# Patient Record
Sex: Female | Born: 1970 | Hispanic: Yes
Health system: Midwestern US, Academic
[De-identification: ages and names within clinical notes are randomized; demographics above are authoritative.]

---

## 2016-06-04 MED ORDER — LEVOTHYROXINE 150 MCG PO TAB
ORAL_TABLET | ORAL | 1 refills | 30.00000 days | Status: AC
Start: 2016-06-04 — End: ?

## 2016-08-07 ENCOUNTER — Encounter: Admit: 2016-08-07 | Discharge: 2016-08-07 | Payer: Medicaid Other

## 2016-08-07 ENCOUNTER — Encounter: Admit: 2016-08-07 | Discharge: 2016-08-07 | Payer: MEDICAID

## 2016-08-07 DIAGNOSIS — S0990XA Unspecified injury of head, initial encounter: ICD-10-CM

## 2016-08-07 DIAGNOSIS — E663 Overweight: ICD-10-CM

## 2016-08-07 DIAGNOSIS — N920 Excessive and frequent menstruation with regular cycle: ICD-10-CM

## 2016-08-07 DIAGNOSIS — M549 Dorsalgia, unspecified: ICD-10-CM

## 2016-08-07 DIAGNOSIS — C801 Malignant (primary) neoplasm, unspecified: ICD-10-CM

## 2016-08-07 DIAGNOSIS — Q0700 Arnold-Chiari syndrome without spina bifida or hydrocephalus: ICD-10-CM

## 2016-08-07 DIAGNOSIS — G935 Compression of brain: ICD-10-CM

## 2016-08-07 DIAGNOSIS — R002 Palpitations: ICD-10-CM

## 2016-08-07 DIAGNOSIS — Z832 Family history of diseases of the blood and blood-forming organs and certain disorders involving the immune mechanism: ICD-10-CM

## 2016-08-07 DIAGNOSIS — R51 Headache: ICD-10-CM

## 2016-08-07 DIAGNOSIS — D699 Hemorrhagic condition, unspecified: Principal | ICD-10-CM

## 2016-08-07 DIAGNOSIS — M797 Fibromyalgia: ICD-10-CM

## 2016-08-07 DIAGNOSIS — D7589 Other specified diseases of blood and blood-forming organs: ICD-10-CM

## 2016-08-07 DIAGNOSIS — I82409 Acute embolism and thrombosis of unspecified deep veins of unspecified lower extremity: ICD-10-CM

## 2016-08-07 DIAGNOSIS — K219 Gastro-esophageal reflux disease without esophagitis: ICD-10-CM

## 2016-08-07 DIAGNOSIS — M479 Spondylosis, unspecified: ICD-10-CM

## 2016-08-07 DIAGNOSIS — R52 Pain, unspecified: Principal | ICD-10-CM

## 2016-08-07 DIAGNOSIS — G43909 Migraine, unspecified, not intractable, without status migrainosus: ICD-10-CM

## 2016-08-07 DIAGNOSIS — R079 Chest pain, unspecified: ICD-10-CM

## 2016-08-07 DIAGNOSIS — J45909 Unspecified asthma, uncomplicated: ICD-10-CM

## 2016-08-07 DIAGNOSIS — F419 Anxiety disorder, unspecified: ICD-10-CM

## 2016-08-07 DIAGNOSIS — M1990 Unspecified osteoarthritis, unspecified site: ICD-10-CM

## 2016-08-07 LAB — COMPREHENSIVE METABOLIC PANEL
Lab: 0.4 mg/dL (ref 0.3–1.2)
Lab: 0.8 mg/dL (ref 0.4–1.00)
Lab: 10 mg/dL (ref 8.5–10.6)
Lab: 106 MMOL/L (ref 98–110)
Lab: 13 mg/dL (ref 7–25)
Lab: 14 U/L (ref 7–56)
Lab: 140 MMOL/L (ref 137–147)
Lab: 18 U/L (ref 7–40)
Lab: 29 MMOL/L (ref 21–30)
Lab: 3.6 MMOL/L (ref 3.5–5.1)
Lab: 4.3 g/dL (ref 3.5–5.0)
Lab: 5 10*3/uL (ref 3–12)
Lab: 66 U/L — ABNORMAL HIGH (ref 25–110)
Lab: 83 mg/dL (ref 70–100)
Lab: >60 mL/min (ref >60)
Lab: >60 mL/min — ABNORMAL HIGH (ref >60)

## 2016-08-07 LAB — FIBRINOGEN: Lab: 355 mg/dL (ref 200–400)

## 2016-08-07 LAB — CBC AND DIFF
Lab: 13 g/dL (ref 12.0–15.0)
Lab: 4.6 M/UL (ref 4.0–5.0)
Lab: 8.6 10*3/uL (ref 4.5–11.0)

## 2016-08-07 NOTE — Progress Notes
Name: Tina [name]          MRN: [id number]      DOB: Apr 20, 1970      AGE: 46 y.o.   DATE OF SERVICE: 08/07/2016    Subjective:             Reason for Visit:  Heme/Onc Care      Tina [name] is a 46 y.o. female.     Cancer Staging  No matching staging information was found for the patient.    History of Present Illness  Patient with possible bleeding disorder.  The PT, PTT levels done at the outside lab were within normal range.  Fibrinogen was raised. Screening for von Willebrand'sdisease and factor VIII activity are normal as is the platelet functional assay.  In addition, based on prior evaluation of antibodies to EBV that were positive and based on her current symptoms, we did check the EBV DNA activity and this is negative.  Comes today to make further determinations to evaluate for bleeding disorder.  ???       Review of Systems  GENERAL: No fevers, chills or night sweats.  Good appetite, no weight loss.  HEENT: No mouth sores ulcers or pain or problems with swallowing.  RESPIRATORY: No cough or chest wall pain or hemoptysis. Has asthma - controlled  CVS: No angina or orthopnea or PND.  No significant exertional dyspnea and no palpitations.  GI: No nausea or vomiting, diarrhea or constipation, melena or hematochezia or abdominal pain   GU: No burning on micturition, frequency or hematuria  MUSCULOSKELETAL: Various muscle aches and pains, spasms.  INTEGUMENT: Has problems with repeated episodes of bruising and brings pictures of these bruises that she has taken on the phone. Has not had any development of rashes.  CNS: Does have headaches.  Has had surgery for Budd-Chiari syndrome  ADDITIONAL: She has a significant history of in breathing with family and apparently mother and father with first cousins.  She has episodes of repeated epistaxes.  Does not have any significant gum bleeding or bleeding from any other sites such as the gastrointestinal tract or vaginal bleeding.  The extensive bruising episodes that she has on unprovoked.    Objective:         ??? baclofen (LIORESAL) 20 mg tablet Take 20 mg by mouth three times daily.   ??? carvedilol (COREG) 6.25 mg tablet Take 6.25 mg by mouth twice daily with meals. Take with food.   ??? COQ10 (UBIQUINOL) PO Take  by mouth daily.   ??? Diethylpropion 75 mg TbER Take 1 tablet by mouth daily.   ??? EPINEPHrine(+) (EPIPEN) 1 mg/mL injection pen Inject 0.3 mg to area(s) as directed once as needed.   ??? furosemide (LASIX) 40 mg tablet Take 20 mg by mouth twice daily.   ??? hydroCHLOROthiazide (HYDRODIURIL) 25 mg tablet Take 25 mg by mouth every morning.   ??? IBUPROFEN PO Take  by mouth as Needed.   ??? irbesartan (AVAPRO) 300 mg tablet Take 300 mg by mouth at bedtime daily.   ??? KRILL/OM3/DHA/EPA/OM6/LIP/ASTX (KRILL OIL (OMEGA 3 AND 6) PO) Take  by mouth daily.   ??? levothyroxine (SYNTHROID) 150 mcg tablet TAKE 1 TABLET BY MOUTH 6 TIMES PER WEEK TAKE ON EMPTY STOMACH NOT WITH MEDS/FOOD/DRINK   ??? metFORMIN (GLUCOPHAGE) 500 mg tablet Take 500 mg by mouth twice daily with meals.   ??? MULTIVITAMIN PO Take  by mouth daily.   ??? naproxen (NAPROSYN) 500 mg tablet Take 500  mg by mouth at bedtime daily. Take with food.   ??? ondansetron (ZOFRAN ODT) 8 mg rapid dissolve tablet Dissolve 8 mg by mouth as Needed for Nausea or Vomiting. Place on tongue to disolve.   ??? promethazine (PHENERGAN) 25 mg tablet Take 25 mg by mouth every 6 hours as needed for Nausea or Vomiting.   ??? simvastatin (ZOCOR) 40 mg tablet Take 40 mg by mouth at bedtime daily.   ??? tamsulosin (FLOMAX) 0.4 mg capsule Take 0.4 mg by mouth daily. Do not crush, chew or open capsules. Take 30 minutes following the same meal each day.   ??? VENTOLIN HFA 90 mcg/actuation inhaler Take 2 puffs by mouth as Needed.     Vitals:    08/07/16 1421   BP: 110/74   Pulse: 65   Resp: 16   Temp: 36.4 ???C (97.5 ???F)   TempSrc: Oral   SpO2: 96%   Weight: 115.6 kg (254 lb 12.8 oz)   Height: 170.2 cm (67) Body mass index is 39.91 kg/m???.     Pain Score: Zero         Pain Addressed:  N/A    Patient Evaluated for a Clinical Trial: No treatment clinical trial available for this patient.     [place] Cooperative Oncology Group performance status is 1, Restricted in physically strenuous activity but ambulatory and able to carry out work of a light or sedentary nature, e.g., light house work, office work.       Physical Exam    GENERAL: Obese, relatively well-appearing patient, no cyanosis, clubbing, edema or jaundice.  HEENT: Mouth and throat are clear, trachea is central no thyromegaly or JVD.  CHEST: Good breath sounds, no crackles, effusions or bronchospasm.  No pleural rub.  CARDIOVASCULAR: No murmurs, rubs or gallops.  Normal heart sounds.  ABDOMEN: No hepatosplenomegaly or tenderness or guarding or other masses.  INTEGUMENTARY: No skin lesions noted.  Has a significant ecchymoses of the right palm.  Has tattoos on the back.  LYMPHOID SYSTEM: No lymphadenopathy in any of the lymph node bearing areas.  CNS: No obvious cranial nerve lesions, she is fully orientated and moves about normally and gets up and down off the exam table with ease.  MUSCULOSKELETAL: No obvious abnormalities noted            Assessment and Plan:  46 year old woman with history of unprovoked ecchymoses/bruising.  She has a strong history of in breathing within the family.  By history she claims that her father and one other family members on the father's had increased bleeding propensity problem does not know anything further about this.  We will plan to send citrated blood for studies of coagulation factors.. It is possible that she has 1 of the rare coagulation factors abnormalities.

## 2016-08-08 LAB — FACTOR 2 ASSAY: Lab: 122 % (ref 50–150)

## 2016-08-08 LAB — FACTOR 5 ASSAY: Lab: 133 % (ref 50–150)

## 2016-08-08 LAB — FACTOR 9 ASSAY: Lab: 120 % (ref 50–150)

## 2016-08-08 LAB — FACTOR 7 ASSAY: Lab: 134 % (ref 50–150)

## 2016-08-08 LAB — FACTOR 11 ASSAY: Lab: 123 % (ref 50–150)

## 2016-08-08 LAB — FACTOR 10 ASSAY: Lab: 127 % — ABNORMAL LOW (ref >60)

## 2016-08-08 LAB — FACTOR 8 ASSAY: Lab: 133 % (ref 50–150)

## 2016-08-09 LAB — MISC REFERENCE TEST

## 2016-08-09 LAB — ALPHA2 ANTIPLASMIN

## 2016-08-13 LAB — MISC ARUP TEST: Lab: 9

## 2016-08-14 LAB — FACTOR 13 ANTIGEN: Lab: 122 % (ref >60)

## 2016-08-15 LAB — MISC ARUP TEST: Lab: 9

## 2016-09-29 ENCOUNTER — Encounter: Admit: 2016-09-29 | Discharge: 2016-09-29 | Payer: Medicaid Other

## 2016-10-22 ENCOUNTER — Encounter: Admit: 2016-10-22 | Discharge: 2016-10-22 | Payer: Medicaid Other

## 2016-10-22 DIAGNOSIS — R238 Other skin changes: Principal | ICD-10-CM

## 2016-10-23 ENCOUNTER — Encounter: Admit: 2016-10-23 | Discharge: 2016-10-23 | Payer: Medicaid Other

## 2016-10-23 ENCOUNTER — Encounter: Admit: 2016-10-23 | Discharge: 2016-10-23 | Payer: MEDICAID

## 2016-10-23 DIAGNOSIS — M1990 Unspecified osteoarthritis, unspecified site: ICD-10-CM

## 2016-10-23 DIAGNOSIS — R52 Pain, unspecified: Principal | ICD-10-CM

## 2016-10-23 DIAGNOSIS — M479 Spondylosis, unspecified: ICD-10-CM

## 2016-10-23 DIAGNOSIS — R079 Chest pain, unspecified: ICD-10-CM

## 2016-10-23 DIAGNOSIS — E6609 Other obesity due to excess calories: ICD-10-CM

## 2016-10-23 DIAGNOSIS — G935 Compression of brain: ICD-10-CM

## 2016-10-23 DIAGNOSIS — N920 Excessive and frequent menstruation with regular cycle: ICD-10-CM

## 2016-10-23 DIAGNOSIS — F419 Anxiety disorder, unspecified: ICD-10-CM

## 2016-10-23 DIAGNOSIS — G43909 Migraine, unspecified, not intractable, without status migrainosus: ICD-10-CM

## 2016-10-23 DIAGNOSIS — Z862 Personal history of diseases of the blood and blood-forming organs and certain disorders involving the immune mechanism: Principal | ICD-10-CM

## 2016-10-23 DIAGNOSIS — Z6836 Body mass index (BMI) 36.0-36.9, adult: ICD-10-CM

## 2016-10-23 DIAGNOSIS — M549 Dorsalgia, unspecified: ICD-10-CM

## 2016-10-23 DIAGNOSIS — C801 Malignant (primary) neoplasm, unspecified: ICD-10-CM

## 2016-10-23 DIAGNOSIS — E663 Overweight: ICD-10-CM

## 2016-10-23 DIAGNOSIS — R002 Palpitations: ICD-10-CM

## 2016-10-23 DIAGNOSIS — Q0700 Arnold-Chiari syndrome without spina bifida or hydrocephalus: ICD-10-CM

## 2016-10-23 DIAGNOSIS — R238 Other skin changes: ICD-10-CM

## 2016-10-23 DIAGNOSIS — R51 Headache: ICD-10-CM

## 2016-10-23 DIAGNOSIS — K219 Gastro-esophageal reflux disease without esophagitis: ICD-10-CM

## 2016-10-23 DIAGNOSIS — D699 Hemorrhagic condition, unspecified: ICD-10-CM

## 2016-10-23 DIAGNOSIS — M797 Fibromyalgia: ICD-10-CM

## 2016-10-23 DIAGNOSIS — S0990XA Unspecified injury of head, initial encounter: ICD-10-CM

## 2016-10-23 DIAGNOSIS — I82409 Acute embolism and thrombosis of unspecified deep veins of unspecified lower extremity: ICD-10-CM

## 2016-10-23 DIAGNOSIS — J45909 Unspecified asthma, uncomplicated: ICD-10-CM

## 2016-10-23 LAB — CBC AND DIFF
Lab: 0.1 10*3/uL (ref 0–0.20)
Lab: 5 M/UL — ABNORMAL HIGH (ref 4.0–5.0)
Lab: 9.6 10*3/uL (ref 4.5–11.0)

## 2016-10-23 LAB — COMPREHENSIVE METABOLIC PANEL
Lab: 0.6 mg/dL (ref 0.3–1.2)
Lab: 0.9 mg/dL (ref 0.4–1.00)
Lab: 10 mg/dL (ref 8.5–10.6)
Lab: 137 MMOL/L (ref 137–147)
Lab: 15 mg/dL (ref 7–25)
Lab: 3.4 MMOL/L — ABNORMAL LOW (ref 3.5–5.1)
Lab: 4.5 g/dL (ref 3.5–5.0)
Lab: 68 U/L (ref 25–110)
Lab: 73 mg/dL (ref 70–100)
Lab: 8 g/dL (ref 6.0–8.0)
Lab: 96 MMOL/L — ABNORMAL LOW (ref 98–110)

## 2016-10-23 NOTE — Progress Notes
Name: Tina [name]          MRN: [id number]      DOB: 1970/03/11      AGE: 46 y.o.   DATE OF SERVICE: 10/23/2016    Subjective:             Reason for Visit:  Heme/Onc Care      Tina [name] is a 46 y.o. female.     Cancer Staging  No matching staging information was found for the patient.    History of Present Illness  Patient with possible bleeding disorder. ???The PT,???PTT levels done at the outside lab were within normal range. ???Fibrinogen was raised. Screening for von Willebrand's 00  disease???and factor VIII activity have been normal as was the platelet functional assay. ???In addition, based on prior evaluation of antibodies to EBV that were positive and based on her current symptoms, we did check the EBV DNA activity and this was negative. ???Comes today with no new bleeding problems since last seen.  Is attempting to lose weight on the weight loss program.???  ???Results for Tina [name], Tina [name] (MRN [id number]) as of 10/29/2016 15:02   Ref. Range 08/07/2016 15:19 08/07/2016 15:19 08/07/2016 15:33   Fibrinogen Latest Ref Range: 200 - 400 MG/DL   629   Alpha 2-Antiplasmin Latest Ref Range: 80 - 130 % SEE REF LAB RESULT     Factor 2 Latest Ref Range: 50 - 150 %   122   Factor 5 Latest Ref Range: 50 - 150 %   133   Factor 7 Latest Ref Range: 50 - 150 %   134   Factor 8 Latest Ref Range: 50 - 150 %   133   Factor 9 Latest Ref Range: 50 - 150 %   120   Factor 10 Latest Ref Range: 50 - 150 %   127   Factor 11 Latest Ref Range: 50 - 150 % 123     Factor 13 Antigen Latest Ref Range: 62 - 157 %   122.3          Review of Systems  GENERAL: No fevers, chills or night sweats.  Good appetite, lost 30 lbs weight on weight loss program.  She has been offered a gastric sleeve procedure for weight loss.  She has not noted any new bleeding episodes since the last visit.  HEENT: No mouth sores ulcers or pain or problems with swallowing.  RESPIRATORY: No cough or chest wall pain or asthma type symptoms or hemoptysis. CVS: No angina or orthopnea or PND.  No significant exertional dyspnea and no palpitations.  GI: No nausea or vomiting, diarrhea or constipation, melena or hematochezia or abdominal pain   INTEGUMENT:  not had development of rashes.  CNS: No headaches or visual changes any positive symptoms.          Objective:         ??? EPINEPHrine(+) (EPIPEN) 1 mg/mL injection pen Inject 0.3 mg to area(s) as directed once as needed.   ??? furosemide (LASIX) 40 mg tablet Take 20 mg by mouth twice daily.   ??? levothyroxine (SYNTHROID) 150 mcg tablet TAKE 1 TABLET BY MOUTH 6 TIMES PER WEEK TAKE ON EMPTY STOMACH NOT WITH MEDS/FOOD/DRINK   ??? metFORMIN (GLUCOPHAGE) 500 mg tablet Take 500 mg by mouth twice daily with meals.   ??? ondansetron (ZOFRAN ODT) 8 mg rapid dissolve tablet Dissolve 8 mg by mouth as Needed for Nausea or Vomiting. Place on tongue to disolve.   ???  VENTOLIN HFA 90 mcg/actuation inhaler Take 2 puffs by mouth as Needed.     Vitals:    10/23/16 1509   BP: 118/67   Pulse: 85   Resp: 18   Temp: 36.7 ???C (98 ???F)   TempSrc: Oral   SpO2: 94%   Weight: 105.4 kg (232 lb 6.4 oz)   Height: 170.2 cm (67)     Body mass index is 36.4 kg/m???.     Pain Score: Zero         Pain Addressed:  N/A    Patient Evaluated for a Clinical Trial: No treatment clinical trial available for this patient.     [place] Cooperative Oncology Group performance status is 0, Fully active, able to carry on all pre-disease performance without restriction.Marland Kitchen       Physical Exam    GENERAL: Well-appearing patient, no cyanosis, clubbing, edema or jaundice. Obese.  HEENT: Mouth and throat are clear, trachea is central no thyromegaly or JVD.  CHEST: Good breath sounds, no crackles, effusions or bronchospasm.  No pleural rub.  CARDIOVASCULAR: No murmurs, rubs or gallops.  Normal heart sounds.  ABDOMEN: No hepatosplenomegaly or tenderness or guarding or other masses.  INTEGUMENTARY: No skin lesions noted. LYMPHOID SYSTEM: No lymphadenopathy in any of the lymph node bearing areas.  CNS: No obvious cranial nerve lesions, he is fully orientated and moves about normally and gets up and down off the exam table with ease.  MUSCULOSKELETAL: No obvious changes       Results for Tina, [name] (MRN [id number]) as of 10/23/2016 15:37   Ref. Range 08/07/2016 13:52 08/07/2016 15:19 08/07/2016 15:19 08/07/2016 15:33 10/23/2016 14:13   Hemoglobin Latest Ref Range: 12.0 - 15.0 GM/DL 45.4    09.8   Hematocrit Latest Ref Range: 36 - 45 % 39.5    42.6   Platelet Count Latest Ref Range: 150 - 400 K/UL 273    313   White Blood Cells Latest Ref Range: 4.5 - 11.0 K/UL 8.6    9.6   Neutrophils Latest Ref Range: 41 - 77 % 54    60   Absolute Neutrophil Count Latest Ref Range: 1.8 - 7.0 K/UL 4.70    5.80   Lymphocytes Latest Ref Range: 24 - 44 % 34    30   Absolute Lymph Count Latest Ref Range: 1.0 - 4.8 K/UL 2.90    2.90   Monocytes Latest Ref Range: 4 - 12 % 5    6   Absolute Monocyte Count Latest Ref Range: 0 - 0.80 K/UL 0.50    0.60   Eosinophils Latest Ref Range: 0 - 5 % 6 (H)    3   Absolute Eosinophil Count Latest Ref Range: 0 - 0.45 K/UL 0.50 (H)    0.20   Absolute Basophil Count Latest Ref Range: 0 - 0.20 K/UL 0.10    0.10   Basophils Latest Ref Range: 0 - 2 % 1    1   RBC Latest Ref Range: 4.0 - 5.0 M/UL 4.64    5.04 (H)   MCV Latest Ref Range: 80 - 100 FL 85.0    84.5   MCH Latest Ref Range: 26 - 34 PG 29.4    29.7   MCHC Latest Ref Range: 32.0 - 36.0 G/DL 11.9    14.7   MPV Latest Ref Range: 7 - 11 FL 8.3    8.4   RDW Latest Ref Range: 11 - 15 % 13.5    15.0  Fibrinogen Latest Ref Range: 200 - 400 MG/DL    213    Alpha 2-Antiplasmin Latest Ref Range: 80 - 130 %  SEE REF LAB RESULT      Factor 2 Latest Ref Range: 50 - 150 %    122    Factor 5 Latest Ref Range: 50 - 150 %    133    Factor 7 Latest Ref Range: 50 - 150 %    134    Factor 8 Latest Ref Range: 50 - 150 %    133    Factor 9 Latest Ref Range: 50 - 150 %    120 Factor 10 Latest Ref Range: 50 - 150 %    127    Factor 11 Latest Ref Range: 50 - 150 %  123      Factor 13 Antigen Latest Ref Range: 62 - 157 %    122.3    Sodium Latest Ref Range: 137 - 147 MMOL/L 140    137   Potassium Latest Ref Range: 3.5 - 5.1 MMOL/L 3.6    3.4 (L)   Chloride Latest Ref Range: 98 - 110 MMOL/L 106    96 (L)   CO2 Latest Ref Range: 21 - 30 MMOL/L 29    30   Anion Gap Latest Ref Range: 3 - 12  5    11    Blood Urea Nitrogen Latest Ref Range: 7 - 25 MG/DL 13    15   Creatinine Latest Ref Range: 0.4 - 1.00 MG/DL 0.86    5.78   eGFR Non African American Latest Ref Range: >60 mL/min >60    >60   eGFR African American Latest Ref Range: >60 mL/min >60    >60   Glucose Latest Ref Range: 70 - 100 MG/DL 83    73   Albumin Latest Ref Range: 3.5 - 5.0 G/DL 4.3    4.5   Calcium Latest Ref Range: 8.5 - 10.6 MG/DL 46.9    62.9   Total Bilirubin Latest Ref Range: 0.3 - 1.2 MG/DL 0.4    0.6   Total Protein Latest Ref Range: 6.0 - 8.0 G/DL 7.5    8.0   AST (SGOT) Latest Ref Range: 7 - 40 U/L 18    19   ALT (SGPT) Latest Ref Range: 7 - 56 U/L 14    15   Alk Phosphatase Latest Ref Range: 25 - 110 U/L 66    68        Assessment and Plan:  Patient with the bleeding history although there is no clear family history obtainable.  At this time, since they have not been any new bleeding episodes and up till this evaluation labs for various clotting factors are all within the normal range, will elect to merely observe her and should she have further episodes of bleeding we will have to evaluate the fibrinolytic pathway and other less common pathways including platelets aggregation studies.  Patient understands and I did not give her a return appointment unless she has development of new bleeding problems.

## 2016-12-09 ENCOUNTER — Encounter: Admit: 2016-12-09 | Discharge: 2016-12-09 | Payer: Medicaid Other

## 2016-12-09 DIAGNOSIS — E039 Hypothyroidism, unspecified: Principal | ICD-10-CM

## 2016-12-10 MED ORDER — LEVOTHYROXINE 150 MCG PO TAB
ORAL_TABLET | 1 refills
Start: 2016-12-10 — End: ?

## 2016-12-12 ENCOUNTER — Encounter: Admit: 2016-12-12 | Discharge: 2016-12-12 | Payer: Medicaid Other

## 2016-12-12 DIAGNOSIS — E039 Hypothyroidism, unspecified: Principal | ICD-10-CM

## 2016-12-13 MED ORDER — LEVOTHYROXINE 150 MCG PO TAB
ORAL_TABLET | 1 refills
Start: 2016-12-13 — End: ?

## 2016-12-31 ENCOUNTER — Encounter: Admit: 2016-12-31 | Discharge: 2016-12-31 | Payer: Medicaid Other

## 2017-05-16 ENCOUNTER — Encounter: Admit: 2017-05-16 | Discharge: 2017-05-16 | Payer: Medicaid Other

## 2017-06-06 ENCOUNTER — Encounter: Admit: 2017-06-06 | Discharge: 2017-06-06 | Payer: Medicaid Other

## 2017-06-06 DIAGNOSIS — G43909 Migraine, unspecified, not intractable, without status migrainosus: ICD-10-CM

## 2017-06-06 DIAGNOSIS — M549 Dorsalgia, unspecified: ICD-10-CM

## 2017-06-06 DIAGNOSIS — I82409 Acute embolism and thrombosis of unspecified deep veins of unspecified lower extremity: ICD-10-CM

## 2017-06-06 DIAGNOSIS — S0990XA Unspecified injury of head, initial encounter: ICD-10-CM

## 2017-06-06 DIAGNOSIS — K219 Gastro-esophageal reflux disease without esophagitis: ICD-10-CM

## 2017-06-06 DIAGNOSIS — N920 Excessive and frequent menstruation with regular cycle: ICD-10-CM

## 2017-06-06 DIAGNOSIS — M1990 Unspecified osteoarthritis, unspecified site: ICD-10-CM

## 2017-06-06 DIAGNOSIS — M797 Fibromyalgia: ICD-10-CM

## 2017-06-06 DIAGNOSIS — R51 Headache: ICD-10-CM

## 2017-06-06 DIAGNOSIS — G935 Compression of brain: ICD-10-CM

## 2017-06-06 DIAGNOSIS — R079 Chest pain, unspecified: ICD-10-CM

## 2017-06-06 DIAGNOSIS — R52 Pain, unspecified: Principal | ICD-10-CM

## 2017-06-06 DIAGNOSIS — F419 Anxiety disorder, unspecified: ICD-10-CM

## 2017-06-06 DIAGNOSIS — C801 Malignant (primary) neoplasm, unspecified: ICD-10-CM

## 2017-06-06 DIAGNOSIS — J45909 Unspecified asthma, uncomplicated: ICD-10-CM

## 2017-06-06 DIAGNOSIS — M479 Spondylosis, unspecified: ICD-10-CM

## 2017-06-06 DIAGNOSIS — E663 Overweight: ICD-10-CM

## 2017-06-06 DIAGNOSIS — Q0700 Arnold-Chiari syndrome without spina bifida or hydrocephalus: ICD-10-CM

## 2017-06-06 DIAGNOSIS — R002 Palpitations: ICD-10-CM

## 2017-06-25 ENCOUNTER — Ambulatory Visit: Admit: 2017-06-25 | Discharge: 2017-06-25 | Payer: Medicaid Other

## 2017-06-25 ENCOUNTER — Encounter: Admit: 2017-06-25 | Discharge: 2017-06-25 | Payer: Medicaid Other

## 2017-06-25 DIAGNOSIS — C801 Malignant (primary) neoplasm, unspecified: ICD-10-CM

## 2017-06-25 DIAGNOSIS — G43909 Migraine, unspecified, not intractable, without status migrainosus: ICD-10-CM

## 2017-06-25 DIAGNOSIS — R079 Chest pain, unspecified: ICD-10-CM

## 2017-06-25 DIAGNOSIS — G935 Compression of brain: ICD-10-CM

## 2017-06-25 DIAGNOSIS — E663 Overweight: ICD-10-CM

## 2017-06-25 DIAGNOSIS — M549 Dorsalgia, unspecified: ICD-10-CM

## 2017-06-25 DIAGNOSIS — R51 Headache: ICD-10-CM

## 2017-06-25 DIAGNOSIS — N920 Excessive and frequent menstruation with regular cycle: ICD-10-CM

## 2017-06-25 DIAGNOSIS — M1990 Unspecified osteoarthritis, unspecified site: ICD-10-CM

## 2017-06-25 DIAGNOSIS — I82409 Acute embolism and thrombosis of unspecified deep veins of unspecified lower extremity: ICD-10-CM

## 2017-06-25 DIAGNOSIS — K219 Gastro-esophageal reflux disease without esophagitis: ICD-10-CM

## 2017-06-25 DIAGNOSIS — R52 Pain, unspecified: Principal | ICD-10-CM

## 2017-06-25 DIAGNOSIS — Q0700 Arnold-Chiari syndrome without spina bifida or hydrocephalus: ICD-10-CM

## 2017-06-25 DIAGNOSIS — J45909 Unspecified asthma, uncomplicated: ICD-10-CM

## 2017-06-25 DIAGNOSIS — F419 Anxiety disorder, unspecified: ICD-10-CM

## 2017-06-25 DIAGNOSIS — R002 Palpitations: ICD-10-CM

## 2017-06-25 DIAGNOSIS — M797 Fibromyalgia: ICD-10-CM

## 2017-06-25 DIAGNOSIS — E890 Postprocedural hypothyroidism: Principal | ICD-10-CM

## 2017-06-25 DIAGNOSIS — M479 Spondylosis, unspecified: ICD-10-CM

## 2017-06-25 DIAGNOSIS — S0990XA Unspecified injury of head, initial encounter: ICD-10-CM

## 2017-06-25 LAB — COMPREHENSIVE METABOLIC PANEL
Lab: 0.5 mg/dL (ref 0.3–1.2)
Lab: 0.7 mg/dL (ref 0.4–1.00)
Lab: 139 MMOL/L (ref 137–147)
Lab: 19 U/L (ref 7–56)
Lab: 23 U/L (ref 7–40)
Lab: 3.3 MMOL/L — ABNORMAL LOW (ref 3.5–5.1)
Lab: 30 MMOL/L (ref 21–30)
Lab: 4.5 g/dL (ref 3.5–5.0)
Lab: 7.5 g/dL (ref 6.0–8.0)
Lab: 73 U/L (ref 25–110)
Lab: 85 mg/dL (ref 70–100)
Lab: 9.9 mg/dL (ref 8.5–10.6)
Lab: >60 mL/min (ref >60)
Lab: >60 mL/min (ref >60)
Lab: 7 (ref 3–12)

## 2017-06-25 LAB — FREE T4 (FREE THYROXINE) ONLY: Lab: 1.4 ng/dL (ref 0.6–1.6)

## 2017-06-25 LAB — THYROID STIMULATING HORMONE-TSH: Lab: 0.8 uU/mL (ref 0.35–5.00)

## 2017-06-26 LAB — 25-OH VITAMIN D (D2 + D3): Lab: 33 ng/mL (ref 30–80)

## 2017-06-27 ENCOUNTER — Encounter: Admit: 2017-06-27 | Discharge: 2017-06-27 | Payer: Medicaid Other

## 2017-07-10 ENCOUNTER — Ambulatory Visit: Admit: 2017-07-10 | Discharge: 2017-07-10 | Payer: Medicaid Other

## 2017-07-10 ENCOUNTER — Encounter: Admit: 2017-07-10 | Discharge: 2017-07-10 | Payer: Medicaid Other

## 2017-07-10 DIAGNOSIS — M1990 Unspecified osteoarthritis, unspecified site: ICD-10-CM

## 2017-07-10 DIAGNOSIS — R002 Palpitations: ICD-10-CM

## 2017-07-10 DIAGNOSIS — S0990XA Unspecified injury of head, initial encounter: ICD-10-CM

## 2017-07-10 DIAGNOSIS — R079 Chest pain, unspecified: ICD-10-CM

## 2017-07-10 DIAGNOSIS — R52 Pain, unspecified: Principal | ICD-10-CM

## 2017-07-10 DIAGNOSIS — F419 Anxiety disorder, unspecified: ICD-10-CM

## 2017-07-10 DIAGNOSIS — I82409 Acute embolism and thrombosis of unspecified deep veins of unspecified lower extremity: ICD-10-CM

## 2017-07-10 DIAGNOSIS — H903 Sensorineural hearing loss, bilateral: Principal | ICD-10-CM

## 2017-07-10 DIAGNOSIS — G43909 Migraine, unspecified, not intractable, without status migrainosus: ICD-10-CM

## 2017-07-10 DIAGNOSIS — K219 Gastro-esophageal reflux disease without esophagitis: ICD-10-CM

## 2017-07-10 DIAGNOSIS — M549 Dorsalgia, unspecified: ICD-10-CM

## 2017-07-10 DIAGNOSIS — J45909 Unspecified asthma, uncomplicated: ICD-10-CM

## 2017-07-10 DIAGNOSIS — R51 Headache: ICD-10-CM

## 2017-07-10 DIAGNOSIS — G935 Compression of brain: ICD-10-CM

## 2017-07-10 DIAGNOSIS — M479 Spondylosis, unspecified: ICD-10-CM

## 2017-07-10 DIAGNOSIS — Q0700 Arnold-Chiari syndrome without spina bifida or hydrocephalus: ICD-10-CM

## 2017-07-10 DIAGNOSIS — C801 Malignant (primary) neoplasm, unspecified: ICD-10-CM

## 2017-07-10 DIAGNOSIS — E663 Overweight: ICD-10-CM

## 2017-07-10 DIAGNOSIS — M797 Fibromyalgia: ICD-10-CM

## 2017-07-10 DIAGNOSIS — N920 Excessive and frequent menstruation with regular cycle: ICD-10-CM

## 2017-07-19 ENCOUNTER — Encounter: Admit: 2017-07-19 | Discharge: 2017-07-19 | Payer: Medicaid Other

## 2017-09-02 ENCOUNTER — Ambulatory Visit: Admit: 2017-09-02 | Discharge: 2017-09-03 | Payer: Medicaid Other

## 2017-09-02 ENCOUNTER — Encounter: Admit: 2017-09-02 | Discharge: 2017-09-02 | Payer: Medicaid Other

## 2017-09-02 DIAGNOSIS — F419 Anxiety disorder, unspecified: ICD-10-CM

## 2017-09-02 DIAGNOSIS — S0990XA Unspecified injury of head, initial encounter: ICD-10-CM

## 2017-09-02 DIAGNOSIS — M479 Spondylosis, unspecified: ICD-10-CM

## 2017-09-02 DIAGNOSIS — R52 Pain, unspecified: Principal | ICD-10-CM

## 2017-09-02 DIAGNOSIS — C801 Malignant (primary) neoplasm, unspecified: ICD-10-CM

## 2017-09-02 DIAGNOSIS — E663 Overweight: ICD-10-CM

## 2017-09-02 DIAGNOSIS — M549 Dorsalgia, unspecified: ICD-10-CM

## 2017-09-02 DIAGNOSIS — I82409 Acute embolism and thrombosis of unspecified deep veins of unspecified lower extremity: ICD-10-CM

## 2017-09-02 DIAGNOSIS — G935 Compression of brain: ICD-10-CM

## 2017-09-02 DIAGNOSIS — R1314 Dysphagia, pharyngoesophageal phase: Principal | ICD-10-CM

## 2017-09-02 DIAGNOSIS — R490 Dysphonia: Secondary | ICD-10-CM

## 2017-09-02 DIAGNOSIS — M1990 Unspecified osteoarthritis, unspecified site: ICD-10-CM

## 2017-09-02 DIAGNOSIS — R51 Headache: ICD-10-CM

## 2017-09-02 DIAGNOSIS — M797 Fibromyalgia: ICD-10-CM

## 2017-09-02 DIAGNOSIS — K219 Gastro-esophageal reflux disease without esophagitis: ICD-10-CM

## 2017-09-02 DIAGNOSIS — R002 Palpitations: ICD-10-CM

## 2017-09-02 DIAGNOSIS — N920 Excessive and frequent menstruation with regular cycle: ICD-10-CM

## 2017-09-02 DIAGNOSIS — J45909 Unspecified asthma, uncomplicated: ICD-10-CM

## 2017-09-02 DIAGNOSIS — G43909 Migraine, unspecified, not intractable, without status migrainosus: ICD-10-CM

## 2017-09-02 DIAGNOSIS — Q0700 Arnold-Chiari syndrome without spina bifida or hydrocephalus: ICD-10-CM

## 2017-09-02 DIAGNOSIS — R079 Chest pain, unspecified: ICD-10-CM

## 2017-09-03 ENCOUNTER — Ambulatory Visit: Admit: 2017-09-02 | Discharge: 2017-09-02 | Payer: Medicaid Other

## 2017-09-03 DIAGNOSIS — J387 Other diseases of larynx: ICD-10-CM

## 2017-09-03 DIAGNOSIS — R490 Dysphonia: ICD-10-CM

## 2017-09-03 DIAGNOSIS — R1314 Dysphagia, pharyngoesophageal phase: Principal | ICD-10-CM

## 2017-09-06 ENCOUNTER — Encounter: Admit: 2017-09-06 | Discharge: 2017-09-06 | Payer: Medicaid Other

## 2017-09-16 ENCOUNTER — Encounter: Admit: 2017-09-16 | Discharge: 2017-09-16 | Payer: Medicaid Other

## 2017-09-16 DIAGNOSIS — R002 Palpitations: ICD-10-CM

## 2017-09-16 DIAGNOSIS — R238 Other skin changes: Principal | ICD-10-CM

## 2017-09-16 DIAGNOSIS — E663 Overweight: ICD-10-CM

## 2017-09-16 DIAGNOSIS — M797 Fibromyalgia: ICD-10-CM

## 2017-09-16 DIAGNOSIS — C801 Malignant (primary) neoplasm, unspecified: ICD-10-CM

## 2017-09-16 DIAGNOSIS — M1990 Unspecified osteoarthritis, unspecified site: ICD-10-CM

## 2017-09-16 DIAGNOSIS — N920 Excessive and frequent menstruation with regular cycle: ICD-10-CM

## 2017-09-16 DIAGNOSIS — J45909 Unspecified asthma, uncomplicated: ICD-10-CM

## 2017-09-16 DIAGNOSIS — K219 Gastro-esophageal reflux disease without esophagitis: ICD-10-CM

## 2017-09-16 DIAGNOSIS — Q0700 Arnold-Chiari syndrome without spina bifida or hydrocephalus: ICD-10-CM

## 2017-09-16 DIAGNOSIS — F419 Anxiety disorder, unspecified: ICD-10-CM

## 2017-09-16 DIAGNOSIS — M549 Dorsalgia, unspecified: ICD-10-CM

## 2017-09-16 DIAGNOSIS — R51 Headache: ICD-10-CM

## 2017-09-16 DIAGNOSIS — M479 Spondylosis, unspecified: ICD-10-CM

## 2017-09-16 DIAGNOSIS — G43909 Migraine, unspecified, not intractable, without status migrainosus: ICD-10-CM

## 2017-09-16 DIAGNOSIS — R52 Pain, unspecified: Principal | ICD-10-CM

## 2017-09-16 DIAGNOSIS — S0990XA Unspecified injury of head, initial encounter: ICD-10-CM

## 2017-09-16 DIAGNOSIS — R079 Chest pain, unspecified: ICD-10-CM

## 2017-09-16 DIAGNOSIS — G935 Compression of brain: ICD-10-CM

## 2017-09-16 DIAGNOSIS — I82409 Acute embolism and thrombosis of unspecified deep veins of unspecified lower extremity: ICD-10-CM

## 2017-09-18 ENCOUNTER — Encounter: Admit: 2017-09-18 | Discharge: 2017-09-18 | Payer: Medicaid Other

## 2017-09-18 DIAGNOSIS — R1314 Dysphagia, pharyngoesophageal phase: Principal | ICD-10-CM

## 2017-09-18 DIAGNOSIS — J387 Other diseases of larynx: ICD-10-CM

## 2017-09-19 ENCOUNTER — Encounter: Admit: 2017-09-19 | Discharge: 2017-09-19 | Payer: Medicaid Other

## 2017-09-19 ENCOUNTER — Ambulatory Visit: Admit: 2017-09-19 | Discharge: 2017-09-20 | Payer: Medicaid Other

## 2017-09-19 ENCOUNTER — Encounter: Admit: 2017-09-19 | Discharge: 2017-10-02 | Payer: Medicaid Other

## 2017-09-19 DIAGNOSIS — G935 Compression of brain: ICD-10-CM

## 2017-09-19 DIAGNOSIS — F909 Attention-deficit hyperactivity disorder, unspecified type: ICD-10-CM

## 2017-09-19 DIAGNOSIS — M2550 Pain in unspecified joint: ICD-10-CM

## 2017-09-19 DIAGNOSIS — E663 Overweight: ICD-10-CM

## 2017-09-19 DIAGNOSIS — R002 Palpitations: ICD-10-CM

## 2017-09-19 DIAGNOSIS — M797 Fibromyalgia: ICD-10-CM

## 2017-09-19 DIAGNOSIS — S0990XA Unspecified injury of head, initial encounter: ICD-10-CM

## 2017-09-19 DIAGNOSIS — J45909 Unspecified asthma, uncomplicated: ICD-10-CM

## 2017-09-19 DIAGNOSIS — IMO0002 Chiari malformation: ICD-10-CM

## 2017-09-19 DIAGNOSIS — R52 Pain, unspecified: Principal | ICD-10-CM

## 2017-09-19 DIAGNOSIS — F329 Major depressive disorder, single episode, unspecified: ICD-10-CM

## 2017-09-19 DIAGNOSIS — G971 Other reaction to spinal and lumbar puncture: ICD-10-CM

## 2017-09-19 DIAGNOSIS — M1990 Unspecified osteoarthritis, unspecified site: ICD-10-CM

## 2017-09-19 DIAGNOSIS — J189 Pneumonia, unspecified organism: ICD-10-CM

## 2017-09-19 DIAGNOSIS — R079 Chest pain, unspecified: ICD-10-CM

## 2017-09-19 DIAGNOSIS — F419 Anxiety disorder, unspecified: ICD-10-CM

## 2017-09-19 DIAGNOSIS — M479 Spondylosis, unspecified: ICD-10-CM

## 2017-09-19 DIAGNOSIS — Q0700 Arnold-Chiari syndrome without spina bifida or hydrocephalus: ICD-10-CM

## 2017-09-19 DIAGNOSIS — G43909 Migraine, unspecified, not intractable, without status migrainosus: ICD-10-CM

## 2017-09-19 DIAGNOSIS — M549 Dorsalgia, unspecified: ICD-10-CM

## 2017-09-19 DIAGNOSIS — R51 Headache: ICD-10-CM

## 2017-09-19 DIAGNOSIS — R011 Cardiac murmur, unspecified: ICD-10-CM

## 2017-09-19 DIAGNOSIS — I82409 Acute embolism and thrombosis of unspecified deep veins of unspecified lower extremity: ICD-10-CM

## 2017-09-19 DIAGNOSIS — E079 Disorder of thyroid, unspecified: ICD-10-CM

## 2017-09-19 DIAGNOSIS — I2699 Other pulmonary embolism without acute cor pulmonale: ICD-10-CM

## 2017-09-19 DIAGNOSIS — K219 Gastro-esophageal reflux disease without esophagitis: ICD-10-CM

## 2017-09-19 DIAGNOSIS — N920 Excessive and frequent menstruation with regular cycle: ICD-10-CM

## 2017-09-19 DIAGNOSIS — R110 Nausea: ICD-10-CM

## 2017-09-19 DIAGNOSIS — C801 Malignant (primary) neoplasm, unspecified: ICD-10-CM

## 2017-09-19 DIAGNOSIS — M5416 Radiculopathy, lumbar region: Secondary | ICD-10-CM

## 2017-09-20 DIAGNOSIS — M5116 Intervertebral disc disorders with radiculopathy, lumbar region: Principal | ICD-10-CM

## 2017-09-22 ENCOUNTER — Encounter: Admit: 2017-09-22 | Discharge: 2017-09-22 | Payer: Medicaid Other

## 2017-10-01 ENCOUNTER — Encounter: Admit: 2017-10-01 | Discharge: 2017-10-01 | Payer: Medicaid Other

## 2017-10-01 DIAGNOSIS — D699 Hemorrhagic condition, unspecified: Principal | ICD-10-CM

## 2017-10-02 ENCOUNTER — Ambulatory Visit: Admit: 2017-10-02 | Discharge: 2017-10-02 | Payer: Medicaid Other

## 2017-10-02 DIAGNOSIS — D699 Hemorrhagic condition, unspecified: Principal | ICD-10-CM

## 2017-10-02 DIAGNOSIS — M5116 Intervertebral disc disorders with radiculopathy, lumbar region: Principal | ICD-10-CM

## 2017-10-02 DIAGNOSIS — M5416 Radiculopathy, lumbar region: ICD-10-CM

## 2017-10-03 LAB — PLATELET AGGREGATION
Lab: 0.8 nmol (ref >0.63)
Lab: 96 % (ref 45–100)

## 2017-10-04 ENCOUNTER — Encounter: Admit: 2017-10-04 | Discharge: 2017-10-04 | Payer: Medicaid Other

## 2017-10-14 ENCOUNTER — Encounter: Admit: 2017-10-14 | Discharge: 2017-10-14 | Payer: Medicaid Other

## 2017-10-14 DIAGNOSIS — M5416 Radiculopathy, lumbar region: Principal | ICD-10-CM

## 2017-10-16 ENCOUNTER — Encounter: Admit: 2017-10-16 | Discharge: 2017-10-16 | Payer: Medicaid Other

## 2017-10-16 ENCOUNTER — Ambulatory Visit: Admit: 2017-10-16 | Discharge: 2017-10-17 | Payer: Medicaid Other

## 2017-10-16 DIAGNOSIS — M797 Fibromyalgia: ICD-10-CM

## 2017-10-16 DIAGNOSIS — R51 Headache: ICD-10-CM

## 2017-10-16 DIAGNOSIS — R079 Chest pain, unspecified: ICD-10-CM

## 2017-10-16 DIAGNOSIS — R110 Nausea: ICD-10-CM

## 2017-10-16 DIAGNOSIS — G971 Other reaction to spinal and lumbar puncture: ICD-10-CM

## 2017-10-16 DIAGNOSIS — S0990XA Unspecified injury of head, initial encounter: ICD-10-CM

## 2017-10-16 DIAGNOSIS — Q0700 Arnold-Chiari syndrome without spina bifida or hydrocephalus: ICD-10-CM

## 2017-10-16 DIAGNOSIS — G935 Compression of brain: ICD-10-CM

## 2017-10-16 DIAGNOSIS — J189 Pneumonia, unspecified organism: ICD-10-CM

## 2017-10-16 DIAGNOSIS — C801 Malignant (primary) neoplasm, unspecified: ICD-10-CM

## 2017-10-16 DIAGNOSIS — M1990 Unspecified osteoarthritis, unspecified site: ICD-10-CM

## 2017-10-16 DIAGNOSIS — R002 Palpitations: ICD-10-CM

## 2017-10-16 DIAGNOSIS — M549 Dorsalgia, unspecified: ICD-10-CM

## 2017-10-16 DIAGNOSIS — I2699 Other pulmonary embolism without acute cor pulmonale: ICD-10-CM

## 2017-10-16 DIAGNOSIS — G43909 Migraine, unspecified, not intractable, without status migrainosus: ICD-10-CM

## 2017-10-16 DIAGNOSIS — J45909 Unspecified asthma, uncomplicated: ICD-10-CM

## 2017-10-16 DIAGNOSIS — F909 Attention-deficit hyperactivity disorder, unspecified type: ICD-10-CM

## 2017-10-16 DIAGNOSIS — R52 Pain, unspecified: Principal | ICD-10-CM

## 2017-10-16 DIAGNOSIS — IMO0002 Chiari malformation: ICD-10-CM

## 2017-10-16 DIAGNOSIS — K219 Gastro-esophageal reflux disease without esophagitis: ICD-10-CM

## 2017-10-16 DIAGNOSIS — R011 Cardiac murmur, unspecified: ICD-10-CM

## 2017-10-16 DIAGNOSIS — E079 Disorder of thyroid, unspecified: ICD-10-CM

## 2017-10-16 DIAGNOSIS — I82409 Acute embolism and thrombosis of unspecified deep veins of unspecified lower extremity: ICD-10-CM

## 2017-10-16 DIAGNOSIS — M2550 Pain in unspecified joint: ICD-10-CM

## 2017-10-16 DIAGNOSIS — F329 Major depressive disorder, single episode, unspecified: ICD-10-CM

## 2017-10-16 DIAGNOSIS — N920 Excessive and frequent menstruation with regular cycle: ICD-10-CM

## 2017-10-16 DIAGNOSIS — F419 Anxiety disorder, unspecified: ICD-10-CM

## 2017-10-16 DIAGNOSIS — M479 Spondylosis, unspecified: ICD-10-CM

## 2017-10-16 DIAGNOSIS — E663 Overweight: ICD-10-CM

## 2017-10-17 ENCOUNTER — Encounter: Admit: 2017-10-17 | Discharge: 2017-10-17 | Payer: Medicaid Other

## 2017-10-17 DIAGNOSIS — F4310 Post-traumatic stress disorder, unspecified: Principal | ICD-10-CM

## 2017-10-17 DIAGNOSIS — G8929 Other chronic pain: ICD-10-CM

## 2017-10-17 DIAGNOSIS — M549 Dorsalgia, unspecified: ICD-10-CM

## 2017-10-21 ENCOUNTER — Encounter: Admit: 2017-10-21 | Discharge: 2017-10-21 | Payer: Medicaid Other

## 2017-10-21 ENCOUNTER — Ambulatory Visit: Admit: 2017-10-21 | Discharge: 2017-10-22 | Payer: Medicaid Other

## 2017-10-21 DIAGNOSIS — Q0700 Arnold-Chiari syndrome without spina bifida or hydrocephalus: ICD-10-CM

## 2017-10-21 DIAGNOSIS — F419 Anxiety disorder, unspecified: ICD-10-CM

## 2017-10-21 DIAGNOSIS — I82409 Acute embolism and thrombosis of unspecified deep veins of unspecified lower extremity: ICD-10-CM

## 2017-10-21 DIAGNOSIS — M2550 Pain in unspecified joint: ICD-10-CM

## 2017-10-21 DIAGNOSIS — E079 Disorder of thyroid, unspecified: ICD-10-CM

## 2017-10-21 DIAGNOSIS — J45909 Unspecified asthma, uncomplicated: ICD-10-CM

## 2017-10-21 DIAGNOSIS — J189 Pneumonia, unspecified organism: ICD-10-CM

## 2017-10-21 DIAGNOSIS — N920 Excessive and frequent menstruation with regular cycle: ICD-10-CM

## 2017-10-21 DIAGNOSIS — G935 Compression of brain: ICD-10-CM

## 2017-10-21 DIAGNOSIS — G971 Other reaction to spinal and lumbar puncture: ICD-10-CM

## 2017-10-21 DIAGNOSIS — K219 Gastro-esophageal reflux disease without esophagitis: ICD-10-CM

## 2017-10-21 DIAGNOSIS — IMO0002 Chiari malformation: ICD-10-CM

## 2017-10-21 DIAGNOSIS — E663 Overweight: ICD-10-CM

## 2017-10-21 DIAGNOSIS — R002 Palpitations: ICD-10-CM

## 2017-10-21 DIAGNOSIS — R51 Headache: ICD-10-CM

## 2017-10-21 DIAGNOSIS — F909 Attention-deficit hyperactivity disorder, unspecified type: ICD-10-CM

## 2017-10-21 DIAGNOSIS — M797 Fibromyalgia: ICD-10-CM

## 2017-10-21 DIAGNOSIS — R52 Pain, unspecified: Principal | ICD-10-CM

## 2017-10-21 DIAGNOSIS — M549 Dorsalgia, unspecified: ICD-10-CM

## 2017-10-21 DIAGNOSIS — S0990XA Unspecified injury of head, initial encounter: ICD-10-CM

## 2017-10-21 DIAGNOSIS — M479 Spondylosis, unspecified: ICD-10-CM

## 2017-10-21 DIAGNOSIS — R079 Chest pain, unspecified: ICD-10-CM

## 2017-10-21 DIAGNOSIS — R011 Cardiac murmur, unspecified: ICD-10-CM

## 2017-10-21 DIAGNOSIS — M1990 Unspecified osteoarthritis, unspecified site: ICD-10-CM

## 2017-10-21 DIAGNOSIS — G43909 Migraine, unspecified, not intractable, without status migrainosus: ICD-10-CM

## 2017-10-21 DIAGNOSIS — R110 Nausea: ICD-10-CM

## 2017-10-21 DIAGNOSIS — C801 Malignant (primary) neoplasm, unspecified: ICD-10-CM

## 2017-10-21 DIAGNOSIS — F329 Major depressive disorder, single episode, unspecified: ICD-10-CM

## 2017-10-21 DIAGNOSIS — I2699 Other pulmonary embolism without acute cor pulmonale: ICD-10-CM

## 2017-10-22 DIAGNOSIS — L570 Actinic keratosis: Principal | ICD-10-CM

## 2017-10-23 ENCOUNTER — Encounter: Admit: 2017-10-23 | Discharge: 2017-10-23 | Payer: Medicaid Other

## 2017-11-07 ENCOUNTER — Encounter: Admit: 2017-11-07 | Discharge: 2017-11-07 | Payer: Medicaid Other

## 2017-11-07 DIAGNOSIS — R079 Chest pain, unspecified: ICD-10-CM

## 2017-11-07 DIAGNOSIS — I2699 Other pulmonary embolism without acute cor pulmonale: ICD-10-CM

## 2017-11-07 DIAGNOSIS — F419 Anxiety disorder, unspecified: ICD-10-CM

## 2017-11-07 DIAGNOSIS — R011 Cardiac murmur, unspecified: ICD-10-CM

## 2017-11-07 DIAGNOSIS — E079 Disorder of thyroid, unspecified: ICD-10-CM

## 2017-11-07 DIAGNOSIS — Q0700 Arnold-Chiari syndrome without spina bifida or hydrocephalus: ICD-10-CM

## 2017-11-07 DIAGNOSIS — S0990XA Unspecified injury of head, initial encounter: ICD-10-CM

## 2017-11-07 DIAGNOSIS — R110 Nausea: ICD-10-CM

## 2017-11-07 DIAGNOSIS — C801 Malignant (primary) neoplasm, unspecified: ICD-10-CM

## 2017-11-07 DIAGNOSIS — G935 Compression of brain: ICD-10-CM

## 2017-11-07 DIAGNOSIS — R52 Pain, unspecified: Principal | ICD-10-CM

## 2017-11-07 DIAGNOSIS — M797 Fibromyalgia: ICD-10-CM

## 2017-11-07 DIAGNOSIS — I82409 Acute embolism and thrombosis of unspecified deep veins of unspecified lower extremity: ICD-10-CM

## 2017-11-07 DIAGNOSIS — R51 Headache: ICD-10-CM

## 2017-11-07 DIAGNOSIS — G971 Other reaction to spinal and lumbar puncture: ICD-10-CM

## 2017-11-07 DIAGNOSIS — J189 Pneumonia, unspecified organism: ICD-10-CM

## 2017-11-07 DIAGNOSIS — G43909 Migraine, unspecified, not intractable, without status migrainosus: ICD-10-CM

## 2017-11-07 DIAGNOSIS — J45909 Unspecified asthma, uncomplicated: ICD-10-CM

## 2017-11-07 DIAGNOSIS — M479 Spondylosis, unspecified: ICD-10-CM

## 2017-11-07 DIAGNOSIS — E663 Overweight: ICD-10-CM

## 2017-11-07 DIAGNOSIS — K219 Gastro-esophageal reflux disease without esophagitis: ICD-10-CM

## 2017-11-07 DIAGNOSIS — F909 Attention-deficit hyperactivity disorder, unspecified type: ICD-10-CM

## 2017-11-07 DIAGNOSIS — R002 Palpitations: ICD-10-CM

## 2017-11-07 DIAGNOSIS — F329 Major depressive disorder, single episode, unspecified: ICD-10-CM

## 2017-11-07 DIAGNOSIS — IMO0002 Chiari malformation: ICD-10-CM

## 2017-11-07 DIAGNOSIS — M2550 Pain in unspecified joint: ICD-10-CM

## 2017-11-07 DIAGNOSIS — N920 Excessive and frequent menstruation with regular cycle: ICD-10-CM

## 2017-11-07 DIAGNOSIS — M549 Dorsalgia, unspecified: ICD-10-CM

## 2017-11-07 DIAGNOSIS — M1990 Unspecified osteoarthritis, unspecified site: ICD-10-CM

## 2017-11-08 ENCOUNTER — Ambulatory Visit: Admit: 2017-11-07 | Discharge: 2017-11-08 | Payer: Medicaid Other

## 2017-11-08 ENCOUNTER — Ambulatory Visit: Admit: 2017-11-08 | Discharge: 2017-11-08 | Payer: Medicaid Other

## 2017-11-08 DIAGNOSIS — M542 Cervicalgia: Principal | ICD-10-CM

## 2017-11-08 DIAGNOSIS — G43009 Migraine without aura, not intractable, without status migrainosus: Secondary | ICD-10-CM

## 2017-12-05 ENCOUNTER — Ambulatory Visit: Admit: 2017-12-05 | Discharge: 2017-12-05 | Payer: Medicaid Other

## 2017-12-05 ENCOUNTER — Encounter: Admit: 2017-12-05 | Discharge: 2017-12-05 | Payer: Medicaid Other

## 2017-12-05 ENCOUNTER — Ambulatory Visit: Admit: 2017-12-05 | Discharge: 2017-12-06 | Payer: Medicaid Other

## 2017-12-05 DIAGNOSIS — R079 Chest pain, unspecified: ICD-10-CM

## 2017-12-05 DIAGNOSIS — C801 Malignant (primary) neoplasm, unspecified: ICD-10-CM

## 2017-12-05 DIAGNOSIS — M1990 Unspecified osteoarthritis, unspecified site: ICD-10-CM

## 2017-12-05 DIAGNOSIS — J45909 Unspecified asthma, uncomplicated: ICD-10-CM

## 2017-12-05 DIAGNOSIS — M479 Spondylosis, unspecified: ICD-10-CM

## 2017-12-05 DIAGNOSIS — G43909 Migraine, unspecified, not intractable, without status migrainosus: ICD-10-CM

## 2017-12-05 DIAGNOSIS — N920 Excessive and frequent menstruation with regular cycle: ICD-10-CM

## 2017-12-05 DIAGNOSIS — F419 Anxiety disorder, unspecified: ICD-10-CM

## 2017-12-05 DIAGNOSIS — S0990XA Unspecified injury of head, initial encounter: ICD-10-CM

## 2017-12-05 DIAGNOSIS — I2699 Other pulmonary embolism without acute cor pulmonale: ICD-10-CM

## 2017-12-05 DIAGNOSIS — R011 Cardiac murmur, unspecified: ICD-10-CM

## 2017-12-05 DIAGNOSIS — M797 Fibromyalgia: ICD-10-CM

## 2017-12-05 DIAGNOSIS — Q0700 Arnold-Chiari syndrome without spina bifida or hydrocephalus: ICD-10-CM

## 2017-12-05 DIAGNOSIS — E079 Disorder of thyroid, unspecified: ICD-10-CM

## 2017-12-05 DIAGNOSIS — G43009 Migraine without aura, not intractable, without status migrainosus: Secondary | ICD-10-CM

## 2017-12-05 DIAGNOSIS — IMO0002 Chiari malformation: ICD-10-CM

## 2017-12-05 DIAGNOSIS — M549 Dorsalgia, unspecified: ICD-10-CM

## 2017-12-05 DIAGNOSIS — I82409 Acute embolism and thrombosis of unspecified deep veins of unspecified lower extremity: ICD-10-CM

## 2017-12-05 DIAGNOSIS — F4310 Post-traumatic stress disorder, unspecified: Principal | ICD-10-CM

## 2017-12-05 DIAGNOSIS — R110 Nausea: ICD-10-CM

## 2017-12-05 DIAGNOSIS — R51 Headache: ICD-10-CM

## 2017-12-05 DIAGNOSIS — J189 Pneumonia, unspecified organism: ICD-10-CM

## 2017-12-05 DIAGNOSIS — R002 Palpitations: ICD-10-CM

## 2017-12-05 DIAGNOSIS — F329 Major depressive disorder, single episode, unspecified: ICD-10-CM

## 2017-12-05 DIAGNOSIS — K219 Gastro-esophageal reflux disease without esophagitis: ICD-10-CM

## 2017-12-05 DIAGNOSIS — M2550 Pain in unspecified joint: ICD-10-CM

## 2017-12-05 DIAGNOSIS — F909 Attention-deficit hyperactivity disorder, unspecified type: ICD-10-CM

## 2017-12-05 DIAGNOSIS — M7918 Myalgia, other site: Secondary | ICD-10-CM

## 2017-12-05 DIAGNOSIS — R52 Pain, unspecified: Principal | ICD-10-CM

## 2017-12-05 DIAGNOSIS — G935 Compression of brain: ICD-10-CM

## 2017-12-05 DIAGNOSIS — E663 Overweight: ICD-10-CM

## 2017-12-05 DIAGNOSIS — G971 Other reaction to spinal and lumbar puncture: ICD-10-CM

## 2017-12-05 MED ORDER — TRIAMCINOLONE ACETONIDE 40 MG/ML IJ SUSP
40 mg | Freq: Once | INTRAMUSCULAR | 0 refills | Status: CP | PRN
Start: 2017-12-05 — End: ?
  Administered 2017-12-05: 16:00:00 40 mg via INTRAMUSCULAR

## 2017-12-05 MED ORDER — BUPIVACAINE (PF) 0.25 % (2.5 MG/ML) IJ SOLN
2 mL | Freq: Once | INTRAMUSCULAR | 0 refills | Status: CP | PRN
Start: 2017-12-05 — End: ?
  Administered 2017-12-05: 16:00:00 2 mL via INTRAMUSCULAR

## 2017-12-06 DIAGNOSIS — G8929 Other chronic pain: ICD-10-CM

## 2017-12-06 DIAGNOSIS — G43009 Migraine without aura, not intractable, without status migrainosus: ICD-10-CM

## 2017-12-06 DIAGNOSIS — M549 Dorsalgia, unspecified: Secondary | ICD-10-CM

## 2017-12-06 DIAGNOSIS — M542 Cervicalgia: ICD-10-CM

## 2017-12-06 DIAGNOSIS — M7918 Myalgia, other site: ICD-10-CM

## 2017-12-06 DIAGNOSIS — F4310 Post-traumatic stress disorder, unspecified: Principal | ICD-10-CM

## 2017-12-19 ENCOUNTER — Encounter: Admit: 2017-12-19 | Discharge: 2017-12-19 | Payer: Medicaid Other

## 2018-01-03 ENCOUNTER — Encounter: Admit: 2018-01-03 | Discharge: 2018-01-03 | Payer: Medicaid Other

## 2018-01-06 ENCOUNTER — Ambulatory Visit: Admit: 2018-01-06 | Discharge: 2018-01-07 | Payer: Medicaid Other

## 2018-01-06 ENCOUNTER — Encounter: Admit: 2018-01-06 | Discharge: 2018-01-06 | Payer: Medicaid Other

## 2018-01-06 DIAGNOSIS — M797 Fibromyalgia: ICD-10-CM

## 2018-01-06 DIAGNOSIS — IMO0002 Chiari malformation: ICD-10-CM

## 2018-01-06 DIAGNOSIS — R51 Headache: ICD-10-CM

## 2018-01-06 DIAGNOSIS — C801 Malignant (primary) neoplasm, unspecified: ICD-10-CM

## 2018-01-06 DIAGNOSIS — M1990 Unspecified osteoarthritis, unspecified site: ICD-10-CM

## 2018-01-06 DIAGNOSIS — N920 Excessive and frequent menstruation with regular cycle: ICD-10-CM

## 2018-01-06 DIAGNOSIS — M479 Spondylosis, unspecified: ICD-10-CM

## 2018-01-06 DIAGNOSIS — F329 Major depressive disorder, single episode, unspecified: ICD-10-CM

## 2018-01-06 DIAGNOSIS — G43909 Migraine, unspecified, not intractable, without status migrainosus: ICD-10-CM

## 2018-01-06 DIAGNOSIS — S0990XA Unspecified injury of head, initial encounter: ICD-10-CM

## 2018-01-06 DIAGNOSIS — Q0700 Arnold-Chiari syndrome without spina bifida or hydrocephalus: ICD-10-CM

## 2018-01-06 DIAGNOSIS — G935 Compression of brain: ICD-10-CM

## 2018-01-06 DIAGNOSIS — R110 Nausea: ICD-10-CM

## 2018-01-06 DIAGNOSIS — J189 Pneumonia, unspecified organism: ICD-10-CM

## 2018-01-06 DIAGNOSIS — R011 Cardiac murmur, unspecified: ICD-10-CM

## 2018-01-06 DIAGNOSIS — R002 Palpitations: ICD-10-CM

## 2018-01-06 DIAGNOSIS — M2550 Pain in unspecified joint: ICD-10-CM

## 2018-01-06 DIAGNOSIS — K219 Gastro-esophageal reflux disease without esophagitis: ICD-10-CM

## 2018-01-06 DIAGNOSIS — M549 Dorsalgia, unspecified: ICD-10-CM

## 2018-01-06 DIAGNOSIS — J45909 Unspecified asthma, uncomplicated: ICD-10-CM

## 2018-01-06 DIAGNOSIS — F909 Attention-deficit hyperactivity disorder, unspecified type: ICD-10-CM

## 2018-01-06 DIAGNOSIS — I82409 Acute embolism and thrombosis of unspecified deep veins of unspecified lower extremity: ICD-10-CM

## 2018-01-06 DIAGNOSIS — R52 Pain, unspecified: Principal | ICD-10-CM

## 2018-01-06 DIAGNOSIS — E079 Disorder of thyroid, unspecified: ICD-10-CM

## 2018-01-06 DIAGNOSIS — R079 Chest pain, unspecified: ICD-10-CM

## 2018-01-06 DIAGNOSIS — I2699 Other pulmonary embolism without acute cor pulmonale: ICD-10-CM

## 2018-01-06 DIAGNOSIS — F419 Anxiety disorder, unspecified: ICD-10-CM

## 2018-01-06 DIAGNOSIS — G971 Other reaction to spinal and lumbar puncture: ICD-10-CM

## 2018-01-06 DIAGNOSIS — E663 Overweight: ICD-10-CM

## 2018-01-07 DIAGNOSIS — R490 Dysphonia: Secondary | ICD-10-CM

## 2018-01-07 DIAGNOSIS — J387 Other diseases of larynx: Principal | ICD-10-CM

## 2018-02-06 ENCOUNTER — Encounter: Admit: 2018-02-06 | Discharge: 2018-02-06 | Payer: Medicaid Other

## 2018-02-06 ENCOUNTER — Ambulatory Visit: Admit: 2018-02-06 | Discharge: 2018-02-07 | Payer: Medicaid Other

## 2018-02-06 DIAGNOSIS — E663 Overweight: ICD-10-CM

## 2018-02-06 DIAGNOSIS — R110 Nausea: ICD-10-CM

## 2018-02-06 DIAGNOSIS — M797 Fibromyalgia: ICD-10-CM

## 2018-02-06 DIAGNOSIS — M549 Dorsalgia, unspecified: ICD-10-CM

## 2018-02-06 DIAGNOSIS — E079 Disorder of thyroid, unspecified: ICD-10-CM

## 2018-02-06 DIAGNOSIS — M479 Spondylosis, unspecified: ICD-10-CM

## 2018-02-06 DIAGNOSIS — C801 Malignant (primary) neoplasm, unspecified: ICD-10-CM

## 2018-02-06 DIAGNOSIS — K219 Gastro-esophageal reflux disease without esophagitis: ICD-10-CM

## 2018-02-06 DIAGNOSIS — M2550 Pain in unspecified joint: ICD-10-CM

## 2018-02-06 DIAGNOSIS — Q0700 Arnold-Chiari syndrome without spina bifida or hydrocephalus: ICD-10-CM

## 2018-02-06 DIAGNOSIS — J45909 Unspecified asthma, uncomplicated: ICD-10-CM

## 2018-02-06 DIAGNOSIS — G971 Other reaction to spinal and lumbar puncture: ICD-10-CM

## 2018-02-06 DIAGNOSIS — F909 Attention-deficit hyperactivity disorder, unspecified type: ICD-10-CM

## 2018-02-06 DIAGNOSIS — F329 Major depressive disorder, single episode, unspecified: ICD-10-CM

## 2018-02-06 DIAGNOSIS — S0990XA Unspecified injury of head, initial encounter: ICD-10-CM

## 2018-02-06 DIAGNOSIS — R011 Cardiac murmur, unspecified: ICD-10-CM

## 2018-02-06 DIAGNOSIS — R079 Chest pain, unspecified: ICD-10-CM

## 2018-02-06 DIAGNOSIS — I82409 Acute embolism and thrombosis of unspecified deep veins of unspecified lower extremity: ICD-10-CM

## 2018-02-06 DIAGNOSIS — G935 Compression of brain: ICD-10-CM

## 2018-02-06 DIAGNOSIS — I2699 Other pulmonary embolism without acute cor pulmonale: ICD-10-CM

## 2018-02-06 DIAGNOSIS — R51 Headache: ICD-10-CM

## 2018-02-06 DIAGNOSIS — R002 Palpitations: ICD-10-CM

## 2018-02-06 DIAGNOSIS — R52 Pain, unspecified: Principal | ICD-10-CM

## 2018-02-06 DIAGNOSIS — J189 Pneumonia, unspecified organism: ICD-10-CM

## 2018-02-06 DIAGNOSIS — G43909 Migraine, unspecified, not intractable, without status migrainosus: ICD-10-CM

## 2018-02-06 DIAGNOSIS — M7918 Myalgia, other site: Principal | ICD-10-CM

## 2018-02-06 DIAGNOSIS — G43009 Migraine without aura, not intractable, without status migrainosus: Secondary | ICD-10-CM

## 2018-02-06 DIAGNOSIS — F419 Anxiety disorder, unspecified: ICD-10-CM

## 2018-02-06 DIAGNOSIS — M1990 Unspecified osteoarthritis, unspecified site: ICD-10-CM

## 2018-02-06 DIAGNOSIS — IMO0002 Chiari malformation: ICD-10-CM

## 2018-02-06 DIAGNOSIS — N920 Excessive and frequent menstruation with regular cycle: ICD-10-CM

## 2018-02-07 DIAGNOSIS — M7918 Myalgia, other site: ICD-10-CM

## 2018-02-07 DIAGNOSIS — M542 Cervicalgia: Principal | ICD-10-CM

## 2018-03-03 ENCOUNTER — Encounter: Admit: 2018-03-03 | Discharge: 2018-03-03 | Payer: Medicaid Other

## 2018-03-03 ENCOUNTER — Ambulatory Visit: Admit: 2018-03-03 | Discharge: 2018-03-04 | Payer: Medicaid Other

## 2018-03-03 DIAGNOSIS — G935 Compression of brain: ICD-10-CM

## 2018-03-03 DIAGNOSIS — R52 Pain, unspecified: Principal | ICD-10-CM

## 2018-03-03 DIAGNOSIS — S0990XA Unspecified injury of head, initial encounter: ICD-10-CM

## 2018-03-03 DIAGNOSIS — E079 Disorder of thyroid, unspecified: ICD-10-CM

## 2018-03-03 DIAGNOSIS — R51 Headache: ICD-10-CM

## 2018-03-03 DIAGNOSIS — I82409 Acute embolism and thrombosis of unspecified deep veins of unspecified lower extremity: ICD-10-CM

## 2018-03-03 DIAGNOSIS — J45909 Unspecified asthma, uncomplicated: ICD-10-CM

## 2018-03-03 DIAGNOSIS — R079 Chest pain, unspecified: ICD-10-CM

## 2018-03-03 DIAGNOSIS — N920 Excessive and frequent menstruation with regular cycle: ICD-10-CM

## 2018-03-03 DIAGNOSIS — R011 Cardiac murmur, unspecified: ICD-10-CM

## 2018-03-03 DIAGNOSIS — F329 Major depressive disorder, single episode, unspecified: ICD-10-CM

## 2018-03-03 DIAGNOSIS — E663 Overweight: ICD-10-CM

## 2018-03-03 DIAGNOSIS — G43909 Migraine, unspecified, not intractable, without status migrainosus: ICD-10-CM

## 2018-03-03 DIAGNOSIS — I2699 Other pulmonary embolism without acute cor pulmonale: ICD-10-CM

## 2018-03-03 DIAGNOSIS — R110 Nausea: ICD-10-CM

## 2018-03-03 DIAGNOSIS — M479 Spondylosis, unspecified: ICD-10-CM

## 2018-03-03 DIAGNOSIS — M1990 Unspecified osteoarthritis, unspecified site: ICD-10-CM

## 2018-03-03 DIAGNOSIS — M797 Fibromyalgia: ICD-10-CM

## 2018-03-03 DIAGNOSIS — M2550 Pain in unspecified joint: ICD-10-CM

## 2018-03-03 DIAGNOSIS — Q0700 Arnold-Chiari syndrome without spina bifida or hydrocephalus: ICD-10-CM

## 2018-03-03 DIAGNOSIS — R002 Palpitations: ICD-10-CM

## 2018-03-03 DIAGNOSIS — M549 Dorsalgia, unspecified: ICD-10-CM

## 2018-03-03 DIAGNOSIS — G971 Other reaction to spinal and lumbar puncture: ICD-10-CM

## 2018-03-03 DIAGNOSIS — J189 Pneumonia, unspecified organism: ICD-10-CM

## 2018-03-03 DIAGNOSIS — C801 Malignant (primary) neoplasm, unspecified: ICD-10-CM

## 2018-03-03 DIAGNOSIS — F909 Attention-deficit hyperactivity disorder, unspecified type: ICD-10-CM

## 2018-03-03 DIAGNOSIS — F419 Anxiety disorder, unspecified: ICD-10-CM

## 2018-03-03 DIAGNOSIS — IMO0002 Chiari malformation: ICD-10-CM

## 2018-03-03 DIAGNOSIS — K219 Gastro-esophageal reflux disease without esophagitis: ICD-10-CM

## 2018-03-03 MED ORDER — ELETRIPTAN 20 MG PO TAB
ORAL_TABLET | Freq: Once | ORAL | 3 refills | 23.00000 days | Status: AC
Start: 2018-03-03 — End: 2018-07-03

## 2018-03-03 MED ORDER — MAGNESIUM OXIDE 400 MG (241.3 MG MAGNESIUM) PO TAB
400 mg | ORAL_TABLET | Freq: Every day | ORAL | 6 refills | Status: AC
Start: 2018-03-03 — End: 2018-07-03

## 2018-03-03 MED ORDER — PROPRANOLOL 20 MG PO TAB
20 mg | ORAL_TABLET | Freq: Two times a day (BID) | ORAL | 5 refills | Status: AC
Start: 2018-03-03 — End: 2018-07-03

## 2018-03-04 DIAGNOSIS — G43009 Migraine without aura, not intractable, without status migrainosus: ICD-10-CM

## 2018-03-04 DIAGNOSIS — M542 Cervicalgia: ICD-10-CM

## 2018-03-04 DIAGNOSIS — G43709 Chronic migraine without aura, not intractable, without status migrainosus: Principal | ICD-10-CM

## 2018-03-13 ENCOUNTER — Encounter: Admit: 2018-03-13 | Discharge: 2018-03-13 | Payer: Medicaid Other

## 2018-03-13 ENCOUNTER — Ambulatory Visit: Admit: 2018-03-13 | Discharge: 2018-03-13 | Payer: Medicaid Other

## 2018-03-13 DIAGNOSIS — I2699 Other pulmonary embolism without acute cor pulmonale: Secondary | ICD-10-CM

## 2018-03-13 DIAGNOSIS — M797 Fibromyalgia: Secondary | ICD-10-CM

## 2018-03-13 DIAGNOSIS — E663 Overweight: Secondary | ICD-10-CM

## 2018-03-13 DIAGNOSIS — J45909 Unspecified asthma, uncomplicated: Secondary | ICD-10-CM

## 2018-03-13 DIAGNOSIS — M479 Spondylosis, unspecified: Secondary | ICD-10-CM

## 2018-03-13 DIAGNOSIS — F909 Attention-deficit hyperactivity disorder, unspecified type: Secondary | ICD-10-CM

## 2018-03-13 DIAGNOSIS — G43909 Migraine, unspecified, not intractable, without status migrainosus: Secondary | ICD-10-CM

## 2018-03-13 DIAGNOSIS — I82409 Acute embolism and thrombosis of unspecified deep veins of unspecified lower extremity: Secondary | ICD-10-CM

## 2018-03-13 DIAGNOSIS — C801 Malignant (primary) neoplasm, unspecified: Secondary | ICD-10-CM

## 2018-03-13 DIAGNOSIS — G43709 Chronic migraine without aura, not intractable, without status migrainosus: Secondary | ICD-10-CM

## 2018-03-13 DIAGNOSIS — G935 Compression of brain: Secondary | ICD-10-CM

## 2018-03-13 DIAGNOSIS — R079 Chest pain, unspecified: Secondary | ICD-10-CM

## 2018-03-13 DIAGNOSIS — IMO0002 Chiari malformation: Secondary | ICD-10-CM

## 2018-03-13 DIAGNOSIS — K219 Gastro-esophageal reflux disease without esophagitis: Secondary | ICD-10-CM

## 2018-03-13 DIAGNOSIS — F329 Major depressive disorder, single episode, unspecified: Secondary | ICD-10-CM

## 2018-03-13 DIAGNOSIS — F419 Anxiety disorder, unspecified: Secondary | ICD-10-CM

## 2018-03-13 DIAGNOSIS — M7918 Myalgia, other site: Secondary | ICD-10-CM

## 2018-03-13 DIAGNOSIS — S0990XA Unspecified injury of head, initial encounter: Secondary | ICD-10-CM

## 2018-03-13 DIAGNOSIS — R011 Cardiac murmur, unspecified: Secondary | ICD-10-CM

## 2018-03-13 DIAGNOSIS — G971 Other reaction to spinal and lumbar puncture: Secondary | ICD-10-CM

## 2018-03-13 DIAGNOSIS — M549 Dorsalgia, unspecified: Secondary | ICD-10-CM

## 2018-03-13 DIAGNOSIS — E079 Disorder of thyroid, unspecified: Secondary | ICD-10-CM

## 2018-03-13 DIAGNOSIS — R110 Nausea: Secondary | ICD-10-CM

## 2018-03-13 DIAGNOSIS — R002 Palpitations: Secondary | ICD-10-CM

## 2018-03-13 DIAGNOSIS — M1990 Unspecified osteoarthritis, unspecified site: Secondary | ICD-10-CM

## 2018-03-13 DIAGNOSIS — R52 Pain, unspecified: Secondary | ICD-10-CM

## 2018-03-13 DIAGNOSIS — N920 Excessive and frequent menstruation with regular cycle: Secondary | ICD-10-CM

## 2018-03-13 DIAGNOSIS — R51 Headache: Secondary | ICD-10-CM

## 2018-03-13 DIAGNOSIS — Q0700 Arnold-Chiari syndrome without spina bifida or hydrocephalus: Secondary | ICD-10-CM

## 2018-03-13 DIAGNOSIS — M2550 Pain in unspecified joint: Secondary | ICD-10-CM

## 2018-03-13 DIAGNOSIS — J189 Pneumonia, unspecified organism: Secondary | ICD-10-CM

## 2018-03-14 MED ORDER — TRIAMCINOLONE ACETONIDE 40 MG/ML IJ SUSP
40 mg | Freq: Once | INTRAMUSCULAR | 0 refills | Status: CP | PRN
Start: 2018-03-14 — End: ?
  Administered 2018-03-13: 20:00:00 40 mg via INTRAMUSCULAR

## 2018-03-14 MED ORDER — BUPIVACAINE (PF) 0.25 % (2.5 MG/ML) IJ SOLN
2 mL | Freq: Once | INTRAMUSCULAR | 0 refills | Status: CP | PRN
Start: 2018-03-14 — End: ?
  Administered 2018-03-13: 20:00:00 2 mL via INTRAMUSCULAR

## 2018-03-21 ENCOUNTER — Encounter: Admit: 2018-03-21 | Discharge: 2018-03-21 | Payer: Medicaid Other

## 2018-04-01 ENCOUNTER — Encounter: Admit: 2018-04-01 | Discharge: 2018-04-01 | Payer: Medicaid Other

## 2018-04-01 DIAGNOSIS — H903 Sensorineural hearing loss, bilateral: Secondary | ICD-10-CM

## 2018-04-07 ENCOUNTER — Encounter: Admit: 2018-04-07 | Discharge: 2018-04-07 | Payer: Medicaid Other

## 2018-04-07 ENCOUNTER — Ambulatory Visit: Admit: 2018-04-07 | Discharge: 2018-04-07 | Payer: Medicaid Other

## 2018-04-07 DIAGNOSIS — R079 Chest pain, unspecified: ICD-10-CM

## 2018-04-07 DIAGNOSIS — I2699 Other pulmonary embolism without acute cor pulmonale: ICD-10-CM

## 2018-04-07 DIAGNOSIS — R011 Cardiac murmur, unspecified: ICD-10-CM

## 2018-04-07 DIAGNOSIS — G935 Compression of brain: ICD-10-CM

## 2018-04-07 DIAGNOSIS — M479 Spondylosis, unspecified: ICD-10-CM

## 2018-04-07 DIAGNOSIS — R490 Dysphonia: Secondary | ICD-10-CM

## 2018-04-07 DIAGNOSIS — K219 Gastro-esophageal reflux disease without esophagitis: ICD-10-CM

## 2018-04-07 DIAGNOSIS — M1990 Unspecified osteoarthritis, unspecified site: ICD-10-CM

## 2018-04-07 DIAGNOSIS — G971 Other reaction to spinal and lumbar puncture: ICD-10-CM

## 2018-04-07 DIAGNOSIS — M797 Fibromyalgia: ICD-10-CM

## 2018-04-07 DIAGNOSIS — N920 Excessive and frequent menstruation with regular cycle: ICD-10-CM

## 2018-04-07 DIAGNOSIS — I82409 Acute embolism and thrombosis of unspecified deep veins of unspecified lower extremity: ICD-10-CM

## 2018-04-07 DIAGNOSIS — M2550 Pain in unspecified joint: ICD-10-CM

## 2018-04-07 DIAGNOSIS — G43909 Migraine, unspecified, not intractable, without status migrainosus: ICD-10-CM

## 2018-04-07 DIAGNOSIS — M549 Dorsalgia, unspecified: ICD-10-CM

## 2018-04-07 DIAGNOSIS — F329 Major depressive disorder, single episode, unspecified: ICD-10-CM

## 2018-04-07 DIAGNOSIS — S0990XA Unspecified injury of head, initial encounter: ICD-10-CM

## 2018-04-07 DIAGNOSIS — R52 Pain, unspecified: Principal | ICD-10-CM

## 2018-04-07 DIAGNOSIS — R1314 Dysphagia, pharyngoesophageal phase: ICD-10-CM

## 2018-04-07 DIAGNOSIS — J387 Other diseases of larynx: ICD-10-CM

## 2018-04-07 DIAGNOSIS — Q0700 Arnold-Chiari syndrome without spina bifida or hydrocephalus: ICD-10-CM

## 2018-04-07 DIAGNOSIS — E079 Disorder of thyroid, unspecified: ICD-10-CM

## 2018-04-07 DIAGNOSIS — C801 Malignant (primary) neoplasm, unspecified: ICD-10-CM

## 2018-04-07 DIAGNOSIS — J45909 Unspecified asthma, uncomplicated: ICD-10-CM

## 2018-04-07 DIAGNOSIS — IMO0002 Chiari malformation: ICD-10-CM

## 2018-04-07 DIAGNOSIS — F419 Anxiety disorder, unspecified: ICD-10-CM

## 2018-04-07 DIAGNOSIS — H903 Sensorineural hearing loss, bilateral: Principal | ICD-10-CM

## 2018-04-07 DIAGNOSIS — R51 Headache: ICD-10-CM

## 2018-04-07 DIAGNOSIS — E663 Overweight: ICD-10-CM

## 2018-04-07 DIAGNOSIS — J189 Pneumonia, unspecified organism: ICD-10-CM

## 2018-04-07 DIAGNOSIS — F909 Attention-deficit hyperactivity disorder, unspecified type: ICD-10-CM

## 2018-04-07 DIAGNOSIS — R002 Palpitations: ICD-10-CM

## 2018-04-07 DIAGNOSIS — R110 Nausea: ICD-10-CM

## 2018-04-07 NOTE — Progress Notes
Date of Service: 04/07/2018    Subjective:             Tina [name] is a 48 y.o. female.    History of Present Illness  She is here for follow-up with problems with the left ear.  Over the last 6+ months she has noted increased noise in her left ear.  There is a high-pitched ringing sound.  She feels she cannot hear as well out of that left ear as the right.  She feels that for many many years she has to cover up that ear so she can hear out of the right.  She also has a feeling of fullness and pressure in both ears.  Valsalva does not seem to improve that.  She has been treated extensively for chronic migraine.  Nothing that she can tell actually makes the ringing worse or better.  She is also had worsening difficulty with her voice and swallowing.  She did have MRI recently which was unremarkable as far as the IACs.     Review of Systems   Constitutional: Negative.    HENT: Positive for tinnitus.    Eyes: Negative.    Respiratory: Negative.    Cardiovascular: Negative.    Gastrointestinal: Negative.    Endocrine: Negative.    Genitourinary: Negative.    Musculoskeletal: Negative.    Skin: Negative.    Allergic/Immunologic: Negative.    Neurological: Negative.    Hematological: Negative.    Psychiatric/Behavioral: Negative.          Objective:         ??? eletriptan (RELPAX) 20 mg tablet Take one tab at onset of headache.  Repeat once after 2 hours as needed.  Take no more than 2 tabs in 24 hour period.   ??? EPINEPHrine(+) (EPIPEN) 1 mg/mL injection pen Inject 0.3 mg to area(s) as directed once as needed.   ??? furosemide (LASIX) 40 mg tablet Take 20 mg by mouth twice daily.   ??? levothyroxine (SYNTHROID) 150 mcg tablet TAKE 1 TABLET BY MOUTH 6 TIMES PER WEEK TAKE ON EMPTY STOMACH NOT WITH MEDS/FOOD/DRINK   ??? magnesium oxide (MAG-OX) 400 mg (241.3 mg magnesium) tablet Take one tablet by mouth daily.   ??? meloxicam (MOBIC) 15 mg tablet Take 15 mg by mouth daily.

## 2018-04-15 ENCOUNTER — Ambulatory Visit: Admit: 2018-04-14 | Discharge: 2018-04-15 | Payer: Medicaid Other

## 2018-04-15 DIAGNOSIS — J387 Other diseases of larynx: ICD-10-CM

## 2018-04-15 DIAGNOSIS — H903 Sensorineural hearing loss, bilateral: ICD-10-CM

## 2018-04-15 DIAGNOSIS — Z9889 Other specified postprocedural states: ICD-10-CM

## 2018-04-15 DIAGNOSIS — R490 Dysphonia: Secondary | ICD-10-CM

## 2018-04-28 ENCOUNTER — Ambulatory Visit: Admit: 2018-04-28 | Discharge: 2018-04-29 | Payer: Medicaid Other

## 2018-04-28 DIAGNOSIS — Z9889 Other specified postprocedural states: ICD-10-CM

## 2018-04-28 DIAGNOSIS — F444 Conversion disorder with motor symptom or deficit: ICD-10-CM

## 2018-04-29 DIAGNOSIS — R490 Dysphonia: Principal | ICD-10-CM

## 2018-05-12 ENCOUNTER — Ambulatory Visit: Admit: 2018-05-12 | Discharge: 2018-05-13 | Payer: Medicaid Other

## 2018-05-13 DIAGNOSIS — F444 Conversion disorder with motor symptom or deficit: Principal | ICD-10-CM

## 2018-05-27 ENCOUNTER — Encounter: Admit: 2018-05-27 | Discharge: 2018-05-27 | Payer: Medicaid Other

## 2018-05-27 DIAGNOSIS — M7918 Myalgia, other site: ICD-10-CM

## 2018-05-27 DIAGNOSIS — M542 Cervicalgia: Principal | ICD-10-CM

## 2018-06-13 ENCOUNTER — Encounter: Admit: 2018-06-13 | Discharge: 2018-06-13 | Payer: Medicaid Other

## 2018-06-20 ENCOUNTER — Encounter: Admit: 2018-06-20 | Discharge: 2018-06-20 | Payer: Medicaid Other

## 2018-07-01 MED ORDER — ELETRIPTAN 20 MG PO TAB
ORAL_TABLET | Freq: Once | 3 refills | Status: CN
Start: 2018-07-01 — End: ?

## 2018-07-01 MED ORDER — MAGNESIUM OXIDE 400 MG (241.3 MG MAGNESIUM) PO TAB
400 mg | ORAL_TABLET | Freq: Every day | ORAL | 3 refills | Status: CN
Start: 2018-07-01 — End: ?

## 2018-07-03 ENCOUNTER — Ambulatory Visit: Admit: 2018-07-03 | Discharge: 2018-07-04 | Payer: Medicaid Other

## 2018-07-03 ENCOUNTER — Encounter: Admit: 2018-07-03 | Discharge: 2018-07-03 | Payer: Medicaid Other

## 2018-07-03 DIAGNOSIS — J189 Pneumonia, unspecified organism: ICD-10-CM

## 2018-07-03 DIAGNOSIS — F909 Attention-deficit hyperactivity disorder, unspecified type: ICD-10-CM

## 2018-07-03 DIAGNOSIS — G971 Other reaction to spinal and lumbar puncture: ICD-10-CM

## 2018-07-03 DIAGNOSIS — R011 Cardiac murmur, unspecified: ICD-10-CM

## 2018-07-03 DIAGNOSIS — M549 Dorsalgia, unspecified: ICD-10-CM

## 2018-07-03 DIAGNOSIS — E079 Disorder of thyroid, unspecified: ICD-10-CM

## 2018-07-03 DIAGNOSIS — E663 Overweight: ICD-10-CM

## 2018-07-03 DIAGNOSIS — G43909 Migraine, unspecified, not intractable, without status migrainosus: ICD-10-CM

## 2018-07-03 DIAGNOSIS — J45909 Unspecified asthma, uncomplicated: ICD-10-CM

## 2018-07-03 DIAGNOSIS — R079 Chest pain, unspecified: ICD-10-CM

## 2018-07-03 DIAGNOSIS — Q0700 Arnold-Chiari syndrome without spina bifida or hydrocephalus: ICD-10-CM

## 2018-07-03 DIAGNOSIS — R002 Palpitations: ICD-10-CM

## 2018-07-03 DIAGNOSIS — R110 Nausea: ICD-10-CM

## 2018-07-03 DIAGNOSIS — F419 Anxiety disorder, unspecified: ICD-10-CM

## 2018-07-03 DIAGNOSIS — K219 Gastro-esophageal reflux disease without esophagitis: ICD-10-CM

## 2018-07-03 DIAGNOSIS — I82409 Acute embolism and thrombosis of unspecified deep veins of unspecified lower extremity: ICD-10-CM

## 2018-07-03 DIAGNOSIS — R51 Headache: ICD-10-CM

## 2018-07-03 DIAGNOSIS — M2550 Pain in unspecified joint: ICD-10-CM

## 2018-07-03 DIAGNOSIS — M797 Fibromyalgia: ICD-10-CM

## 2018-07-03 DIAGNOSIS — S0990XA Unspecified injury of head, initial encounter: ICD-10-CM

## 2018-07-03 DIAGNOSIS — R52 Pain, unspecified: Principal | ICD-10-CM

## 2018-07-03 DIAGNOSIS — F329 Major depressive disorder, single episode, unspecified: ICD-10-CM

## 2018-07-03 DIAGNOSIS — C801 Malignant (primary) neoplasm, unspecified: ICD-10-CM

## 2018-07-03 DIAGNOSIS — I2699 Other pulmonary embolism without acute cor pulmonale: ICD-10-CM

## 2018-07-03 DIAGNOSIS — M479 Spondylosis, unspecified: ICD-10-CM

## 2018-07-03 DIAGNOSIS — G935 Compression of brain: ICD-10-CM

## 2018-07-03 DIAGNOSIS — IMO0002 Chiari malformation: ICD-10-CM

## 2018-07-03 DIAGNOSIS — M1990 Unspecified osteoarthritis, unspecified site: ICD-10-CM

## 2018-07-03 DIAGNOSIS — N920 Excessive and frequent menstruation with regular cycle: ICD-10-CM

## 2018-07-03 DIAGNOSIS — G43709 Chronic migraine without aura, not intractable, without status migrainosus: Principal | ICD-10-CM

## 2018-07-03 MED ORDER — PROPRANOLOL 40 MG PO TAB
40 mg | ORAL_TABLET | Freq: Two times a day (BID) | ORAL | 6 refills | Status: DC
Start: 2018-07-03 — End: 2018-08-07

## 2018-07-09 ENCOUNTER — Encounter: Admit: 2018-07-09 | Discharge: 2018-07-09 | Payer: Medicaid Other

## 2018-07-09 ENCOUNTER — Ambulatory Visit: Admit: 2018-07-09 | Discharge: 2018-07-09 | Payer: Medicaid Other

## 2018-07-09 DIAGNOSIS — E663 Overweight: ICD-10-CM

## 2018-07-09 DIAGNOSIS — I2699 Other pulmonary embolism without acute cor pulmonale: ICD-10-CM

## 2018-07-09 DIAGNOSIS — M2550 Pain in unspecified joint: ICD-10-CM

## 2018-07-09 DIAGNOSIS — G971 Other reaction to spinal and lumbar puncture: ICD-10-CM

## 2018-07-09 DIAGNOSIS — R52 Pain, unspecified: Principal | ICD-10-CM

## 2018-07-09 DIAGNOSIS — I10 Essential (primary) hypertension: Principal | ICD-10-CM

## 2018-07-09 DIAGNOSIS — Q0700 Arnold-Chiari syndrome without spina bifida or hydrocephalus: ICD-10-CM

## 2018-07-09 DIAGNOSIS — R0789 Other chest pain: ICD-10-CM

## 2018-07-09 DIAGNOSIS — F419 Anxiety disorder, unspecified: ICD-10-CM

## 2018-07-09 DIAGNOSIS — R110 Nausea: ICD-10-CM

## 2018-07-09 DIAGNOSIS — M549 Dorsalgia, unspecified: ICD-10-CM

## 2018-07-09 DIAGNOSIS — R002 Palpitations: ICD-10-CM

## 2018-07-09 DIAGNOSIS — R51 Headache: ICD-10-CM

## 2018-07-09 DIAGNOSIS — G43909 Migraine, unspecified, not intractable, without status migrainosus: ICD-10-CM

## 2018-07-09 DIAGNOSIS — J45909 Unspecified asthma, uncomplicated: ICD-10-CM

## 2018-07-09 DIAGNOSIS — R079 Chest pain, unspecified: ICD-10-CM

## 2018-07-09 DIAGNOSIS — M797 Fibromyalgia: ICD-10-CM

## 2018-07-09 DIAGNOSIS — F909 Attention-deficit hyperactivity disorder, unspecified type: ICD-10-CM

## 2018-07-09 DIAGNOSIS — M479 Spondylosis, unspecified: ICD-10-CM

## 2018-07-09 DIAGNOSIS — G935 Compression of brain: ICD-10-CM

## 2018-07-09 DIAGNOSIS — S0990XA Unspecified injury of head, initial encounter: ICD-10-CM

## 2018-07-09 DIAGNOSIS — F329 Major depressive disorder, single episode, unspecified: ICD-10-CM

## 2018-07-09 DIAGNOSIS — N920 Excessive and frequent menstruation with regular cycle: ICD-10-CM

## 2018-07-09 DIAGNOSIS — IMO0002 Chiari malformation: ICD-10-CM

## 2018-07-09 DIAGNOSIS — E079 Disorder of thyroid, unspecified: ICD-10-CM

## 2018-07-09 DIAGNOSIS — J189 Pneumonia, unspecified organism: ICD-10-CM

## 2018-07-09 DIAGNOSIS — R011 Cardiac murmur, unspecified: ICD-10-CM

## 2018-07-09 DIAGNOSIS — K219 Gastro-esophageal reflux disease without esophagitis: ICD-10-CM

## 2018-07-09 DIAGNOSIS — C801 Malignant (primary) neoplasm, unspecified: ICD-10-CM

## 2018-07-09 DIAGNOSIS — I82409 Acute embolism and thrombosis of unspecified deep veins of unspecified lower extremity: ICD-10-CM

## 2018-07-09 DIAGNOSIS — M1990 Unspecified osteoarthritis, unspecified site: ICD-10-CM

## 2018-07-09 NOTE — Assessment & Plan Note
After her most recent hypertensive event, some changes were made to her regimen.  Her blood pressure has now been running in the 1 teens over 60s to 70s and seems to be much better controlled.  I have not made other changes today.

## 2018-07-09 NOTE — Progress Notes
further evaluation.               Total time 15 minutes.

## 2018-07-22 ENCOUNTER — Encounter: Admit: 2018-07-22 | Discharge: 2018-07-22 | Payer: Medicaid Other

## 2018-07-22 ENCOUNTER — Ambulatory Visit: Admit: 2018-07-22 | Discharge: 2018-07-23 | Payer: Medicaid Other

## 2018-07-22 DIAGNOSIS — I10 Essential (primary) hypertension: Principal | ICD-10-CM

## 2018-07-23 ENCOUNTER — Encounter: Admit: 2018-07-23 | Discharge: 2018-07-23 | Payer: Medicaid Other

## 2018-07-24 ENCOUNTER — Encounter: Admit: 2018-07-24 | Discharge: 2018-07-24 | Payer: Medicaid Other

## 2018-08-07 ENCOUNTER — Ambulatory Visit: Admit: 2018-08-07 | Discharge: 2018-08-08

## 2018-08-07 ENCOUNTER — Encounter: Admit: 2018-08-07 | Discharge: 2018-08-07

## 2018-08-07 DIAGNOSIS — S0990XA Unspecified injury of head, initial encounter: Secondary | ICD-10-CM

## 2018-08-07 DIAGNOSIS — R002 Palpitations: Secondary | ICD-10-CM

## 2018-08-07 DIAGNOSIS — N920 Excessive and frequent menstruation with regular cycle: Secondary | ICD-10-CM

## 2018-08-07 DIAGNOSIS — E079 Disorder of thyroid, unspecified: Secondary | ICD-10-CM

## 2018-08-07 DIAGNOSIS — Q0700 Arnold-Chiari syndrome without spina bifida or hydrocephalus: Secondary | ICD-10-CM

## 2018-08-07 DIAGNOSIS — R079 Chest pain, unspecified: Secondary | ICD-10-CM

## 2018-08-07 DIAGNOSIS — M797 Fibromyalgia: Secondary | ICD-10-CM

## 2018-08-07 DIAGNOSIS — C801 Malignant (primary) neoplasm, unspecified: Secondary | ICD-10-CM

## 2018-08-07 DIAGNOSIS — I2699 Other pulmonary embolism without acute cor pulmonale: Secondary | ICD-10-CM

## 2018-08-07 DIAGNOSIS — K219 Gastro-esophageal reflux disease without esophagitis: Secondary | ICD-10-CM

## 2018-08-07 DIAGNOSIS — R51 Headache: Secondary | ICD-10-CM

## 2018-08-07 DIAGNOSIS — G935 Compression of brain: Secondary | ICD-10-CM

## 2018-08-07 DIAGNOSIS — M542 Cervicalgia: Secondary | ICD-10-CM

## 2018-08-07 DIAGNOSIS — M549 Dorsalgia, unspecified: Secondary | ICD-10-CM

## 2018-08-07 DIAGNOSIS — J189 Pneumonia, unspecified organism: Secondary | ICD-10-CM

## 2018-08-07 DIAGNOSIS — R110 Nausea: Secondary | ICD-10-CM

## 2018-08-07 DIAGNOSIS — M479 Spondylosis, unspecified: Secondary | ICD-10-CM

## 2018-08-07 DIAGNOSIS — E663 Overweight: Secondary | ICD-10-CM

## 2018-08-07 DIAGNOSIS — M2550 Pain in unspecified joint: Secondary | ICD-10-CM

## 2018-08-07 DIAGNOSIS — G971 Other reaction to spinal and lumbar puncture: Secondary | ICD-10-CM

## 2018-08-07 DIAGNOSIS — M1990 Unspecified osteoarthritis, unspecified site: Secondary | ICD-10-CM

## 2018-08-07 DIAGNOSIS — R011 Cardiac murmur, unspecified: Secondary | ICD-10-CM

## 2018-08-07 DIAGNOSIS — F329 Major depressive disorder, single episode, unspecified: Secondary | ICD-10-CM

## 2018-08-07 DIAGNOSIS — R0789 Other chest pain: Secondary | ICD-10-CM

## 2018-08-07 DIAGNOSIS — J45909 Unspecified asthma, uncomplicated: Secondary | ICD-10-CM

## 2018-08-07 DIAGNOSIS — F909 Attention-deficit hyperactivity disorder, unspecified type: Secondary | ICD-10-CM

## 2018-08-07 DIAGNOSIS — I82409 Acute embolism and thrombosis of unspecified deep veins of unspecified lower extremity: Secondary | ICD-10-CM

## 2018-08-07 DIAGNOSIS — G43909 Migraine, unspecified, not intractable, without status migrainosus: Secondary | ICD-10-CM

## 2018-08-07 DIAGNOSIS — R52 Pain, unspecified: Secondary | ICD-10-CM

## 2018-08-07 DIAGNOSIS — F419 Anxiety disorder, unspecified: Secondary | ICD-10-CM

## 2018-08-07 DIAGNOSIS — IMO0002 Chiari malformation: Secondary | ICD-10-CM

## 2018-08-07 NOTE — Progress Notes
[facility] CLINIC NOTE       SUBJECTIVE: Tina [name] presents in follow-up for ongoing care regarding neck and lumbar pain.  Trigger point injections have reduced pain by about 50% for about 3 months at a time.  She does not wish to have one repeated today.  She notes that her pain is currently moderate.  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  Walking and standing exacerbates her pain.  For pain relief she is taking ibuprofen as needed and does not request anything stronger at this time.         Review of Systems    Current Outpatient Medications:   ???  Diethylpropion 75 mg TbER, Take 1 tablet by mouth daily., Disp: , Rfl:   ???  EPINEPHrine(+) (EPIPEN) 1 mg/mL injection pen, Inject 0.3 mg to area(s) as directed once as needed., Disp: , Rfl:   ???  furosemide (LASIX) 40 mg tablet, Take 20 mg by mouth twice daily., Disp: , Rfl:   ???  hydroCHLOROthiazide (HYDRODIURIL) 25 mg tablet, Take 25 mg by mouth every morning. Take 1/2-1 tab daily., Disp: , Rfl:   ???  ibuprofen (MOTRIN) 800 mg tablet, Take 800 mg by mouth every 8 hours as needed for Pain. Take with food., Disp: , Rfl:   ???  levothyroxine (SYNTHROID) 150 mcg tablet, TAKE 1 TABLET BY MOUTH 6 TIMES PER WEEK TAKE ON EMPTY STOMACH NOT WITH MEDS/FOOD/DRINK, Disp: 90 tablet, Rfl: 1  ???  lisinopriL (ZESTRIL) 20 mg tablet, Take 20 mg by mouth daily., Disp: , Rfl:   ???  meloxicam (MOBIC) 15 mg tablet, Take 15 mg by mouth daily., Disp: , Rfl:   ???  metFORMIN (GLUCOPHAGE) 500 mg tablet, Take 500 mg by mouth twice daily with meals., Disp: , Rfl:   ???  mv-min/vit C/glut/lysine/hb124 ([place] (LYSINE HCL) PO), Take  by mouth., Disp: , Rfl:   ???  ondansetron (ZOFRAN ODT) 8 mg rapid dissolve tablet, Dissolve 8 mg by mouth every 8 hours as needed for Nausea or Vomiting. Place on tongue to disolve., Disp: , Rfl:   ???  other medication, 1 Dose. Medication Name & Strength: Gm Phenol ointment    Dose(how many): 1:400    Frequency(how often): Apply topically to the affected area four times daily., Disp: , Rfl:   ???  PAZEO 0.7 % drop, , Disp: , Rfl:   ???  propranoloL (INDERAL) 40 mg tablet, Take one tablet by mouth twice daily., Disp: 60 tablet, Rfl: 6  ???  SYMBICORT 160-4.5 mcg/actuation inhalation, , Disp: , Rfl:   ???  VENTOLIN HFA 90 mcg/actuation inhaler, Take 2 puffs by mouth as Needed., Disp: , Rfl: 3  Allergies   Allergen Reactions   ??? Aspirin ANAPHYLAXIS   ??? Pcn [Penicillins] ANAPHYLAXIS   ??? Reglan [Metoclopramide] SEE COMMENTS     Severe leg shakiness   ??? Adhesive Tape (Rosins) RASH   ??? Sulfa (Sulfonamide Antibiotics) NAUSEA AND VOMITING   ??? Omeprazole NAUSEA AND VOMITING     Physical Exam  Vitals:    08/07/18 1141   BP: 118/83   Pulse: 70   Resp: 18   Temp: 36.8 ???C (98.3 ???F)   TempSrc: Oral   SpO2: 99%   Weight: 102.1 kg (225 lb)   Height: 170.2 cm (67)   PainSc: Two     Oswestry Total Score:: 42  Pain Score: Two  Body mass index is 35.24 kg/m???.  General: Alert and oriented,  very pleasant female.   HEENT showed extraocular muscles were intact and no other abnormalities.  Unlabored breathing.  Regular rate and rhythm on CV exam.   5/5 strength in bilateral upper and lower extremities.    Sensation is intact to light touch and equal in the upper and lower extremities.  There is right knee anterior tenderness to palpation  Trigger points are palpable in the bilateral neck and lumbar region.       IMPRESSION:  1. Cervicalgia    2. Myalgia, other site          PLAN:  Will schedule a 77-month clinic follow-up for possible trigger point injections.

## 2018-08-08 DIAGNOSIS — M545 Low back pain: Secondary | ICD-10-CM

## 2018-08-08 DIAGNOSIS — M7918 Myalgia, other site: Secondary | ICD-10-CM

## 2018-10-05 ENCOUNTER — Encounter: Admit: 2018-10-05 | Discharge: 2018-10-06

## 2018-10-08 ENCOUNTER — Encounter: Admit: 2018-10-08 | Discharge: 2018-10-08

## 2018-10-08 ENCOUNTER — Ambulatory Visit: Admit: 2018-10-08 | Discharge: 2018-10-09

## 2018-10-08 DIAGNOSIS — F419 Anxiety disorder, unspecified: Secondary | ICD-10-CM

## 2018-10-08 DIAGNOSIS — M479 Spondylosis, unspecified: Secondary | ICD-10-CM

## 2018-10-08 DIAGNOSIS — R0602 Shortness of breath: Secondary | ICD-10-CM

## 2018-10-08 DIAGNOSIS — J4520 Mild intermittent asthma, uncomplicated: Principal | ICD-10-CM

## 2018-10-08 DIAGNOSIS — E079 Disorder of thyroid, unspecified: Secondary | ICD-10-CM

## 2018-10-08 DIAGNOSIS — R002 Palpitations: Secondary | ICD-10-CM

## 2018-10-08 DIAGNOSIS — I2699 Other pulmonary embolism without acute cor pulmonale: Secondary | ICD-10-CM

## 2018-10-08 DIAGNOSIS — E663 Overweight: Secondary | ICD-10-CM

## 2018-10-08 DIAGNOSIS — N920 Excessive and frequent menstruation with regular cycle: Secondary | ICD-10-CM

## 2018-10-08 DIAGNOSIS — G43909 Migraine, unspecified, not intractable, without status migrainosus: Secondary | ICD-10-CM

## 2018-10-08 DIAGNOSIS — R110 Nausea: Secondary | ICD-10-CM

## 2018-10-08 DIAGNOSIS — C801 Malignant (primary) neoplasm, unspecified: Secondary | ICD-10-CM

## 2018-10-08 DIAGNOSIS — I82409 Acute embolism and thrombosis of unspecified deep veins of unspecified lower extremity: Secondary | ICD-10-CM

## 2018-10-08 DIAGNOSIS — M1990 Unspecified osteoarthritis, unspecified site: Secondary | ICD-10-CM

## 2018-10-08 DIAGNOSIS — R079 Chest pain, unspecified: Secondary | ICD-10-CM

## 2018-10-08 DIAGNOSIS — F329 Major depressive disorder, single episode, unspecified: Secondary | ICD-10-CM

## 2018-10-08 DIAGNOSIS — R51 Headache: Secondary | ICD-10-CM

## 2018-10-08 DIAGNOSIS — Q0700 Arnold-Chiari syndrome without spina bifida or hydrocephalus: Secondary | ICD-10-CM

## 2018-10-08 DIAGNOSIS — G935 Compression of brain: Secondary | ICD-10-CM

## 2018-10-08 DIAGNOSIS — G971 Other reaction to spinal and lumbar puncture: Secondary | ICD-10-CM

## 2018-10-08 DIAGNOSIS — M2550 Pain in unspecified joint: Secondary | ICD-10-CM

## 2018-10-08 DIAGNOSIS — J45909 Unspecified asthma, uncomplicated: Secondary | ICD-10-CM

## 2018-10-08 DIAGNOSIS — R0789 Other chest pain: Secondary | ICD-10-CM

## 2018-10-08 DIAGNOSIS — R52 Pain, unspecified: Secondary | ICD-10-CM

## 2018-10-08 DIAGNOSIS — J189 Pneumonia, unspecified organism: Secondary | ICD-10-CM

## 2018-10-08 DIAGNOSIS — K219 Gastro-esophageal reflux disease without esophagitis: Secondary | ICD-10-CM

## 2018-10-08 DIAGNOSIS — IMO0002 Chiari malformation: Secondary | ICD-10-CM

## 2018-10-08 DIAGNOSIS — M797 Fibromyalgia: Secondary | ICD-10-CM

## 2018-10-08 DIAGNOSIS — F909 Attention-deficit hyperactivity disorder, unspecified type: Secondary | ICD-10-CM

## 2018-10-08 DIAGNOSIS — M549 Dorsalgia, unspecified: Secondary | ICD-10-CM

## 2018-10-08 DIAGNOSIS — J302 Other seasonal allergic rhinitis: Secondary | ICD-10-CM

## 2018-10-08 DIAGNOSIS — R011 Cardiac murmur, unspecified: Secondary | ICD-10-CM

## 2018-10-08 DIAGNOSIS — S0990XA Unspecified injury of head, initial encounter: Secondary | ICD-10-CM

## 2018-10-08 MED ORDER — CETIRIZINE 10 MG PO TAB
10 mg | ORAL_TABLET | Freq: Every morning | ORAL | 3 refills | Status: DC
Start: 2018-10-08 — End: 2019-10-15

## 2018-10-09 DIAGNOSIS — F4310 Post-traumatic stress disorder, unspecified: Secondary | ICD-10-CM

## 2018-10-10 ENCOUNTER — Encounter: Admit: 2018-10-10 | Discharge: 2018-10-10

## 2018-10-10 DIAGNOSIS — R079 Chest pain, unspecified: Secondary | ICD-10-CM

## 2018-10-10 DIAGNOSIS — F329 Major depressive disorder, single episode, unspecified: Secondary | ICD-10-CM

## 2018-10-10 DIAGNOSIS — R0789 Other chest pain: Secondary | ICD-10-CM

## 2018-10-10 DIAGNOSIS — G43909 Migraine, unspecified, not intractable, without status migrainosus: Secondary | ICD-10-CM

## 2018-10-10 DIAGNOSIS — E079 Disorder of thyroid, unspecified: Secondary | ICD-10-CM

## 2018-10-10 DIAGNOSIS — M479 Spondylosis, unspecified: Secondary | ICD-10-CM

## 2018-10-10 DIAGNOSIS — R002 Palpitations: Secondary | ICD-10-CM

## 2018-10-10 DIAGNOSIS — IMO0002 Chiari malformation: Secondary | ICD-10-CM

## 2018-10-10 DIAGNOSIS — G971 Other reaction to spinal and lumbar puncture: Secondary | ICD-10-CM

## 2018-10-10 DIAGNOSIS — M1990 Unspecified osteoarthritis, unspecified site: Secondary | ICD-10-CM

## 2018-10-10 DIAGNOSIS — M2550 Pain in unspecified joint: Secondary | ICD-10-CM

## 2018-10-10 DIAGNOSIS — F419 Anxiety disorder, unspecified: Secondary | ICD-10-CM

## 2018-10-10 DIAGNOSIS — R52 Pain, unspecified: Secondary | ICD-10-CM

## 2018-10-10 DIAGNOSIS — G935 Compression of brain: Secondary | ICD-10-CM

## 2018-10-10 DIAGNOSIS — M549 Dorsalgia, unspecified: Secondary | ICD-10-CM

## 2018-10-10 DIAGNOSIS — R51 Headache: Secondary | ICD-10-CM

## 2018-10-10 DIAGNOSIS — R110 Nausea: Secondary | ICD-10-CM

## 2018-10-10 DIAGNOSIS — Q0700 Arnold-Chiari syndrome without spina bifida or hydrocephalus: Secondary | ICD-10-CM

## 2018-10-10 DIAGNOSIS — I2699 Other pulmonary embolism without acute cor pulmonale: Secondary | ICD-10-CM

## 2018-10-10 DIAGNOSIS — R011 Cardiac murmur, unspecified: Secondary | ICD-10-CM

## 2018-10-10 DIAGNOSIS — K219 Gastro-esophageal reflux disease without esophagitis: Secondary | ICD-10-CM

## 2018-10-10 DIAGNOSIS — F909 Attention-deficit hyperactivity disorder, unspecified type: Secondary | ICD-10-CM

## 2018-10-10 DIAGNOSIS — M797 Fibromyalgia: Secondary | ICD-10-CM

## 2018-10-10 DIAGNOSIS — I82409 Acute embolism and thrombosis of unspecified deep veins of unspecified lower extremity: Secondary | ICD-10-CM

## 2018-10-10 DIAGNOSIS — N920 Excessive and frequent menstruation with regular cycle: Secondary | ICD-10-CM

## 2018-10-10 DIAGNOSIS — C801 Malignant (primary) neoplasm, unspecified: Secondary | ICD-10-CM

## 2018-10-10 DIAGNOSIS — E663 Overweight: Secondary | ICD-10-CM

## 2018-10-10 DIAGNOSIS — S0990XA Unspecified injury of head, initial encounter: Secondary | ICD-10-CM

## 2018-10-10 DIAGNOSIS — J189 Pneumonia, unspecified organism: Secondary | ICD-10-CM

## 2018-10-10 DIAGNOSIS — J45909 Unspecified asthma, uncomplicated: Secondary | ICD-10-CM

## 2018-10-10 NOTE — Telephone Encounter
DC summary, PFT and imaging report and imaging disk brought into clinic by patient. Documents sent to be scanned to patient's chart and imaging disk dropped off to [facility] to have images uploaded and attached to patient's chart.     Routing to [employer], RN  Princella Ion, [place]

## 2018-10-14 ENCOUNTER — Encounter: Admit: 2018-10-14 | Discharge: 2018-10-14

## 2018-11-17 ENCOUNTER — Encounter: Admit: 2018-11-17 | Discharge: 2018-11-17 | Payer: Medicaid Other

## 2018-11-17 NOTE — Telephone Encounter
Pt left voicemail requesting call back to discuss symptoms.     Returned pt's call.  Pt reports eating trail mix on Saturday, 11/15/2018, and becoming sick to her stomach and vomiting.  After vomiting episodes pt reports coughing up white, frothy sputum and having a sore throat.  Pt denies wheezing, shortness of breath, chest pain/tightness.      Pt reports possible exposure to [place].  Pt's friend was in direct contact with someone who tested positive for [place], but pt's friend has not been tested.  Pt reports that her possible exposure was 1 week ago and denies fever, chills, night sweats, body aches, current nausea/vomiting.      Discussed symptoms of COVID19 and advised pt to contact triage line.  Pt states vomiting was related to "eating something that did not agree with her" and the sore throat is related to vomiting.  Pt refused to contact [place] hotline at this time, but will monitor symptoms and will call if pt develops other symptoms.

## 2019-02-04 ENCOUNTER — Ambulatory Visit: Admit: 2019-02-04 | Discharge: 2019-02-04 | Payer: Medicaid Other

## 2019-02-04 ENCOUNTER — Encounter: Admit: 2019-02-04 | Discharge: 2019-02-04 | Payer: Medicaid Other

## 2019-02-04 DIAGNOSIS — G935 Compression of brain: Secondary | ICD-10-CM

## 2019-02-04 DIAGNOSIS — J45909 Unspecified asthma, uncomplicated: Secondary | ICD-10-CM

## 2019-02-04 DIAGNOSIS — J4520 Mild intermittent asthma, uncomplicated: Secondary | ICD-10-CM

## 2019-02-04 DIAGNOSIS — M2550 Pain in unspecified joint: Secondary | ICD-10-CM

## 2019-02-04 DIAGNOSIS — C801 Malignant (primary) neoplasm, unspecified: Secondary | ICD-10-CM

## 2019-02-04 DIAGNOSIS — G971 Other reaction to spinal and lumbar puncture: Secondary | ICD-10-CM

## 2019-02-04 DIAGNOSIS — F909 Attention-deficit hyperactivity disorder, unspecified type: Secondary | ICD-10-CM

## 2019-02-04 DIAGNOSIS — S0990XA Unspecified injury of head, initial encounter: Secondary | ICD-10-CM

## 2019-02-04 DIAGNOSIS — R002 Palpitations: Secondary | ICD-10-CM

## 2019-02-04 DIAGNOSIS — G43909 Migraine, unspecified, not intractable, without status migrainosus: Secondary | ICD-10-CM

## 2019-02-04 DIAGNOSIS — Q0700 Arnold-Chiari syndrome without spina bifida or hydrocephalus: Secondary | ICD-10-CM

## 2019-02-04 DIAGNOSIS — M1990 Unspecified osteoarthritis, unspecified site: Secondary | ICD-10-CM

## 2019-02-04 DIAGNOSIS — M479 Spondylosis, unspecified: Secondary | ICD-10-CM

## 2019-02-04 DIAGNOSIS — K219 Gastro-esophageal reflux disease without esophagitis: Secondary | ICD-10-CM

## 2019-02-04 DIAGNOSIS — E079 Disorder of thyroid, unspecified: Secondary | ICD-10-CM

## 2019-02-04 DIAGNOSIS — R110 Nausea: Secondary | ICD-10-CM

## 2019-02-04 DIAGNOSIS — N920 Excessive and frequent menstruation with regular cycle: Secondary | ICD-10-CM

## 2019-02-04 DIAGNOSIS — R079 Chest pain, unspecified: Secondary | ICD-10-CM

## 2019-02-04 DIAGNOSIS — M797 Fibromyalgia: Secondary | ICD-10-CM

## 2019-02-04 DIAGNOSIS — J189 Pneumonia, unspecified organism: Secondary | ICD-10-CM

## 2019-02-04 DIAGNOSIS — R52 Pain, unspecified: Secondary | ICD-10-CM

## 2019-02-04 DIAGNOSIS — I82409 Acute embolism and thrombosis of unspecified deep veins of unspecified lower extremity: Secondary | ICD-10-CM

## 2019-02-04 DIAGNOSIS — R0789 Other chest pain: Secondary | ICD-10-CM

## 2019-02-04 DIAGNOSIS — R011 Cardiac murmur, unspecified: Secondary | ICD-10-CM

## 2019-02-04 DIAGNOSIS — F419 Anxiety disorder, unspecified: Secondary | ICD-10-CM

## 2019-02-04 DIAGNOSIS — I2699 Other pulmonary embolism without acute cor pulmonale: Secondary | ICD-10-CM

## 2019-02-04 DIAGNOSIS — E663 Overweight: Secondary | ICD-10-CM

## 2019-02-04 DIAGNOSIS — IMO0002 Chiari malformation: Secondary | ICD-10-CM

## 2019-02-04 DIAGNOSIS — F329 Major depressive disorder, single episode, unspecified: Secondary | ICD-10-CM

## 2019-02-04 DIAGNOSIS — M549 Dorsalgia, unspecified: Secondary | ICD-10-CM

## 2019-02-04 DIAGNOSIS — R519 Generalized headaches: Secondary | ICD-10-CM

## 2019-02-04 LAB — CBC AND DIFF
Lab: 0.1 K/UL (ref <1.0)
Lab: 0.2 10*3/uL (ref 0–0.45)
Lab: 1 % (ref 0–2)
Lab: 14 % (ref 11–15)
Lab: 14 g/dL (ref 12.0–15.0)
Lab: 2 % (ref 0–5)
Lab: 2.2 10*3/uL (ref 1.0–4.8)
Lab: 26 % (ref 24–44)
Lab: 29 pg (ref 26–34)
Lab: 34 g/dL (ref 32.0–36.0)
Lab: 355 10*3/uL (ref 150–400)
Lab: 4.7 M/UL (ref 4.0–5.0)
Lab: 40 % (ref 36–45)
Lab: 6 % (ref 4–12)
Lab: 65 % (ref 41–77)
Lab: 7.6 FL (ref 7–11)
Lab: 8.6 K/UL — ABNORMAL HIGH (ref 4.5–11.0)
Lab: 85 FL (ref 80–100)

## 2019-02-04 LAB — IMMUNOGLOBULIN E (IGE): Lab: 184 [IU]/mL — ABNORMAL HIGH (ref <101)

## 2019-02-05 ENCOUNTER — Encounter: Admit: 2019-02-05 | Discharge: 2019-02-05 | Payer: Medicaid Other

## 2019-02-05 DIAGNOSIS — C801 Malignant (primary) neoplasm, unspecified: Secondary | ICD-10-CM

## 2019-02-05 DIAGNOSIS — K219 Gastro-esophageal reflux disease without esophagitis: Secondary | ICD-10-CM

## 2019-02-05 DIAGNOSIS — M2550 Pain in unspecified joint: Secondary | ICD-10-CM

## 2019-02-05 DIAGNOSIS — Q0700 Arnold-Chiari syndrome without spina bifida or hydrocephalus: Secondary | ICD-10-CM

## 2019-02-05 DIAGNOSIS — F909 Attention-deficit hyperactivity disorder, unspecified type: Secondary | ICD-10-CM

## 2019-02-05 DIAGNOSIS — E079 Disorder of thyroid, unspecified: Secondary | ICD-10-CM

## 2019-02-05 DIAGNOSIS — M479 Spondylosis, unspecified: Secondary | ICD-10-CM

## 2019-02-05 DIAGNOSIS — E663 Overweight: Secondary | ICD-10-CM

## 2019-02-05 DIAGNOSIS — IMO0002 Chiari malformation: Secondary | ICD-10-CM

## 2019-02-05 DIAGNOSIS — G43909 Migraine, unspecified, not intractable, without status migrainosus: Secondary | ICD-10-CM

## 2019-02-05 DIAGNOSIS — R011 Cardiac murmur, unspecified: Secondary | ICD-10-CM

## 2019-02-05 DIAGNOSIS — J45909 Unspecified asthma, uncomplicated: Secondary | ICD-10-CM

## 2019-02-05 DIAGNOSIS — F329 Major depressive disorder, single episode, unspecified: Secondary | ICD-10-CM

## 2019-02-05 DIAGNOSIS — R52 Pain, unspecified: Secondary | ICD-10-CM

## 2019-02-05 DIAGNOSIS — F419 Anxiety disorder, unspecified: Secondary | ICD-10-CM

## 2019-02-05 DIAGNOSIS — R079 Chest pain, unspecified: Secondary | ICD-10-CM

## 2019-02-05 DIAGNOSIS — M549 Dorsalgia, unspecified: Secondary | ICD-10-CM

## 2019-02-05 DIAGNOSIS — I82409 Acute embolism and thrombosis of unspecified deep veins of unspecified lower extremity: Secondary | ICD-10-CM

## 2019-02-05 DIAGNOSIS — R110 Nausea: Secondary | ICD-10-CM

## 2019-02-05 DIAGNOSIS — N920 Excessive and frequent menstruation with regular cycle: Secondary | ICD-10-CM

## 2019-02-05 DIAGNOSIS — G935 Compression of brain: Secondary | ICD-10-CM

## 2019-02-05 DIAGNOSIS — R519 Generalized headaches: Secondary | ICD-10-CM

## 2019-02-05 DIAGNOSIS — G971 Other reaction to spinal and lumbar puncture: Secondary | ICD-10-CM

## 2019-02-05 DIAGNOSIS — M797 Fibromyalgia: Secondary | ICD-10-CM

## 2019-02-05 DIAGNOSIS — S0990XA Unspecified injury of head, initial encounter: Secondary | ICD-10-CM

## 2019-02-05 DIAGNOSIS — I2699 Other pulmonary embolism without acute cor pulmonale: Secondary | ICD-10-CM

## 2019-02-05 DIAGNOSIS — M1990 Unspecified osteoarthritis, unspecified site: Secondary | ICD-10-CM

## 2019-02-05 DIAGNOSIS — R0789 Other chest pain: Secondary | ICD-10-CM

## 2019-02-05 DIAGNOSIS — J189 Pneumonia, unspecified organism: Secondary | ICD-10-CM

## 2019-02-05 DIAGNOSIS — R002 Palpitations: Secondary | ICD-10-CM

## 2019-04-09 ENCOUNTER — Encounter: Admit: 2019-04-09 | Discharge: 2019-04-09 | Payer: Medicaid Other

## 2019-04-09 NOTE — Telephone Encounter
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     Attempted to reach pt, but pt did not answer, left voicemail for pt to return call back.

## 2019-04-28 ENCOUNTER — Encounter: Admit: 2019-04-28 | Discharge: 2019-04-28 | Payer: Medicaid Other

## 2019-05-05 ENCOUNTER — Encounter: Admit: 2019-05-05 | Discharge: 2019-05-05 | Payer: Medicaid Other

## 2019-05-06 ENCOUNTER — Encounter: Admit: 2019-05-06 | Discharge: 2019-05-06 | Payer: Medicaid Other

## 2019-05-06 ENCOUNTER — Ambulatory Visit: Admit: 2019-05-06 | Discharge: 2019-05-06 | Payer: Medicaid Other

## 2019-05-06 DIAGNOSIS — F329 Major depressive disorder, single episode, unspecified: Secondary | ICD-10-CM

## 2019-05-06 DIAGNOSIS — E079 Disorder of thyroid, unspecified: Secondary | ICD-10-CM

## 2019-05-06 DIAGNOSIS — C801 Malignant (primary) neoplasm, unspecified: Secondary | ICD-10-CM

## 2019-05-06 DIAGNOSIS — M549 Dorsalgia, unspecified: Secondary | ICD-10-CM

## 2019-05-06 DIAGNOSIS — R110 Nausea: Secondary | ICD-10-CM

## 2019-05-06 DIAGNOSIS — N920 Excessive and frequent menstruation with regular cycle: Secondary | ICD-10-CM

## 2019-05-06 DIAGNOSIS — H903 Sensorineural hearing loss, bilateral: Secondary | ICD-10-CM

## 2019-05-06 DIAGNOSIS — R55 Syncope and collapse: Secondary | ICD-10-CM

## 2019-05-06 DIAGNOSIS — R079 Chest pain, unspecified: Secondary | ICD-10-CM

## 2019-05-06 DIAGNOSIS — R52 Pain, unspecified: Secondary | ICD-10-CM

## 2019-05-06 DIAGNOSIS — G43909 Migraine, unspecified, not intractable, without status migrainosus: Secondary | ICD-10-CM

## 2019-05-06 DIAGNOSIS — K219 Gastro-esophageal reflux disease without esophagitis: Secondary | ICD-10-CM

## 2019-05-06 DIAGNOSIS — I82409 Acute embolism and thrombosis of unspecified deep veins of unspecified lower extremity: Secondary | ICD-10-CM

## 2019-05-06 DIAGNOSIS — Q0700 Arnold-Chiari syndrome without spina bifida or hydrocephalus: Secondary | ICD-10-CM

## 2019-05-06 DIAGNOSIS — J189 Pneumonia, unspecified organism: Secondary | ICD-10-CM

## 2019-05-06 DIAGNOSIS — G971 Other reaction to spinal and lumbar puncture: Secondary | ICD-10-CM

## 2019-05-06 DIAGNOSIS — M797 Fibromyalgia: Secondary | ICD-10-CM

## 2019-05-06 DIAGNOSIS — J45909 Unspecified asthma, uncomplicated: Secondary | ICD-10-CM

## 2019-05-06 DIAGNOSIS — R519 Generalized headaches: Secondary | ICD-10-CM

## 2019-05-06 DIAGNOSIS — H9201 Otalgia, right ear: Secondary | ICD-10-CM

## 2019-05-06 DIAGNOSIS — F909 Attention-deficit hyperactivity disorder, unspecified type: Secondary | ICD-10-CM

## 2019-05-06 DIAGNOSIS — G935 Compression of brain: Secondary | ICD-10-CM

## 2019-05-06 DIAGNOSIS — R011 Cardiac murmur, unspecified: Secondary | ICD-10-CM

## 2019-05-06 DIAGNOSIS — E663 Overweight: Secondary | ICD-10-CM

## 2019-05-06 DIAGNOSIS — F419 Anxiety disorder, unspecified: Secondary | ICD-10-CM

## 2019-05-06 DIAGNOSIS — M479 Spondylosis, unspecified: Secondary | ICD-10-CM

## 2019-05-06 DIAGNOSIS — I2699 Other pulmonary embolism without acute cor pulmonale: Secondary | ICD-10-CM

## 2019-05-06 DIAGNOSIS — S0990XA Unspecified injury of head, initial encounter: Secondary | ICD-10-CM

## 2019-05-06 DIAGNOSIS — R0789 Other chest pain: Secondary | ICD-10-CM

## 2019-05-06 DIAGNOSIS — M1990 Unspecified osteoarthritis, unspecified site: Secondary | ICD-10-CM

## 2019-05-06 DIAGNOSIS — R269 Unspecified abnormalities of gait and mobility: Secondary | ICD-10-CM

## 2019-05-06 DIAGNOSIS — M2550 Pain in unspecified joint: Secondary | ICD-10-CM

## 2019-05-06 DIAGNOSIS — IMO0002 Chiari malformation: Secondary | ICD-10-CM

## 2019-05-06 DIAGNOSIS — R002 Palpitations: Secondary | ICD-10-CM

## 2019-05-06 DIAGNOSIS — R209 Unspecified disturbances of skin sensation: Secondary | ICD-10-CM

## 2019-05-06 NOTE — Progress Notes
Date of Service: 05/06/2019    Subjective:             Tina [name] is a 49 y.o. female.    History of Present Illness  She has had 2 episodes in the last month where she has had a lot of pain and being bleeding in the left ear.  She was started on antibiotic drops which stopped the bleeding.  Continues to have fullness in the left ear and is noted some hearing loss as well.  She describes pain in and around the left ear.  Sometimes behind the left ear.  She has had difficulty with spells of disequilibrium and true syncope.  She saw her neurologist, Dr. Sunday Corn, who ordered MRI because of increasing headaches.  She is also scheduled to see cardiology because of possible hypotension.     Review of Systems   Constitutional: Negative.    HENT: Positive for ear discharge and ear pain.    Eyes: Negative.    Respiratory: Negative.    Cardiovascular: Negative.    Gastrointestinal: Negative.    Endocrine: Negative.    Genitourinary: Negative.    Musculoskeletal: Negative.    Skin: Negative.    Allergic/Immunologic: Negative.    Neurological: Negative.    Hematological: Negative.    Psychiatric/Behavioral: Negative.          Objective:         ? acetaminophen (TYLENOL) 500 mg tablet Take 500 mg by mouth every 6 hours as needed for Pain. Max of 4,000 mg of acetaminophen in 24 hours.   ? ascorbic acid (VITAMIN C PO) Take  by mouth.   ? baclofen (LIORESAL) 20 mg tablet Take 20 mg by mouth twice daily.   ? cetirizine (ZYRTEC) 10 mg tablet Take one tablet by mouth every morning.   ? ciprofloxacin (CILOXAN) 0.3 % ophthalmic ointment Apply 0.5 inches topically to affected area twice daily.   ? Diethylpropion 75 mg TbER Take 1 tablet by mouth daily.   ? EPINEPHrine(+) (EPIPEN) 1 mg/mL injection pen Inject 0.3 mg to area(s) as directed once as needed.   ? ergocalciferol (vitamin D2) (VITAMIN D PO) Take  by mouth.   ? eszopiclone (LUNESTA) 3 mg tablet Take 3 mg by mouth at bedtime daily.   ? furosemide (LASIX) 40 mg tablet Take 40 mg by mouth every morning.   ? hydroCHLOROthiazide (HYDRODIURIL) 25 mg tablet Take 25 mg by mouth every morning. Take 1/2-1 tab daily.   ? ibuprofen (MOTRIN) 800 mg tablet Take 800 mg by mouth every 8 hours as needed for Pain. Take with food.   ? levothyroxine (SYNTHROID) 150 mcg tablet TAKE 1 TABLET BY MOUTH 6 TIMES PER WEEK TAKE ON EMPTY STOMACH NOT WITH MEDS/FOOD/DRINK   ? ondansetron (ZOFRAN ODT) 8 mg rapid dissolve tablet Dissolve 8 mg by mouth every 8 hours as needed for Nausea or Vomiting. Place on tongue to disolve.   ? other medication 1 Dose. Medication Name & Strength: Gm Phenol ointment    Dose(how many): 1:400    Frequency(how often): Apply topically to the affected area four times daily.   ? OXcarbazepine (TRILEPTAL) 600 mg tablet Take 600 mg by mouth twice daily.   ? potassium chloride SR (K-DUR) 10 mEq tablet Take 10 mEq by mouth twice daily. Take with a meal and a full glass of water.   ? prazosin (MINIPRESS) 1 mg capsule Take 1 mg by mouth at bedtime daily.   ? spironolactone (ALDACTONE) 50 mg  tablet Take 50 mg by mouth daily. Take with food.   ? sumatriptan succinate (IMITREX) 100 mg tablet Take 100 mg by mouth as Needed for Migraine symptoms. Take one tablet by mouth at onset of headache. May repeat after 2 hours if needed. Max of 200 mg in 24 hours.   ? SYMBICORT 160-4.5 mcg/actuation inhalation    ? VENTOLIN HFA 90 mcg/actuation inhaler Take 2 puffs by mouth as Needed.   ? zaleplon (SONATA) 5 mg capsule Take 5 mg by mouth at bedtime daily.     There were no vitals filed for this visit.  There is no height or weight on file to calculate BMI.     Physical Exam     Constitutional: She appears well-developed and well-nourished.   HENT:   Normal voice  Head: Normocephalic and atraumatic.   Right Ear: Tympanic membrane, external ear and ear canal normal. No drainage, swelling or tenderness. No middle ear effusion.   Left Ear: Well visualized under the microscope the tympanic membrane was normal. There were 2 small areas of abrasion in the lateral canal.  The external ear was normal no drainage, swelling or tenderness.  No middle ear effusion.   Eyes: EOM and lids are normal.   Neck: Trachea normal, normal range of motion and phonation normal. No thyromegaly present.   No adenopathy.  Salivary Glands: normal  Cranial nerves: 2-12 normal  TMJ: Tender on the left.  Increases with distraction of the mandible.    Testing:    Audio date 05/06/2019  Right 22/18 dB 56%  Left 27/25 dB 96%  Normal tympanometry.  There is a mild sensory hearing loss.  More marked on the left side from previous exam.  Poor reliability of word recognition       Assessment and Plan:  This is a 49 year old complaining of repeated episodes of ear pain.  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  She also has dry canals.  Presently on antibiotic eardrops which she should continue for the next for 5 days.  Talked by using baby oil etc. to keep ears soft.  TMJ sheet given.  She is scheduled for an MRI for headaches will add to look at IACs.  Certainly Chiari malformation might be causing some of this.  Repeat hearing test in 3 months.  May need amplification

## 2019-05-06 NOTE — Progress Notes
Date of Service: 05/06/2019     Subjective:               Tina [name] is a 49 y.o. female.        History of Present Illness    I last saw her in April 2020 via telehealth.  She had improved chronic headaches on propranolol but had uncontrolled hypertension.  I stopped Relpax due to hypertension and increased propranolol to 40 mg twice daily.    She was supposed to follow-up in 6 months but did not do so.    MRI of the head was done March 13, 2018 and was read as normal.  No evidence of acute or recent infarction.  Parenchyma was benign.    However, she has a new complaint of several falls in the last few months and wanted to be evaluated.    Headaches have been fairly well controlled.  She stopped taking Propranolol because the date had run out on it.      Headaches now happen infrequently.  One happened two weeks ago and lasted 8 hours.  Another happened 2 days ago and lasted 12-13 hours.  Her PCP prescribed Sumatriptan and it did not help now but it did a few years ago.    She has had imbalance.  This started about 1 month ago.  She has had a couple of ear infections.  She walks and suddenly falls and right leg gives out.  She gets dizzy with the spell.  She feels the room spinning and she feels tired and weak after event.  She has lost consciousness with a few spells.  She has checked BP and sometimes it is high and sometimes it is way low.  She had an event when she was syncopal and had spell of 91/42.      She is scheduled to see ENT today.   She has an upcoming appointment with her Cardiologist also.      She denies positional lightheadedness but has positional dizziness that she describes as a feeling she will pass out somewhat.  Sitting down helps.  She has not stayed standing so doesn't know if she will lose consciousness.  It feels like the heart beats quickly.      She has had numbness and tingling in the feet.  Legs swell.  There is burning in the feet.  She does not know if balance is worse on dark or uneven surfaces.      She denies convulsions.  There is no aura to her dizziness and falls.  She has had no urinary incontinence with spells.         Review of Systems   Constitutional: Positive for diaphoresis.   HENT: Positive for drooling, ear pain, nosebleeds and tinnitus.    Eyes: Positive for pain.   Respiratory: Positive for chest tightness, shortness of breath and wheezing.    Cardiovascular: Positive for chest pain and leg swelling.   Gastrointestinal: Negative.    Endocrine: Negative.    Genitourinary: Negative.    Musculoskeletal: Positive for back pain, joint swelling, neck pain and neck stiffness.   Skin: Negative.    Allergic/Immunologic: Positive for food allergies.   Neurological: Positive for dizziness, weakness and headaches.   Hematological: Negative.    Psychiatric/Behavioral: Positive for sleep disturbance. The patient is nervous/anxious.        Chief Complaint:  Chief Complaint   Patient presents with   ? Falls  Past Medical History:  Medical History:   Diagnosis Date   ? Acid reflux    ? ADHD (attention deficit hyperactivity disorder)     Have had since I was a child   ? Anxiety     panic attacks   ? Arnold-Chiari malformation (HCC)    ? Arthritis    ? Asthma    ? Cancer [facility])     cervical   ? Chest pain     associated with asthma?   ? 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 Macario Carls in early 2000s   ? Chiari malformation     Dr. Danielle Dess did my surgery at [facility]   ? Chronic back pain 02/11/2009   ? Deep vein thrombosis (DVT) (HCC) 1996    right leg   ? Degenerative arthritis of spine    ? Depression     In the past   ? Fibromyalgia    ? Fibromyalgia    ? Generalized headaches     migraines   ? Head injury    ? Heart murmur     Was noticed during a test   ? HTN    ? Joint pain     Arthritis   ? Menorrhagia    ? Migraines    ? Nausea    ? Other chest pain 07/09/2018   ? Overweight(278.02)    ? Pain     back, neck, legs   ? Pneumonia     In the past   ? Pulmonary embolism [facility])     My lags   ? Rapid palpitations 02/11/2009   ? Spinal headache     Chiar headache they start my neck and going to my head   ? Thyroid disorder        Surgical History:  Surgical History:   Procedure Laterality Date   ? HYSTERECTOMY  2005    uterus + 1 ovary removed   ? HX KNEE ARTHROSCOPY Left Jan 2016    and Dec 2015   ? TOTAL THYROIDECTOMY, POSSIBLE CENTRAL NECK LYMPH NODE DISSECTION, INTRAOPERATIVE RECURRENT LARYNGEAL NERVE MONITORING, INTRAOPERATIVE PARATHYROID HORMONE MONITORING, PARATHYROIDECTOMY N/A 08/06/2014    Performed by Marcelle Overlie, MD at [facility] OR   ? UPPER GASTROINTESTINAL ENDOSCOPY  11/03/2015   ? HX CHOLECYSTECTOMY  04/29/2017   ? HX CESAREAN SECTION     ? HX HYSTERECTOMY      [facility] did it   ? HX KNEE ARTHROSCOPY Right 1990's   ? HX SURGERY      neck area, chiari malformation   ? HX SURGERY      abdominal sx   ? HX TUBAL LIGATION     ? KNEE SURGERY      Both knee have been scoped   ? OOPHORECTOMY      remaining ovary removed       Social History:   Social History     Socioeconomic History   ? Marital status: Divorced     Spouse name: Not on file   ? Number of children: Not on file   ? Years of education: Not on file   ? Highest education level: Not on file   Occupational History   ? Not on file   Tobacco Use   ? Smoking status: Former Smoker     Packs/day: 1.50     Years: 15.00     Pack years: 22.50     Types: Cigarettes  Quit date: 07/04/1994     Years since quitting: 24.8   ? Smokeless tobacco: Former Neurosurgeon     Types: Chew   Substance and Sexual Activity   ? Alcohol use: Yes     Alcohol/week: 1.0 standard drinks     Types: 1 Glasses of wine per week     Comment: a drink every year/ special occasion   ? 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 Sexual activity: Not Currently     Partners: Male     Birth control/protection: None   Other Topics Concern   ? Not on file   Social History Narrative   ? Not on file Family History:  Family History   Problem Relation Age of Onset   ? Cancer Father         My dad passed away of leukemia and lung cancer and another form of cancer   ? Heart Failure Father    ? Migraines Father    ? Alcohol liver disease Father         Was a heavy drinker throughout life. 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 Arthritis Father         Hands lagsand back   ? Back pain Father         Had arthritis in his back he had been paralyzed at one point also was in a motorcycle accident and semi accident   ? Hypertension Father    ? Joint Pain Father    ? Arthritis-rheumatoid Other    ? Seizures Other    ? Mental Retardation Sister    ? Migraines Sister    ? Tremor Sister    ? Diabetes Sister    ? Stroke Maternal Grandfather    ? Stroke Paternal Grandmother    ? Cancer Paternal Grandmother         My grandmother had bone cancer cervical cancer   ? Heart Attack Paternal Grandmother    ? Heart problem Paternal Grandmother    ? Hypertension Paternal Grandmother    ? Joint Pain Paternal Grandmother    ? Osteoporosis Paternal Grandmother    ? Other Other         hyerectomies in age 23s or 60s in her sisters.    ? Heart problem Brother    ? Hypertension Brother    ? Joint Pain Brother    ? Osteoporosis Brother    ? Osteoporosis Sister        Allergies:  Allergies   Allergen Reactions   ? Aspirin ANAPHYLAXIS   ? Pcn [Penicillins] ANAPHYLAXIS   ? Reglan [Metoclopramide] SEE COMMENTS     Severe leg shakiness   ? Adhesive Tape (Rosins) RASH   ? Sulfa (Sulfonamide Antibiotics) NAUSEA AND VOMITING   ? Omeprazole NAUSEA AND VOMITING   ? Prednisone NAUSEA AND VOMITING       Objective:         ? acetaminophen (TYLENOL) 500 mg tablet Take 500 mg by mouth every 6 hours as needed for Pain. Max of 4,000 mg of acetaminophen in 24 hours.   ? ascorbic acid (VITAMIN C PO) Take  by mouth.   ? baclofen (LIORESAL) 20 mg tablet Take 20 mg by mouth twice daily.   ? cetirizine (ZYRTEC) 10 mg tablet Take one tablet by mouth every morning.   ?  ciprofloxacin (CILOXAN) 0.3 % ophthalmic ointment Apply 0.5 inches topically to affected area twice daily.   ? Diethylpropion 75 mg TbER Take 1 tablet by mouth daily.   ? EPINEPHrine(+) (EPIPEN) 1 mg/mL injection pen Inject 0.3 mg to area(s) as directed once as needed.   ? ergocalciferol (vitamin D2) (VITAMIN D PO) Take  by mouth.   ? eszopiclone (LUNESTA) 3 mg tablet Take 3 mg by mouth at bedtime daily.   ? furosemide (LASIX) 40 mg tablet Take 40 mg by mouth every morning.   ? hydroCHLOROthiazide (HYDRODIURIL) 25 mg tablet Take 25 mg by mouth every morning. Take 1/2-1 tab daily.   ? ibuprofen (MOTRIN) 800 mg tablet Take 800 mg by mouth every 8 hours as needed for Pain. Take with food.   ? levothyroxine (SYNTHROID) 150 mcg tablet TAKE 1 TABLET BY MOUTH 6 TIMES PER WEEK TAKE ON EMPTY STOMACH NOT WITH MEDS/FOOD/DRINK   ? ondansetron (ZOFRAN ODT) 8 mg rapid dissolve tablet Dissolve 8 mg by mouth every 8 hours as needed for Nausea or Vomiting. Place on tongue to disolve.   ? other medication 1 Dose. Medication Name & Strength: Gm Phenol ointment    Dose(how many): 1:400    Frequency(how often): Apply topically to the affected area four times daily.   ? OXcarbazepine (TRILEPTAL) 600 mg tablet Take 600 mg by mouth twice daily.   ? potassium chloride SR (K-DUR) 10 mEq tablet Take 10 mEq by mouth twice daily. Take with a meal and a full glass of water.   ? prazosin (MINIPRESS) 1 mg capsule Take 1 mg by mouth at bedtime daily.   ? spironolactone (ALDACTONE) 50 mg tablet Take 50 mg by mouth daily. Take with food.   ? sumatriptan succinate (IMITREX) 100 mg tablet Take 100 mg by mouth as Needed for Migraine symptoms. Take one tablet by mouth at onset of headache. May repeat after 2 hours if needed. Max of 200 mg in 24 hours.   ? SYMBICORT 160-4.5 mcg/actuation inhalation    ? VENTOLIN HFA 90 mcg/actuation inhaler Take 2 puffs by mouth as Needed.   ? zaleplon (SONATA) 5 mg capsule Take 5 mg by mouth at bedtime daily.     Vitals:    05/06/19 1043   BP: 136/79   BP Source: Arm, Left Upper   Patient Position: Sitting   Pulse: 66   Resp: 16   Temp: 36.3 ?C (97.3 ?F)   SpO2: 99%   Weight: 115.2 kg (254 lb)   Height: 167.6 cm (65.98)   PainSc: Four     Body mass index is 41.02 kg/m?Marland Kitchen       Physical Exam    General: WG/WN/WD, morbidly obese, AAOx4, NAD  HEENT: NC/AT  Cardiac: RRR without murmur  Speech: fluent, no dysarthria or aphasia    CN: PERRL, EOMI without restriction or nystagmus, facial sensation symmetric (V1-V3) to LT bilaterally, No facial droop or ptosis, palatal rise symmetric, cough response strong and intact, shoulder shrug symmetric, tongue midline    Strength:   UEs: 5/5 SA/EF/EE/WE/WF  LEs: 5/5 HF/KE/DF/PF  No involuntary movements, no tremor    Sensation: PP decreased below ankles, vibration 12 sec right and 10 sec left GT     DTRs: 2/2 in BR/B/P 1/1A, downgoing plantar reflexes bilaterally    Coordination: normal bilateral FTN without dysmetria, normal bilateral finger tap, coordination normal  Gait: normal primary base and station, no ataxia, tandem cautious wo evident ataxia  Assessment and Plan:    1. Syncope and collapse - ?Hx neurocardiogenic syncope  2. Disturbance of skin sensation - consider polyneuropathy  3. Chronic episodic headache  4. Labile blood pressure - ?cause syncope  5. Chiari I malformation - hx decompression    Plan:  1. Agree with Cardiology and ENT evaluation  2. Tilt table testing/autonomic screen  3. EMG/NCS BLE  4. Consider restart of Propranolol - can be used for NC syncope  5. Fall precautions and prevention recommended.  6. Compliance with recommendations/followup recommended.   7. MRI head wo GAD   8. Monitoring/treatment BP by PCP/Cardiology  9. RTC 4 months

## 2019-05-07 ENCOUNTER — Encounter: Admit: 2019-05-07 | Discharge: 2019-05-07 | Payer: Medicaid Other

## 2019-05-15 ENCOUNTER — Encounter: Admit: 2019-05-15 | Discharge: 2019-05-15 | Payer: Medicaid Other

## 2019-05-26 ENCOUNTER — Encounter: Admit: 2019-05-26 | Discharge: 2019-05-26 | Payer: Medicaid Other

## 2019-05-26 ENCOUNTER — Ambulatory Visit: Admit: 2019-05-26 | Discharge: 2019-05-26 | Payer: Medicaid Other

## 2019-05-26 DIAGNOSIS — E663 Overweight: Secondary | ICD-10-CM

## 2019-05-26 DIAGNOSIS — R110 Nausea: Secondary | ICD-10-CM

## 2019-05-26 DIAGNOSIS — N920 Excessive and frequent menstruation with regular cycle: Secondary | ICD-10-CM

## 2019-05-26 DIAGNOSIS — M1990 Unspecified osteoarthritis, unspecified site: Secondary | ICD-10-CM

## 2019-05-26 DIAGNOSIS — C801 Malignant (primary) neoplasm, unspecified: Secondary | ICD-10-CM

## 2019-05-26 DIAGNOSIS — M2550 Pain in unspecified joint: Secondary | ICD-10-CM

## 2019-05-26 DIAGNOSIS — R0789 Other chest pain: Secondary | ICD-10-CM

## 2019-05-26 DIAGNOSIS — G935 Compression of brain: Secondary | ICD-10-CM

## 2019-05-26 DIAGNOSIS — R002 Palpitations: Secondary | ICD-10-CM

## 2019-05-26 DIAGNOSIS — F419 Anxiety disorder, unspecified: Secondary | ICD-10-CM

## 2019-05-26 DIAGNOSIS — IMO0002 Chiari malformation: Secondary | ICD-10-CM

## 2019-05-26 DIAGNOSIS — R519 Generalized headaches: Secondary | ICD-10-CM

## 2019-05-26 DIAGNOSIS — M549 Dorsalgia, unspecified: Secondary | ICD-10-CM

## 2019-05-26 DIAGNOSIS — J45909 Unspecified asthma, uncomplicated: Secondary | ICD-10-CM

## 2019-05-26 DIAGNOSIS — S0990XA Unspecified injury of head, initial encounter: Secondary | ICD-10-CM

## 2019-05-26 DIAGNOSIS — M479 Spondylosis, unspecified: Secondary | ICD-10-CM

## 2019-05-26 DIAGNOSIS — G43909 Migraine, unspecified, not intractable, without status migrainosus: Secondary | ICD-10-CM

## 2019-05-26 DIAGNOSIS — R079 Chest pain, unspecified: Secondary | ICD-10-CM

## 2019-05-26 DIAGNOSIS — F329 Major depressive disorder, single episode, unspecified: Secondary | ICD-10-CM

## 2019-05-26 DIAGNOSIS — R55 Syncope and collapse: Secondary | ICD-10-CM

## 2019-05-26 DIAGNOSIS — J189 Pneumonia, unspecified organism: Secondary | ICD-10-CM

## 2019-05-26 DIAGNOSIS — G971 Other reaction to spinal and lumbar puncture: Secondary | ICD-10-CM

## 2019-05-26 DIAGNOSIS — I2699 Other pulmonary embolism without acute cor pulmonale: Secondary | ICD-10-CM

## 2019-05-26 DIAGNOSIS — I82409 Acute embolism and thrombosis of unspecified deep veins of unspecified lower extremity: Secondary | ICD-10-CM

## 2019-05-26 DIAGNOSIS — Q0700 Arnold-Chiari syndrome without spina bifida or hydrocephalus: Secondary | ICD-10-CM

## 2019-05-26 DIAGNOSIS — R52 Pain, unspecified: Secondary | ICD-10-CM

## 2019-05-26 DIAGNOSIS — E079 Disorder of thyroid, unspecified: Secondary | ICD-10-CM

## 2019-05-26 DIAGNOSIS — F909 Attention-deficit hyperactivity disorder, unspecified type: Secondary | ICD-10-CM

## 2019-05-26 DIAGNOSIS — R011 Cardiac murmur, unspecified: Secondary | ICD-10-CM

## 2019-05-26 DIAGNOSIS — I10 Essential (primary) hypertension: Secondary | ICD-10-CM

## 2019-05-26 DIAGNOSIS — M797 Fibromyalgia: Secondary | ICD-10-CM

## 2019-05-26 DIAGNOSIS — K219 Gastro-esophageal reflux disease without esophagitis: Secondary | ICD-10-CM

## 2019-05-26 NOTE — Patient Instructions
1.  Reduce spironolactone 25 mg/day.  Take 50 mg in the morning if your systolic BP is > [id number].  2.  Take extra prazosin 1 mg if systolic BP is > 0000000.

## 2019-05-26 NOTE — Assessment & Plan Note
Dr. Alric Ran ordered a head up tilt study and I think she got this scheduled while here today.

## 2019-05-26 NOTE — Progress Notes
Date of Service: 05/26/2019    Tina [name] is a 49 y.o. female.       HPI     Tina [name] was in the [place] office today with a complaint of intermittent palpitations.  Her symptoms sound like frequent PACs or PVCs, but she also may have some episodes of tachycardia.    She is concerned about the fact that her brother has been diagnosed with some form of valve disease.  She is interested in the idea of an echocardiogram.    We mostly talked about her blood pressure and its variability.  She has been evaluated in neurology and a head up tilt study was ordered.    I made some recommendations about something of a tiered approach to her antihypertensive medication dosing, depending on what her blood pressure runs on a given day.    I do not think she is having angina.  She denies any syncope or near syncope.  She has had no TIA or stroke symptoms.         Vitals:    05/26/19 1447   BP: 120/80   BP Source: Arm, Left Upper   Patient Position: Sitting   Pulse: 82   Temp: 36.1 ?C (97 ?F)   TempSrc: Axillary   SpO2: 98%   Weight: 112.5 kg (248 lb)   Height: 1.702 m (5' 7)   PainSc: Zero     Body mass index is 38.84 kg/m?Marland Kitchen     Past Medical History  Patient Active Problem List    Diagnosis Date Noted   ? Referred otalgia, right 05/06/2019   ? Other chest pain 07/09/2018   ? Sensorineural hearing loss, bilateral 07/10/2017   ? 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 Lymphedema of both lower extremities 11/17/2015     I would rather see her try compression therapy at a clinic rather than using furosemide.  The use of a loop diuretic is likely to exacerbate her tendency to have postural light headedness.     ? Vasovagal syncope 11/17/2015     10/2015 - Syncope while working at the kitchen sink.  Probable vasovagal episode.     ? S/P parathyroidectomy (HCC) 11/24/2014   ? Hypothyroidism, postsurgical 11/24/2014   ? Vitamin D deficiency 10/06/2013   ? Polyarthralgia 06/25/2012   ? Knee swelling 06/25/2012   ? Knee injuries 06/25/2012   ? Obesity 06/25/2012   ? Diabetes (HCC)    ? Hypertension      08/2006 Stress Echo: Normal Ventricular Function With No Regional Wall Motion Abnormalities. Valve Structures Are Unremarkable. Clinical Response: No Symptoms. Stress ECG: No Diagnostic ST Segment Changes. Stress Echo: Was Negative for Ischemia         ? Chronic back pain    ? Rapid palpitations      A. 09/30/08 OV with Dr. Mellody Life and will do a non-looping event recorder to attempt to document her symptoms.     ? 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 Dyspnea 09/14/2008   ? Menorrhagia 12/15/2006         Review of Systems   Constitution: Negative.   HENT: Negative.    Eyes: Negative.    Cardiovascular: Positive for chest pain, dyspnea on exertion, irregular heartbeat, leg swelling and palpitations.   Respiratory: Positive for shortness of breath.    Endocrine: Negative.    Hematologic/Lymphatic: Negative.    Skin: Negative.    Musculoskeletal: Negative.    Gastrointestinal:  Negative.    Genitourinary: Negative.    Neurological: Positive for light-headedness.   Psychiatric/Behavioral: Negative.    Allergic/Immunologic: Negative.        Physical Exam    Physical Exam   General Appearance: no distress   Skin: warm, no ulcers or xanthomas   Digits and Nails: no cyanosis or clubbing   Eyes: conjunctivae and lids normal, pupils are equal and round   Teeth/Gums/Palate: dentition unremarkable, no lesions   Lips & Oral Mucosa: no pallor or cyanosis   Neck Veins: normal JVP , neck veins are not distended   Thyroid: no nodules, masses, tenderness or enlargement   Chest Inspection: chest is normal in appearance   Respiratory Effort: breathing comfortably, no respiratory distress   Auscultation/Percussion: lungs clear to auscultation, no rales or rhonchi, no wheezing   PMI: PMI not enlarged or displaced   Cardiac Rhythm: regular rhythm and normal rate   Cardiac Auscultation: S1, S2 normal, no rub, no gallop   Murmurs: no murmur   Peripheral Circulation: normal peripheral circulation   Carotid Arteries: normal carotid upstroke bilaterally, no bruits   Radial Arteries: normal symmetric radial pulses   Abdominal Aorta: no abdominal aortic bruit   Pedal Pulses: normal symmetric pedal pulses   Lower Extremity Edema: no lower extremity edema   Abdominal Exam: soft, non-tender, no masses, bowel sounds normal   Liver & Spleen: no organomegaly   Gait & Station: walks without assistance   Muscle Strength: normal muscle tone   Orientation: oriented to time, place and person   Affect & Mood: appropriate and sustained affect   Language and Memory: patient responsive and seems to comprehend information   Neurologic Exam: neurological assessment grossly intact   Other: moves all extremities      Cardiovascular Studies    EKG:  Sinus rhythm, rate 79.  Normal tracing.    Problems Addressed Today  Encounter Diagnoses   Name Primary?   ? Other chest pain Yes   ? Vasovagal syncope    ? Essential hypertension    ? Rapid palpitations        Assessment and Plan       Vasovagal syncope  Dr. Sunday Corn ordered a head up tilt study and I think she got this scheduled while here today.    Hypertension  I suggested that she reduce her spironolactone to 25 mg a day, but take 50 mg if her systolic blood pressure is higher than 140.  I have asked her to take one of her prazosin 1 mg tablets in the morning if the systolic exceeds 161.  Hopefully with this tiered approach will help deal with the days when her pressure is high but not overmedicate her on days when it is lower.    Rapid palpitations  I suggested that we start with an echocardiogram and will try to get this scheduled on the same day she is having her head up tilt study.      Current Medications (including today's revisions)  ? acetaminophen (TYLENOL) 500 mg tablet Take 500 mg by mouth every 6 hours as needed for Pain. Max of 4,000 mg of acetaminophen in 24 hours.   ? ascorbic acid (VITAMIN C PO) Take  by mouth.   ? baclofen (LIORESAL) 20 mg tablet Take 20 mg by mouth twice daily.   ? cetirizine (ZYRTEC) 10 mg tablet Take one tablet by mouth every morning.   ? ciprofloxacin (CILOXAN) 0.3 % ophthalmic ointment Apply 0.5 inches topically to affected  area twice daily.   ? Diethylpropion 75 mg TbER Take 1 tablet by mouth daily.   ? EPINEPHrine(+) (EPIPEN) 1 mg/mL injection pen Inject 0.3 mg to area(s) as directed once as needed.   ? ergocalciferol (vitamin D2) (VITAMIN D PO) Take  by mouth.   ? eszopiclone (LUNESTA) 3 mg tablet Take 3 mg by mouth at bedtime daily.   ? hydroCHLOROthiazide (HYDRODIURIL) 25 mg tablet Take 25 mg by mouth every morning. Take 1/2-1 tab daily.   ? ibuprofen (MOTRIN) 800 mg tablet Take 800 mg by mouth every 8 hours as needed for Pain. Take with food.   ? levothyroxine (SYNTHROID) 150 mcg tablet TAKE 1 TABLET BY MOUTH 6 TIMES PER WEEK TAKE ON EMPTY STOMACH NOT WITH MEDS/FOOD/DRINK   ? ondansetron (ZOFRAN ODT) 8 mg rapid dissolve tablet Dissolve 8 mg by mouth every 8 hours as needed for Nausea or Vomiting. Place on tongue to disolve.   ? other medication 1 Dose. Medication Name & Strength: Gm Phenol ointment    Dose(how many): 1:400    Frequency(how often): Apply topically to the affected area four times daily.   ? OXcarbazepine (TRILEPTAL) 600 mg tablet Take 600 mg by mouth twice daily.   ? potassium chloride SR (K-DUR) 10 mEq tablet Take 10 mEq by mouth twice daily. Take with a meal and a full glass of water.   ? prazosin (MINIPRESS) 1 mg capsule Take 1 mg by mouth at bedtime daily.   ? spironolactone (ALDACTONE) 50 mg tablet Take 50 mg by mouth daily. Take with food.   ? sumatriptan succinate (IMITREX) 100 mg tablet Take 100 mg by mouth as Needed for Migraine symptoms. Take one tablet by mouth at onset of headache. May repeat after 2 hours if needed. Max of 200 mg in 24 hours.   ? SYMBICORT 160-4.5 mcg/actuation inhalation    ? VENTOLIN HFA 90 mcg/actuation inhaler Take 2 puffs by mouth as Needed.   ? zaleplon (SONATA) 5 mg capsule Take 5 mg by mouth at bedtime daily.     Total time spent on today's office visit was 30 minutes.  This includes face-to-face in person visit with patient as well as nonface-to-face time including review of the EMR, outside records, labs, radiologic studies, echocardiogram & other cardiovascular studies, formation of treatment plan, after visit summary, future disposition, and lastly on documentation.

## 2019-05-26 NOTE — Assessment & Plan Note
I suggested that she reduce her spironolactone to 25 mg a day, but take 50 mg if her systolic blood pressure is higher than 140.  I have asked her to take one of her prazosin 1 mg tablets in the morning if the systolic exceeds 0000000.  Hopefully with this tiered approach will help deal with the days when her pressure is high but not overmedicate her on days when it is lower.

## 2019-05-26 NOTE — Assessment & Plan Note
I suggested that we start with an echocardiogram and will try to get this scheduled on the same day she is having her head up tilt study.

## 2019-05-27 ENCOUNTER — Encounter: Admit: 2019-05-27 | Discharge: 2019-05-27 | Payer: Medicaid Other

## 2019-05-29 ENCOUNTER — Ambulatory Visit: Admit: 2019-05-29 | Discharge: 2019-05-30 | Payer: Medicaid Other

## 2019-05-29 ENCOUNTER — Encounter: Admit: 2019-05-29 | Discharge: 2019-05-29 | Payer: Medicaid Other

## 2019-05-29 DIAGNOSIS — R110 Nausea: Secondary | ICD-10-CM

## 2019-05-29 DIAGNOSIS — I2699 Other pulmonary embolism without acute cor pulmonale: Secondary | ICD-10-CM

## 2019-05-29 DIAGNOSIS — G935 Compression of brain: Secondary | ICD-10-CM

## 2019-05-29 DIAGNOSIS — I82409 Acute embolism and thrombosis of unspecified deep veins of unspecified lower extremity: Secondary | ICD-10-CM

## 2019-05-29 DIAGNOSIS — M797 Fibromyalgia: Secondary | ICD-10-CM

## 2019-05-29 DIAGNOSIS — M549 Dorsalgia, unspecified: Secondary | ICD-10-CM

## 2019-05-29 DIAGNOSIS — M479 Spondylosis, unspecified: Secondary | ICD-10-CM

## 2019-05-29 DIAGNOSIS — R52 Pain, unspecified: Secondary | ICD-10-CM

## 2019-05-29 DIAGNOSIS — N920 Excessive and frequent menstruation with regular cycle: Secondary | ICD-10-CM

## 2019-05-29 DIAGNOSIS — S0990XA Unspecified injury of head, initial encounter: Secondary | ICD-10-CM

## 2019-05-29 DIAGNOSIS — C801 Malignant (primary) neoplasm, unspecified: Secondary | ICD-10-CM

## 2019-05-29 DIAGNOSIS — Q0700 Arnold-Chiari syndrome without spina bifida or hydrocephalus: Secondary | ICD-10-CM

## 2019-05-29 DIAGNOSIS — G971 Other reaction to spinal and lumbar puncture: Secondary | ICD-10-CM

## 2019-05-29 DIAGNOSIS — R011 Cardiac murmur, unspecified: Secondary | ICD-10-CM

## 2019-05-29 DIAGNOSIS — F329 Major depressive disorder, single episode, unspecified: Secondary | ICD-10-CM

## 2019-05-29 DIAGNOSIS — E890 Postprocedural hypothyroidism: Principal | ICD-10-CM

## 2019-05-29 DIAGNOSIS — R079 Chest pain, unspecified: Secondary | ICD-10-CM

## 2019-05-29 DIAGNOSIS — E079 Disorder of thyroid, unspecified: Secondary | ICD-10-CM

## 2019-05-29 DIAGNOSIS — K219 Gastro-esophageal reflux disease without esophagitis: Secondary | ICD-10-CM

## 2019-05-29 DIAGNOSIS — G43909 Migraine, unspecified, not intractable, without status migrainosus: Secondary | ICD-10-CM

## 2019-05-29 DIAGNOSIS — M1990 Unspecified osteoarthritis, unspecified site: Secondary | ICD-10-CM

## 2019-05-29 DIAGNOSIS — J189 Pneumonia, unspecified organism: Secondary | ICD-10-CM

## 2019-05-29 DIAGNOSIS — R002 Palpitations: Secondary | ICD-10-CM

## 2019-05-29 DIAGNOSIS — J45909 Unspecified asthma, uncomplicated: Secondary | ICD-10-CM

## 2019-05-29 DIAGNOSIS — M2550 Pain in unspecified joint: Secondary | ICD-10-CM

## 2019-05-29 DIAGNOSIS — F419 Anxiety disorder, unspecified: Secondary | ICD-10-CM

## 2019-05-29 DIAGNOSIS — R519 Generalized headaches: Secondary | ICD-10-CM

## 2019-05-29 DIAGNOSIS — E892 Postprocedural hypoparathyroidism: Secondary | ICD-10-CM

## 2019-05-29 DIAGNOSIS — F909 Attention-deficit hyperactivity disorder, unspecified type: Secondary | ICD-10-CM

## 2019-05-29 DIAGNOSIS — R0789 Other chest pain: Secondary | ICD-10-CM

## 2019-05-29 DIAGNOSIS — E28319 Asymptomatic premature menopause: Secondary | ICD-10-CM

## 2019-05-29 DIAGNOSIS — IMO0002 Chiari malformation: Secondary | ICD-10-CM

## 2019-05-29 DIAGNOSIS — E663 Overweight: Secondary | ICD-10-CM

## 2019-05-29 NOTE — Patient Instructions
It was great to see you today.    Issues we are addressing:  Thyroid - get labs at next draw. Please hold any supplements containing biotin for 3-5 days prior to lab draw.    Bones - get bone density and Vit D checked at next lab draw      Please make an appointment to come back to clinic in 6 months. I encourage you to do this as soon as possible.     How to get an Appointment:   Call [phone], Option 1 for scheduling.  Our office hours are 7am - 4:30pm Monday through Friday.    My nurse is April. You can reach her at [phone]    You can also reach me by sending a secure email message through [facility] email that links directly to your chart.    How to get your test results:   We will contact you by MyChart or phone with any test results when they are available.    If you are expecting results and have not heard from [place] within 2 weeks of your testing, please send a MyChart message or call the office.    How to get a Medication Refill:   Please use the MyChart Refill request or contact your pharmacy directly to request medication refills.   Please allow at least 72 hours for refill requests      Your time is important and if you had to wait at all today, I sincerely apologize. My goal is to run exactly on time; however, on occasion, I get behind in clinic due to unexpected patient issues.  Your satisfaction with the care you received today is of utmost importance.  Please contact me directly via MyChart if there are any issues.    Take care,     Lajean Manes, MD

## 2019-05-30 DIAGNOSIS — E559 Vitamin D deficiency, unspecified: Secondary | ICD-10-CM

## 2019-06-01 ENCOUNTER — Encounter: Admit: 2019-06-01 | Discharge: 2019-06-01 | Payer: Medicaid Other

## 2019-06-01 DIAGNOSIS — Z20822 Encounter for screening laboratory testing for COVID-19 virus in asymptomatic patient: Secondary | ICD-10-CM

## 2019-06-01 NOTE — Telephone Encounter
Spoke with patient about ANS/tilt table testing scheduled 07/01/19. Verbal and my chart instructions were provided to the patient. Patient gave v/u of instructions and COVID test is scheduled 06/29/19. BC

## 2019-06-01 NOTE — Progress Notes
You have been scheduled for Autonomic Function Testing with Tilt on July 01 2019 at 1:00 PM .    This test will examine how your autonomic nervous system regulates body functions such as pulse, blood pressure, and sweating.  To get the most accurate results, you should follow these instructions to prepare for this test.    1.)You will need to have someone accompany you to your appointment. They will need to stay in the waiting room during the testing.  2.)Avoid alcohol and caffeine (including decaffeinated products and chocolate) 24 hours prior to testing.    3.)Nothing to eat or drink for 4 hours prior to testing(nothing after 9am if testing is scheduled for 1pm).  4.)No tobacco products(Cigarettes, chewing tobacco, marijuana and vaporizers) for 8 hours prior to testing.  5.)No cold or allergy medications for 24 hours prior to testing.   6.)No compression stockings, belts or restrictive garments. Please wear comfortable clothing as we will need to access your left leg and chest area.  7.)Do not put any lotion on the day of testing.     8.) Medication:       Hold for 24 hours prior to testing:    Cetrizine     HOLD the morning of the testing:      Baclofen  hydroCHLOROthiazide  ondansetron  Spironolactone  sumatriptan succinate (IMITREX    9.)  Please bring your medications with you to the appointment.    10.) If you are of child bearing age our office will require you to provide a urine sample for a pregnancy test upon arrival.     If you have any questions please let me know.     Thanks  Romelle Starcher     P# [phone]

## 2019-06-04 ENCOUNTER — Encounter: Admit: 2019-06-04 | Discharge: 2019-06-04 | Payer: Medicaid Other

## 2019-06-04 ENCOUNTER — Ambulatory Visit: Admit: 2019-06-04 | Discharge: 2019-06-04 | Payer: Medicaid Other

## 2019-06-04 DIAGNOSIS — E079 Disorder of thyroid, unspecified: Secondary | ICD-10-CM

## 2019-06-04 DIAGNOSIS — G971 Other reaction to spinal and lumbar puncture: Secondary | ICD-10-CM

## 2019-06-04 DIAGNOSIS — R0789 Other chest pain: Secondary | ICD-10-CM

## 2019-06-04 DIAGNOSIS — R519 Generalized headaches: Secondary | ICD-10-CM

## 2019-06-04 DIAGNOSIS — M797 Fibromyalgia: Secondary | ICD-10-CM

## 2019-06-04 DIAGNOSIS — F419 Anxiety disorder, unspecified: Secondary | ICD-10-CM

## 2019-06-04 DIAGNOSIS — J45909 Unspecified asthma, uncomplicated: Secondary | ICD-10-CM

## 2019-06-04 DIAGNOSIS — M479 Spondylosis, unspecified: Secondary | ICD-10-CM

## 2019-06-04 DIAGNOSIS — I2699 Other pulmonary embolism without acute cor pulmonale: Secondary | ICD-10-CM

## 2019-06-04 DIAGNOSIS — C801 Malignant (primary) neoplasm, unspecified: Secondary | ICD-10-CM

## 2019-06-04 DIAGNOSIS — F909 Attention-deficit hyperactivity disorder, unspecified type: Secondary | ICD-10-CM

## 2019-06-04 DIAGNOSIS — IMO0002 Chiari malformation: Secondary | ICD-10-CM

## 2019-06-04 DIAGNOSIS — M47815 Spondylosis without myelopathy or radiculopathy, thoracolumbar region: Secondary | ICD-10-CM

## 2019-06-04 DIAGNOSIS — N920 Excessive and frequent menstruation with regular cycle: Secondary | ICD-10-CM

## 2019-06-04 DIAGNOSIS — R269 Unspecified abnormalities of gait and mobility: Secondary | ICD-10-CM

## 2019-06-04 DIAGNOSIS — R011 Cardiac murmur, unspecified: Secondary | ICD-10-CM

## 2019-06-04 DIAGNOSIS — M2550 Pain in unspecified joint: Secondary | ICD-10-CM

## 2019-06-04 DIAGNOSIS — R079 Chest pain, unspecified: Secondary | ICD-10-CM

## 2019-06-04 DIAGNOSIS — E559 Vitamin D deficiency, unspecified: Secondary | ICD-10-CM

## 2019-06-04 DIAGNOSIS — E890 Postprocedural hypothyroidism: Secondary | ICD-10-CM

## 2019-06-04 DIAGNOSIS — E28319 Asymptomatic premature menopause: Secondary | ICD-10-CM

## 2019-06-04 DIAGNOSIS — G43909 Migraine, unspecified, not intractable, without status migrainosus: Secondary | ICD-10-CM

## 2019-06-04 DIAGNOSIS — R52 Pain, unspecified: Secondary | ICD-10-CM

## 2019-06-04 DIAGNOSIS — M1990 Unspecified osteoarthritis, unspecified site: Secondary | ICD-10-CM

## 2019-06-04 DIAGNOSIS — K219 Gastro-esophageal reflux disease without esophagitis: Secondary | ICD-10-CM

## 2019-06-04 DIAGNOSIS — Q0700 Arnold-Chiari syndrome without spina bifida or hydrocephalus: Secondary | ICD-10-CM

## 2019-06-04 DIAGNOSIS — R110 Nausea: Secondary | ICD-10-CM

## 2019-06-04 DIAGNOSIS — S0990XA Unspecified injury of head, initial encounter: Secondary | ICD-10-CM

## 2019-06-04 DIAGNOSIS — R55 Syncope and collapse: Secondary | ICD-10-CM

## 2019-06-04 DIAGNOSIS — R002 Palpitations: Secondary | ICD-10-CM

## 2019-06-04 DIAGNOSIS — F329 Major depressive disorder, single episode, unspecified: Secondary | ICD-10-CM

## 2019-06-04 DIAGNOSIS — J189 Pneumonia, unspecified organism: Secondary | ICD-10-CM

## 2019-06-04 DIAGNOSIS — I82409 Acute embolism and thrombosis of unspecified deep veins of unspecified lower extremity: Secondary | ICD-10-CM

## 2019-06-04 DIAGNOSIS — J4520 Mild intermittent asthma, uncomplicated: Secondary | ICD-10-CM

## 2019-06-04 DIAGNOSIS — E663 Overweight: Secondary | ICD-10-CM

## 2019-06-04 DIAGNOSIS — G935 Compression of brain: Secondary | ICD-10-CM

## 2019-06-04 DIAGNOSIS — M549 Dorsalgia, unspecified: Secondary | ICD-10-CM

## 2019-06-04 LAB — FREE T4 (FREE THYROXINE) ONLY: Lab: 1.2 ng/dL (ref 0.6–1.6)

## 2019-06-04 LAB — COMPREHENSIVE METABOLIC PANEL
Lab: 0.9 mg/dL (ref 0.4–1.00)
Lab: 138 MMOL/L (ref 137–147)
Lab: 19 mg/dL (ref 7–25)
Lab: 4.5 g/dL (ref 3.5–5.0)
Lab: 4.6 MMOL/L (ref 3.5–5.1)
Lab: 8 g/dL (ref 6.0–8.0)
Lab: 91 mg/dL (ref 70–100)

## 2019-06-04 LAB — THYROID STIMULATING HORMONE-TSH: Lab: 1.7 uU/mL (ref 0.35–5.00)

## 2019-06-04 NOTE — Progress Notes
[facility] CLINIC NOTE       SUBJECTIVE: Tina [name] presents in follow-up for ongoing care regarding neck and thoracic pain.  Her main complaint is pain in the periscapular region.  It is limiting her ability to abduct her arms due to pain.  The pain is intermittently sharp and dull.  A CT angiogram of the chest revealed disc space narrowing in the upper thoracic spine         Review of Systems   Constitutional: Positive for activity change.   Musculoskeletal: Positive for arthralgias, back pain, neck pain and neck stiffness.   All other systems reviewed and are negative.      Current Outpatient Medications:   ?  acetaminophen (TYLENOL) 500 mg tablet, Take 500 mg by mouth every 6 hours as needed for Pain. Max of 4,000 mg of acetaminophen in 24 hours., Disp: , Rfl:   ?  baclofen (LIORESAL) 20 mg tablet, Take 20 mg by mouth twice daily., Disp: , Rfl:   ?  cetirizine (ZYRTEC) 10 mg tablet, Take one tablet by mouth every morning., Disp: 90 tablet, Rfl: 3  ?  ciprofloxacin (CILOXAN) 0.3 % ophthalmic ointment, Apply 0.5 inches topically to affected area twice daily., Disp: , Rfl:   ?  EPINEPHrine(+) (EPIPEN) 1 mg/mL injection pen, Inject 0.3 mg to area(s) as directed once as needed., Disp: , Rfl:   ?  eszopiclone (LUNESTA) 3 mg tablet, Take 3 mg by mouth at bedtime daily., Disp: , Rfl:   ?  hydroCHLOROthiazide (HYDRODIURIL) 25 mg tablet, Take 25 mg by mouth every morning. Take 1/2-1 tab daily., Disp: , Rfl:   ?  ibuprofen (MOTRIN) 800 mg tablet, Take 800 mg by mouth every 8 hours as needed for Pain. Take with food., Disp: , Rfl:   ?  levothyroxine (SYNTHROID) 150 mcg tablet, TAKE 1 TABLET BY MOUTH 6 TIMES PER WEEK TAKE ON EMPTY STOMACH NOT WITH MEDS/FOOD/DRINK, Disp: 90 tablet, Rfl: 1  ?  ondansetron (ZOFRAN ODT) 8 mg rapid dissolve tablet, Dissolve 8 mg by mouth every 8 hours as needed for Nausea or Vomiting. Place on tongue to disolve., Disp: , Rfl:   ?  potassium chloride SR (K-DUR) 10 mEq tablet, Take 10 mEq by mouth twice daily. Take with a meal and a full glass of water., Disp: , Rfl:   ?  prazosin (MINIPRESS) 1 mg capsule, Take 1 mg by mouth at bedtime daily., Disp: , Rfl:   ?  spironolactone (ALDACTONE) 50 mg tablet, Take 50 mg by mouth daily. Take with food., Disp: , Rfl:   ?  sumatriptan succinate (IMITREX) 100 mg tablet, Take 100 mg by mouth as Needed for Migraine symptoms. Take one tablet by mouth at onset of headache. May repeat after 2 hours if needed. Max of 200 mg in 24 hours., Disp: , Rfl:   ?  SYMBICORT 160-4.5 mcg/actuation inhalation, , Disp: , Rfl:   ?  VENTOLIN HFA 90 mcg/actuation inhaler, Take 2 puffs by mouth as Needed., Disp: , Rfl: 3  Allergies   Allergen Reactions   ? Aspirin ANAPHYLAXIS   ? Pcn [Penicillins] ANAPHYLAXIS   ? Reglan [Metoclopramide] SEE COMMENTS     Severe leg shakiness   ? Adhesive Tape (Rosins) RASH   ? Sulfa (Sulfonamide Antibiotics) NAUSEA AND VOMITING   ? Omeprazole NAUSEA AND VOMITING   ? Prednisone NAUSEA AND VOMITING     Physical Exam  Vitals:    06/04/19 0922   BP: (!) 140/77  BP Source: Arm, Left Upper   Patient Position: Sitting   Pulse: 72   Resp: 16   Temp: 36.8 ?C (98.2 ?F)   TempSrc: Oral   SpO2: 100%   Weight: 116.1 kg (256 lb)   Height: 170.2 cm (67.01)   PainSc: Eight     Oswestry Total Score:: 26  Pain Score: Eight  Body mass index is 40.09 kg/m?Marland Kitchen  General: Alert and oriented, very pleasant female.   HEENT showed extraocular muscles were intact and no other abnormalities.  Unlabored breathing.  Regular rate and rhythm on CV exam.   5/5 strength in bilateral upper and lower extremities.    Sensation is intact to light touch and equal in the upper and lower extremities.  There is bilateral periscapular tenderness with trigger points palpable         IMPRESSION:  1. 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

## 2019-06-08 ENCOUNTER — Encounter: Admit: 2019-06-08 | Discharge: 2019-06-08 | Payer: Medicaid Other

## 2019-06-08 NOTE — Telephone Encounter
Patient left a VM asking for lab results.  Results in O2 and message from provider to pt in my chart just minutes after her VM was left.    Viewed by [name] on 06/08/2019 3:51 PM  Written by Lajean Manes, MD on 06/08/2019 3:50 PM  Tina [name], thyroid is back to normal - great news. Vit D is good, we wanted more than 30. Kidney function and electrolytes are great. I also saw your bone density - that is normal - no osteoporosis. All in all great report. No need for bone density for 5 years unless unexpected fracture. Please let me know if you have any additional questions or concerns.       Closing encounter.

## 2019-06-11 ENCOUNTER — Encounter: Admit: 2019-06-11 | Discharge: 2019-06-11 | Payer: Medicaid Other

## 2019-06-16 ENCOUNTER — Ambulatory Visit: Admit: 2019-06-16 | Discharge: 2019-06-16 | Payer: Medicaid Other

## 2019-06-16 ENCOUNTER — Encounter: Admit: 2019-06-16 | Discharge: 2019-06-16 | Payer: Medicaid Other

## 2019-06-16 NOTE — Procedures
Please refer to scanned EMG/NCS report for study results.  No separate note otherwise placed for procedure.      Patient well tolerated the procedure and was sent home with no complications.  Time out was taken today to confirm patient identity.      Patient will followup with referring provider for further instructions and workup as needed.

## 2019-07-01 MED ORDER — SODIUM CHLORIDE 0.9 % IV SOLP
1000 mL | INTRAVENOUS | 0 refills | Status: CP
Start: 2019-07-01 — End: ?
  Administered 2019-07-01: 19:00:00 1000 mL via INTRAVENOUS

## 2019-07-01 MED ORDER — NITROGLYCERIN 0.4 MG SL SUBL
.4 mg | SUBLINGUAL | 0 refills | Status: DC | PRN
Start: 2019-07-01 — End: 2019-07-06
  Administered 2019-07-01: 19:00:00 0.4 mg via SUBLINGUAL

## 2019-07-01 MED ORDER — AMMONIA AROMATIC 15 % (W/V) IN SOLN
1 | Freq: Once | RESPIRATORY_TRACT | 0 refills | Status: DC
Start: 2019-07-01 — End: 2019-07-06

## 2019-07-07 ENCOUNTER — Encounter: Admit: 2019-07-07 | Discharge: 2019-07-07 | Payer: Medicaid Other

## 2019-07-08 ENCOUNTER — Encounter: Admit: 2019-07-08 | Discharge: 2019-07-08 | Payer: Medicaid Other

## 2019-07-08 ENCOUNTER — Ambulatory Visit: Admit: 2019-07-08 | Discharge: 2019-07-08 | Payer: Medicaid Other

## 2019-07-08 DIAGNOSIS — L820 Inflamed seborrheic keratosis: Secondary | ICD-10-CM

## 2019-07-08 DIAGNOSIS — R0789 Other chest pain: Secondary | ICD-10-CM

## 2019-07-08 DIAGNOSIS — M1990 Unspecified osteoarthritis, unspecified site: Secondary | ICD-10-CM

## 2019-07-08 DIAGNOSIS — H903 Sensorineural hearing loss, bilateral: Secondary | ICD-10-CM

## 2019-07-08 DIAGNOSIS — N920 Excessive and frequent menstruation with regular cycle: Secondary | ICD-10-CM

## 2019-07-08 DIAGNOSIS — C801 Malignant (primary) neoplasm, unspecified: Secondary | ICD-10-CM

## 2019-07-08 DIAGNOSIS — R079 Chest pain, unspecified: Secondary | ICD-10-CM

## 2019-07-08 DIAGNOSIS — R52 Pain, unspecified: Secondary | ICD-10-CM

## 2019-07-08 DIAGNOSIS — F419 Anxiety disorder, unspecified: Secondary | ICD-10-CM

## 2019-07-08 DIAGNOSIS — R002 Palpitations: Secondary | ICD-10-CM

## 2019-07-08 DIAGNOSIS — IMO0002 Chiari malformation: Secondary | ICD-10-CM

## 2019-07-08 DIAGNOSIS — F909 Attention-deficit hyperactivity disorder, unspecified type: Secondary | ICD-10-CM

## 2019-07-08 DIAGNOSIS — G935 Compression of brain: Secondary | ICD-10-CM

## 2019-07-08 DIAGNOSIS — G971 Other reaction to spinal and lumbar puncture: Secondary | ICD-10-CM

## 2019-07-08 DIAGNOSIS — D229 Melanocytic nevi, unspecified: Secondary | ICD-10-CM

## 2019-07-08 DIAGNOSIS — M549 Dorsalgia, unspecified: Secondary | ICD-10-CM

## 2019-07-08 DIAGNOSIS — E663 Overweight: Secondary | ICD-10-CM

## 2019-07-08 DIAGNOSIS — J189 Pneumonia, unspecified organism: Secondary | ICD-10-CM

## 2019-07-08 DIAGNOSIS — R519 Generalized headaches: Secondary | ICD-10-CM

## 2019-07-08 DIAGNOSIS — R011 Cardiac murmur, unspecified: Secondary | ICD-10-CM

## 2019-07-08 DIAGNOSIS — H9042 Sensorineural hearing loss, unilateral, left ear, with unrestricted hearing on the contralateral side: Secondary | ICD-10-CM

## 2019-07-08 DIAGNOSIS — M479 Spondylosis, unspecified: Secondary | ICD-10-CM

## 2019-07-08 DIAGNOSIS — Q0700 Arnold-Chiari syndrome without spina bifida or hydrocephalus: Secondary | ICD-10-CM

## 2019-07-08 DIAGNOSIS — I82409 Acute embolism and thrombosis of unspecified deep veins of unspecified lower extremity: Secondary | ICD-10-CM

## 2019-07-08 DIAGNOSIS — S0990XA Unspecified injury of head, initial encounter: Secondary | ICD-10-CM

## 2019-07-08 DIAGNOSIS — I2699 Other pulmonary embolism without acute cor pulmonale: Secondary | ICD-10-CM

## 2019-07-08 DIAGNOSIS — F329 Major depressive disorder, single episode, unspecified: Secondary | ICD-10-CM

## 2019-07-08 DIAGNOSIS — E079 Disorder of thyroid, unspecified: Secondary | ICD-10-CM

## 2019-07-08 DIAGNOSIS — M797 Fibromyalgia: Secondary | ICD-10-CM

## 2019-07-08 DIAGNOSIS — L570 Actinic keratosis: Secondary | ICD-10-CM

## 2019-07-08 DIAGNOSIS — H9201 Otalgia, right ear: Secondary | ICD-10-CM

## 2019-07-08 DIAGNOSIS — R110 Nausea: Secondary | ICD-10-CM

## 2019-07-08 DIAGNOSIS — M2550 Pain in unspecified joint: Secondary | ICD-10-CM

## 2019-07-08 DIAGNOSIS — J45909 Unspecified asthma, uncomplicated: Secondary | ICD-10-CM

## 2019-07-08 DIAGNOSIS — K219 Gastro-esophageal reflux disease without esophagitis: Secondary | ICD-10-CM

## 2019-07-08 DIAGNOSIS — G43909 Migraine, unspecified, not intractable, without status migrainosus: Secondary | ICD-10-CM

## 2019-07-08 NOTE — Progress Notes
ATTESTATION    I personally performed the key portions of the E/M visit, discussed case with resident and concur with resident documentation of history, physical exam, assessment, and treatment plan unless otherwise noted. I performed cryotherapy to lesions today .Patient informed that a blistering reaction is to be expected and that a hypopigmented scar may result.         Staff name:  Darl Pikes, MD Date:  07/08/2019

## 2019-07-08 NOTE — Patient Instructions
Liquid nitrogen therapy Care    - The area that was treated with liquid nitrogen may develop a blister and be slightly sore after the treatment. This is expected.   - Do not pick at a blister if it forms. It should resolve on its own in 1-2 weeks.   - You may apply vaseline with a bandage if you would like.   - Please call us if you develop swelling, significant pain  - Avoid prolonged sun exposure, as this may worsen the cosmetic outcome    Vitamin D  - We recommend Vitamin D supplementation after a fatty meal, especially if there is no contraindications such as kidney stones    Moles  --Common melanocytic nevi (moles) tend to be =6 mm in diameter and symmetric with even pigmentation, round or oval shape, regular outline, and sharp, non-fuzzy border.  --Dermal nevi stick out from the skin, but if they are soft they are usually not worrisome.  --Flaky seemingly stuck-on brown bumps are usually benign keratoses, not moles.  --Bright red smooth bumps that do not bleed are usually benign blood vessel lesions (cherry angiomas), not moles.  --Atypical nevi/clinical features of possible melanoma include asymmetry, border irregularities, color variability, and diameter >6 mm.  The earliest sign of melanoma is usually a rapidly growing mole.  --About half of melanoma arises in an existing mole and up to half on normal skin.  --It is normal to get new moles until the age of 49-67 years old.  --Multiple atypical nevi are a marker of increased risk of melanoma. The risk of melanoma depends also upon the total number of nevi, family and/or personal history of melanoma, and sun exposure history.   --It is important to look at your moles once a month.  It may help to follow them with photos such as on your smart phone.  Looking once a month you can notice rapid changes and call if these occur.  --Using sunscreens and sun avoidance will decrease your risk of developing melanoma.  The best sun protection is sun avoidance, including with clothing such as long sleeves and a broad brimmed hat.  The best sunscreens are SPF 30 or above cream based with zinc oxide.  One ounce (shot glass sized) amount is needed for an adult.  It should be reapplied every 2 hours if possible.  --Any tanning bed use will increase your risk of melanoma significantly.    Sun Protection   UPF/SPF rated clothing (gloves, long sleeves, scarves); broad-brimmed hats (NOT ball caps!)   [employer] additive can increase the SPF value of your everyday clothing   Cowboy hats protect from sun; baseball hats don't   Here are several recommended zinc-based sunscreen brands in aplphabetical order (always read the ingredient list, as many brands have multiple varieties of sunscreens and not all are zinc-based)   Cyndia Bent, [employer], [employer], [place], [place], [employer], [employer],  [employer], Forensic scientist, SkinCeuticals, Vanicream   2 shot-glases = whole body    Melanoma Patient Information    Also called malignant melanoma     Skin cancer screening: If you notice a mole that differs from others or one that changes, bleeds, or itches, see a dermatologist.   Melanoma is a type of skin cancer. Anyone can get melanoma. When found early and treated, the cure rate is nearly 100%. Allowed to grow, melanoma can spread to other parts of the body. Melanoma can spread quickly. When melanoma spreads, it can  be deadly.Dermatologists believe that the number of deaths from melanoma would be much lower if people:  Knew the warning signs of melanoma.   Learned how to examine their skin for signs of skin cancer.   Took the time to examine their skin.   It's important to take time to look at the moles on your skin because this is a good way to find melanoma early. When checking your skin, you should look for the ABCDEs of melanoma.     ABCDE's of melanoma:  When performing monthly skin exams for your moles or new moles, remember the ABCDE's of melanoma:    A - Asymmetry. (Concerning if spot is not symmetric)  B - Border. (Irregular border or notched border are concerning)  C - Color. (Multiple colors or changes in color are concerning.)  D - Diameter. (Larger than 6mm, ie, a pencil eraser, is concerning.)  E - Evolution. (An evolving or changing spot is concerning. If new itch, tenderness, or bleeding develop, these are concerning changes too.  See further explanation below:    Melanoma: Signs and symptoms     Anyone can get melanoma. It's important to take time to look at the moles on your skin because this is a good way to find melanoma early. When checking your skin, you should look for the ABCDEs of melanoma.     ABCDEs of melanoma     A = Asymmetry  One half is unlike the other half.       B = Border  An irregular, scalloped, or poorly defined border.       C = Color  Is varied from one area to another; has shades of tan, brown or black, or is sometimes white, red, or blue.       D = Diameter  Melanomas usually greater than 6mm (the size of a pencil eraser) when diagnosed, but they can be smaller.       E = Evolving  A mole or skin lesion that looks different from the rest or is changing in size, shape, or color.    !! If you see a mole or new spot on your skin that has any of the ABCDEs, immediately make an appointment to see a dermatologist.    Signs of melanoma  The most common early signs (what you see) of melanoma are:     Growing mole on your skin.   Unusual looking mole on your skin or a mole that does not look like any other mole on your skin (the ugly duckling).   Non-uniform mole (has an odd shape, uneven or uncertain border, different colors).     Symptoms of melanoma  In the early stages, melanoma may not cause any symptoms (what you feel). But sometimes melanoma will:    Itch.    Bleed.    Feel painful.   Many melanomas have these signs and symptoms, but not all. There are different types of melanoma. One type can first appear as a brown or black streak underneath a fingernail or toenail. Melanoma also can look like a bruise that just won't heal.     Who gets melanoma?  Anyone can get melanoma. Most people who get it have light skin, but people who have brown and black skin also get melanoma.   Some people have a higher risk of getting melanoma. These people have the following traits:   Skin    Fair skin (The risk is higher if the person  also has red or blond hair and blue or green eyes).    Sun-sensitive skin (rarely tans or burns easily).    50-plus moles, large moles, or unusual-looking moles.    If you have had bad sunburns or spent time tanning (sun, tanning beds, or sun lamps), you also have a higher risk of getting melanoma.   Men older than 50 are at a higher risk for developing skin cancers, including melanoma. Learning how to check your skin and getting skin exams can help detect skin cancer.    Family/medical history   Melanoma runs in the family (parent, child, sibling, cousin, aunt, uncle had melanoma).   You had another skin cancer, but most especially another melanoma.   A weakened immune system.      Research shows that indoor tanning increases a person's melanoma risk by 75%. The risk also may increase if you had breast or thyroid cancer.    More people getting melanoma  Fewer people are getting most types of cancer. Melanoma is different. More people are getting melanoma. Many are white men who are 50 years or older. More young people also are getting melanoma. Melanoma is now the most common cancer among people 69-55 years old. Even teenagers are getting melanoma.    What causes melanoma?  Ultraviolet (UV) radiation is a major contributor in most cases. We get UV radiation from the sun, tanning beds, and sun lamps. Heredity also plays a role. Research shows that if a close blood relative (parent, child, sibling, aunt, uncle) had melanoma, a person has a much greater risk of getting melanoma.     How do dermatologists diagnose melanoma?  To diagnose melanoma, a dermatologist begins by looking at the patient's skin. A dermatologist will carefully examine moles and other suspicious spots. To get a better look, a dermatologist may use a device called a dermoscope.      Vitamin D    Our bodies need vitamin D to build strong and healthy bones. Vitamin D helps the body absorb the calcium that our bones require.     For a healthy person, the recommended daily dietary allowance is 600 international units for people of 51-32 years old, and 800 international units for people older than 71 years. More vitamin D is not better. Higher amounts of vitamin D could be harmful, leading to many health problems such as high blood pressure and kidney damage.     American academy of Dermatology recommending everyone get vitamin D from foods naturally rich in vitamin D, foods and beverages fortified with vitamin D or vitamin D supplements. The foods that contain the greatest amount are fatty fishes such as salmon, tuna and mackerel. Fish liver oil is another good source.     One of the sources to look up vitamin amount is through [employer]. [employer]. This can help you find out whether you get enough vitamin D from your diet. If you are like many people, you may not be getting your recommended dietary allowance of vitamin D. You may want to change the foods that you eat or take a vitamin D supplements. Before you start taking a vitamin D supplement, talk with your doctor.     Vitamin D is produced in the skin by UV light, but the amount is highly variable and depends on many factors. However, getting vitamin D from the sun or tanning beds can 1) increase your risk of developing skin cancer including melanoma which can be deadly, 2) resulting  premature skin aging (wrinkles, age spots, blotchy complexion) and 3) leading to a weakened immune system. Therefore, Public affairs consultant of Dermatology recommend getting vitamin D safely from foods, beverages and supplements.

## 2019-07-08 NOTE — Progress Notes
date of Service: 07/08/2019    Subjective:          Tina [name] is a 49 y.o. female.    History of Present Illness  I have her recheck on her hearing.  She describes having difficulty hearing in the left ear.  She also has pain back behind the ear into her temple.  We saw her in the past and some of her responses on the audiogram were inconsistent so we want to get them checked.  She does note that if she covers the left ear she feels like she can hear better.  Has a lot of difficulty with mixed noise.     Review of Systems   Constitutional: Negative.    HENT: Positive for ear discharge and ear pain.    Eyes: Negative.    Respiratory: Negative.    Cardiovascular: Negative.    Gastrointestinal: Negative.    Endocrine: Negative.    Genitourinary: Negative.    Musculoskeletal: Negative.    Skin: Negative.    Allergic/Immunologic: Negative.    Neurological: Negative.    Hematological: Negative.    Psychiatric/Behavioral: Negative.          Objective:         ? acetaminophen (TYLENOL) 500 mg tablet Take 500 mg by mouth every 6 hours as needed for Pain. Max of 4,000 mg of acetaminophen in 24 hours.   ? baclofen (LIORESAL) 20 mg tablet Take 20 mg by mouth twice daily.   ? cetirizine (ZYRTEC) 10 mg tablet Take one tablet by mouth every morning.   ? ciprofloxacin (CILOXAN) 0.3 % ophthalmic ointment Apply 0.5 inches topically to affected area twice daily.   ? EPINEPHrine(+) (EPIPEN) 1 mg/mL injection pen Inject 0.3 mg to area(s) as directed once as needed.   ? eszopiclone (LUNESTA) 3 mg tablet Take 3 mg by mouth at bedtime daily.   ? hydroCHLOROthiazide (HYDRODIURIL) 25 mg tablet Take 25 mg by mouth every morning. Take 1/2-1 tab daily.   ? ibuprofen (MOTRIN) 800 mg tablet Take 800 mg by mouth every 8 hours as needed for Pain. Take with food.   ? levothyroxine (SYNTHROID) 150 mcg tablet TAKE 1 TABLET BY MOUTH 6 TIMES PER WEEK TAKE ON EMPTY STOMACH NOT WITH MEDS/FOOD/DRINK   ? ondansetron (ZOFRAN ODT) 8 mg rapid dissolve tablet Dissolve 8 mg by mouth every 8 hours as needed for Nausea or Vomiting. Place on tongue to disolve.   ? potassium chloride SR (K-DUR) 10 mEq tablet Take 10 mEq by mouth twice daily. Take with a meal and a full glass of water.   ? prazosin (MINIPRESS) 1 mg capsule Take 1 mg by mouth at bedtime daily.   ? spironolactone (ALDACTONE) 50 mg tablet Take 50 mg by mouth daily. Take with food.   ? sumatriptan succinate (IMITREX) 100 mg tablet Take 100 mg by mouth as Needed for Migraine symptoms. Take one tablet by mouth at onset of headache. May repeat after 2 hours if needed. Max of 200 mg in 24 hours.   ? SYMBICORT 160-4.5 mcg/actuation inhalation    ? VENTOLIN HFA 90 mcg/actuation inhaler Take 2 puffs by mouth as Needed.     There were no vitals filed for this visit.  There is no height or weight on file to calculate BMI.     Physical Exam     Constitutional: She appears well-developed and well-nourished.   HENT:   Normal voice  Head: Normocephalic and atraumatic.   Right Ear:  Tympanic membrane, external ear and ear canal normal. No drainage, swelling or tenderness. No middle ear effusion.   Left Ear: Tympanic membrane, external ear and ear canal normal. No drainage, swelling or tenderness.  No middle ear effusion.   Eyes: EOM and lids are normal.   Neck: Trachea normal, normal range of motion and phonation normal. No thyromegaly present.   No adenopathy.  Tender near left occiput area.  Salivary Glands: normal  Cranial nerves: 2-12 normal    Testing:    Audio date 07/08/2019  Right 20/18 dB 100%  Left 22/18 dB 100%  Normal tympanometry.  Right ear normal hearing.  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  This is unchanged from previous exams other than better thresholds         Assessment and Plan:  This is a 49 year old with complaints intermittent left ear pain that starts base of her skull and then over onto her ear.  She is tender over the occiput and may be having occipital neuralgia.  She has seen Dr. Samara Deist for pain clinic and may suggest occipital neuralgia.  She complains of hearing loss in left ear.  Repeated audiograms did not reveal much of anything except a low tone which has been stable.  She may be having more of a processing problem in hearing.  Will not repeat anything at this time.

## 2019-07-08 NOTE — Procedures
Procedure Time Out Check List:  Prior to the start of the procedure, I personally confirmed the following:    Site Marking Verified: Yes, as appropriate  Patient Identity (name & date of birth): Yes  Procedure: Yes  Site: Yes  Body Part: see below    The risks of the procedure, including infection, bleeding, pain and skin changes, were discussed with the patient.    Liquid Nitrogen Procedure Note    Risk and benefits of the above procedure including pain, dyspigmentation, scar, infection, recurrence were discussed with the patient (or legal guardian) in detail, who afterwards decided to proceed with the procedure.    Verbal informed consent given  Diagnosis: (see progress note)  Body site: (see progress note)  Number of lesions: (see progress note)  Cycle duration: 10 sec  Number or cycles: 2   Wound care instructions given: Yes  Complications:  None  Tolerated well:  Yes  Ambulated from room:  Yes  Duration of procedure: > 5min

## 2019-07-09 ENCOUNTER — Encounter: Admit: 2019-07-09 | Discharge: 2019-07-09 | Payer: Medicaid Other

## 2019-07-09 ENCOUNTER — Ambulatory Visit: Admit: 2019-07-09 | Discharge: 2019-07-10 | Payer: Medicaid Other

## 2019-07-10 ENCOUNTER — Ambulatory Visit: Admit: 2019-07-10 | Discharge: 2019-07-11 | Payer: Medicaid Other

## 2019-07-10 ENCOUNTER — Encounter: Admit: 2019-07-10 | Discharge: 2019-07-10 | Payer: Medicaid Other

## 2019-07-10 DIAGNOSIS — G629 Polyneuropathy, unspecified: Secondary | ICD-10-CM

## 2019-07-10 DIAGNOSIS — R55 Syncope and collapse: Secondary | ICD-10-CM

## 2019-07-10 DIAGNOSIS — J45909 Unspecified asthma, uncomplicated: Secondary | ICD-10-CM

## 2019-07-10 DIAGNOSIS — I82409 Acute embolism and thrombosis of unspecified deep veins of unspecified lower extremity: Secondary | ICD-10-CM

## 2019-07-10 DIAGNOSIS — F909 Attention-deficit hyperactivity disorder, unspecified type: Secondary | ICD-10-CM

## 2019-07-10 DIAGNOSIS — F419 Anxiety disorder, unspecified: Secondary | ICD-10-CM

## 2019-07-10 DIAGNOSIS — G43909 Migraine, unspecified, not intractable, without status migrainosus: Secondary | ICD-10-CM

## 2019-07-10 DIAGNOSIS — R52 Pain, unspecified: Secondary | ICD-10-CM

## 2019-07-10 DIAGNOSIS — S0990XA Unspecified injury of head, initial encounter: Secondary | ICD-10-CM

## 2019-07-10 DIAGNOSIS — M479 Spondylosis, unspecified: Secondary | ICD-10-CM

## 2019-07-10 DIAGNOSIS — E663 Overweight: Secondary | ICD-10-CM

## 2019-07-10 DIAGNOSIS — M2550 Pain in unspecified joint: Secondary | ICD-10-CM

## 2019-07-10 DIAGNOSIS — G935 Compression of brain: Secondary | ICD-10-CM

## 2019-07-10 DIAGNOSIS — R002 Palpitations: Secondary | ICD-10-CM

## 2019-07-10 DIAGNOSIS — Q0700 Arnold-Chiari syndrome without spina bifida or hydrocephalus: Secondary | ICD-10-CM

## 2019-07-10 DIAGNOSIS — C801 Malignant (primary) neoplasm, unspecified: Secondary | ICD-10-CM

## 2019-07-10 DIAGNOSIS — R110 Nausea: Secondary | ICD-10-CM

## 2019-07-10 DIAGNOSIS — R0789 Other chest pain: Secondary | ICD-10-CM

## 2019-07-10 DIAGNOSIS — M549 Dorsalgia, unspecified: Secondary | ICD-10-CM

## 2019-07-10 DIAGNOSIS — M1990 Unspecified osteoarthritis, unspecified site: Secondary | ICD-10-CM

## 2019-07-10 DIAGNOSIS — R519 Generalized headaches: Secondary | ICD-10-CM

## 2019-07-10 DIAGNOSIS — R079 Chest pain, unspecified: Secondary | ICD-10-CM

## 2019-07-10 DIAGNOSIS — R011 Cardiac murmur, unspecified: Secondary | ICD-10-CM

## 2019-07-10 DIAGNOSIS — J189 Pneumonia, unspecified organism: Secondary | ICD-10-CM

## 2019-07-10 DIAGNOSIS — K219 Gastro-esophageal reflux disease without esophagitis: Secondary | ICD-10-CM

## 2019-07-10 DIAGNOSIS — IMO0002 Chiari malformation: Secondary | ICD-10-CM

## 2019-07-10 DIAGNOSIS — I2699 Other pulmonary embolism without acute cor pulmonale: Secondary | ICD-10-CM

## 2019-07-10 DIAGNOSIS — F329 Major depressive disorder, single episode, unspecified: Secondary | ICD-10-CM

## 2019-07-10 DIAGNOSIS — M797 Fibromyalgia: Secondary | ICD-10-CM

## 2019-07-10 DIAGNOSIS — N920 Excessive and frequent menstruation with regular cycle: Secondary | ICD-10-CM

## 2019-07-10 DIAGNOSIS — E079 Disorder of thyroid, unspecified: Secondary | ICD-10-CM

## 2019-07-10 DIAGNOSIS — G971 Other reaction to spinal and lumbar puncture: Secondary | ICD-10-CM

## 2019-07-10 MED ORDER — MIDODRINE 2.5 MG PO TAB
2.5 mg | ORAL_TABLET | Freq: Two times a day (BID) | ORAL | 5 refills | Status: AC
Start: 2019-07-10 — End: ?

## 2019-07-10 NOTE — Progress Notes
Date of Service: 07/10/2019     Subjective:               Tina [name] is a 49 y.o. female.    Obtained patient's verbal consent to treat them and their agreement to [facility] financial policy and NPP via this telehealth visit during the [facility]      History of Present Illness    I last saw her in clinic for EMG/NCS that showed mixed sensorimotor polyneuropathy.    She has had off/on burning/tingling in the feet.  Feet have not changed.  She has had no change of balance and no recent falls.  Last fall was in 04/2019 when trying to walk over a child gate.      Her previous visit was in March 2021.  I ordered MRI of the head and autonomic screen.  Tilt table test showed abnormal cardio vagal function and pseudomotor function.  No evidence of sympathetic adrenergic dysfunction.    She has had LOC spells.  Last was yesterday while sitting on the chair watching TV.  She awoke on the floor.  She does not know how long spell happened.  She had no convulsion or shaking.  She was alone.  No incontinence.    She has had significant dizziness with standing sometimes.  She denies problems with dryness.  No constipation or diarrhea.    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  Otherwise benign study.    MRI thoracic spine June 16, 2019 showed patent central and foramina..    She saw cardiology discussed the importance of hydration for vasovagal syncope.    She has a history of Chiari procedure by Dr. Danielle Dess several years ago.  We talked about restarting propranolol for neurocardiogenic syncope.    Headaches are doing well with trigger point injections.      She has had no other neurologic changes or compliants.           Review of Systems    Chief Complaint:  Chief Complaint   Patient presents with   ? Follow Up     table       Past Medical History:  Medical History:   Diagnosis Date   ? Acid reflux    ? ADHD (attention deficit hyperactivity disorder)     Have had since I was a child   ? Anxiety     panic attacks   ? Arnold-Chiari malformation (HCC)    ? Arthritis    ? Asthma    ? Cancer [facility])     cervical   ? Chest pain     associated with asthma?   ? 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 Macario Carls in early 2000s   ? Chiari malformation     Dr. Danielle Dess did my surgery at [facility]   ? Chronic back pain 02/11/2009   ? Deep vein thrombosis (DVT) (HCC) 1996    right leg   ? Degenerative arthritis of spine    ? Depression     In the past   ? Fibromyalgia    ? Fibromyalgia    ? Generalized headaches     migraines   ? Head injury    ? Heart murmur     Was noticed during a test   ? HTN    ? Joint pain     Arthritis   ? Menorrhagia    ?  Migraines    ? Nausea    ? Other chest pain 07/09/2018   ? Overweight(278.02)    ? Pain     back, neck, legs   ? Pneumonia     In the past   ? Pulmonary embolism [facility])     My lags   ? Rapid palpitations 02/11/2009   ? Spinal headache     Chiar headache they start my neck and going to my head   ? Thyroid disorder        Surgical History:  Surgical History:   Procedure Laterality Date   ? HYSTERECTOMY  2005    uterus + 1 ovary removed   ? HX KNEE ARTHROSCOPY Left Jan 2016    and Dec 2015   ? TOTAL THYROIDECTOMY, POSSIBLE CENTRAL NECK LYMPH NODE DISSECTION, INTRAOPERATIVE RECURRENT LARYNGEAL NERVE MONITORING, INTRAOPERATIVE PARATHYROID HORMONE MONITORING, PARATHYROIDECTOMY N/A 08/06/2014    Performed by Marcelle Overlie, MD at [name] OR   ? UPPER GASTROINTESTINAL ENDOSCOPY  11/03/2015   ? HX CHOLECYSTECTOMY  04/29/2017   ? HX CESAREAN SECTION     ? HX HYSTERECTOMY      [facility] did it   ? HX KNEE ARTHROSCOPY Right 1990's   ? HX SURGERY      neck area, chiari malformation   ? HX SURGERY      abdominal sx   ? HX TUBAL LIGATION     ? KNEE SURGERY      Both knee have been scoped   ? OOPHORECTOMY      remaining ovary removed       Social History:   Social History     Socioeconomic History   ? Marital status: Divorced Spouse name: Not on file   ? Number of children: Not on file   ? Years of education: Not on file   ? Highest education level: Not on file   Occupational History   ? Not on file   Tobacco Use   ? Smoking status: Former Smoker     Packs/day: 1.50     Years: 15.00     Pack years: 22.50     Types: Cigarettes     Quit date: 07/04/1994     Years since quitting: 25.0   ? Smokeless tobacco: Former Neurosurgeon     Types: Chew   Substance and Sexual Activity   ? Alcohol use: Yes     Alcohol/week: 1.0 standard drinks     Types: 1 Glasses of wine per week     Comment: a drink every year/ special occasion   ? 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 Sexual activity: Not Currently     Partners: Male     Birth control/protection: None   Other Topics Concern   ? Not on file   Social History Narrative   ? Not on file       Family History:  Family History   Problem Relation Age of Onset   ? Cancer Father         My dad passed away of leukemia and lung cancer and another form of cancer   ? Heart Failure Father    ? Migraines Father    ? Alcohol liver disease Father         Was a heavy drinker throughout life. 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  Arthritis Father         Hands lagsand back   ? Back pain Father         Had arthritis in his back he had been paralyzed at one point also was in a motorcycle accident and semi accident   ? Hypertension Father    ? Joint Pain Father    ? Arthritis-rheumatoid Other    ? Seizures Other    ? Mental Retardation Sister    ? Migraines Sister    ? Tremor Sister    ? Diabetes Sister    ? Stroke Maternal Grandfather    ? Stroke Paternal Grandmother    ? Cancer Paternal Grandmother         My grandmother had bone cancer cervical cancer   ? Heart Attack Paternal Grandmother    ? Heart problem Paternal Grandmother    ? Hypertension Paternal Grandmother    ? Joint Pain Paternal Grandmother    ? Osteoporosis Paternal Grandmother    ? Other Other         hyerectomies in age 54s or 48s in her sisters.    ? Heart problem Brother    ? Hypertension Brother    ? Joint Pain Brother    ? Osteoporosis Brother    ? Osteoporosis Sister        Allergies:  Allergies   Allergen Reactions   ? Aspirin ANAPHYLAXIS   ? Ibuprofen ANAPHYLAXIS   ? Pcn [Penicillins] ANAPHYLAXIS   ? Reglan [Metoclopramide] SEE COMMENTS     Severe leg shakiness   ? Adhesive Tape (Rosins) RASH   ? Sulfa (Sulfonamide Antibiotics) NAUSEA AND VOMITING   ? Omeprazole NAUSEA AND VOMITING   ? Prednisone NAUSEA AND VOMITING       Objective:         ? acetaminophen (TYLENOL) 500 mg tablet Take 500 mg by mouth every 6 hours as needed for Pain. Max of 4,000 mg of acetaminophen in 24 hours.   ? baclofen (LIORESAL) 20 mg tablet Take 20 mg by mouth twice daily.   ? cetirizine (ZYRTEC) 10 mg tablet Take one tablet by mouth every morning.   ? ciprofloxacin (CILOXAN) 0.3 % ophthalmic ointment Apply 0.5 inches topically to affected area twice daily.   ? EPINEPHrine(+) (EPIPEN) 1 mg/mL injection pen Inject 0.3 mg to area(s) as directed once as needed.   ? eszopiclone (LUNESTA) 3 mg tablet Take 3 mg by mouth at bedtime daily.   ? hydroCHLOROthiazide (HYDRODIURIL) 25 mg tablet Take 25 mg by mouth every morning. Take 1/2-1 tab daily.   ? ibuprofen (MOTRIN) 800 mg tablet Take 800 mg by mouth every 8 hours as needed for Pain. Take with food.   ? levothyroxine (SYNTHROID) 150 mcg tablet TAKE 1 TABLET BY MOUTH 6 TIMES PER WEEK TAKE ON EMPTY STOMACH NOT WITH MEDS/FOOD/DRINK   ? ondansetron (ZOFRAN ODT) 8 mg rapid dissolve tablet Dissolve 8 mg by mouth every 8 hours as needed for Nausea or Vomiting. Place on tongue to disolve.   ? potassium chloride SR (K-DUR) 10 mEq tablet Take 10 mEq by mouth twice daily. Take with a meal and a full glass of water.   ? prazosin (MINIPRESS) 1 mg capsule Take 1 mg by mouth at bedtime daily.   ? spironolactone (ALDACTONE) 50 mg tablet Take 50 mg by mouth daily. Take with food.   ? sumatriptan succinate (IMITREX) 100 mg tablet Take 100 mg by mouth as Needed for Migraine symptoms. Take  one tablet by mouth at onset of headache. May repeat after 2 hours if needed. Max of 200 mg in 24 hours.   ? SYMBICORT 160-4.5 mcg/actuation inhalation    ? VENTOLIN HFA 90 mcg/actuation inhaler Take 2 puffs by mouth as Needed.     Vitals:    07/10/19 1105   BP: 130/85   Pulse: 77   Weight: 116.1 kg (256 lb)   Height: 170.2 cm (67)   PainSc: Three     Body mass index is 40.1 kg/m?Marland Kitchen       Physical Exam    Gen: WG/WN, NAD  Mental Status: Alert/oriented x4  Speech: fluent wo dysarthria or aphasia    CN: unable to assess reactivity to light via tele, EOMI/full with normal saccades, facial expression symmetric wo droop or ptosis, shoulder shrug symmetric, cough normal, tongue midline wo deviation or atrophy    Motor: moves all limbs equally to command and spontaneoulsy, no pronator drift of upper extremities - unable to assess strength remotely via telemedicine, no abnormal involuntary movements    Reflexes: unable due to telemedicine  Coordination: normal bilateral FTN wo dysmetria    Gait: deferred    Remainder of exam unable to be assessed via telemedicine audio/video since it limits ability for comprehensive neurological exam         Assessment and Plan:    1.  Idiopathic sensorimotor polyneuropathy  2.  Labile blood pressure  3.  Vasovagal syncope  4.  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  Chronic episodic headache    Plan:  1. order Vit B12, TSH, syphilis antibody, ESR, RF, vitamin B6, MMA, SIFE, HIV, fasting 2 hr glucose tolerance test, SPEP, ANA-neuropathy work-up  2.  Awake and drowsy EEG-suspect syncope rather than seizure  3. Start midodrine 2.5 mg BID.  No Florinef - Prednisone allergy caution given labile blood pressure  4. Refer to neuromuscular clinic for possible autonomic neuropathy  5. RTC 4 months.  Driving restrictions placed today- no driving 6 months free of episodes of lost consciousness.  Discussed state law of no driving when spells of losing consciousness present..  So, avoid other potentially dangerous activites also (climbing, bathing heavy machinery etc).

## 2019-07-10 NOTE — Patient Instructions
No driving, climbing to heights, being around open water alone or engaging in other potentially hazardous activity that poses risk of injury to self or others until medically cleared.

## 2019-07-13 ENCOUNTER — Encounter: Admit: 2019-07-13 | Discharge: 2019-07-13 | Payer: Medicaid Other

## 2019-07-13 NOTE — Telephone Encounter
Pt left voicemail requesting call back regarding results of lung function testing.    Attempted to reach pt, but pt did not answer.  Left voicemail for pt to return call.

## 2019-07-13 NOTE — Telephone Encounter
Patient called and LM stating she needs an OV with Dr. Barry Dienes the first week of June to go over her test results. She stated that she is going out of town. Dr. Barry Dienes in out of the office the first week of June. He is rounding 6/7-6/11 week. Dr. Barry Dienes in in clinic the following two weeks. No opening during next week of clinics.    Patient has tilt table test and echo on 4/28. Autonomic functioning testing results are partially abnormal and positive for frank syncope at minute 27 and accompanied by a prodrome of warmth, lightheadedness prior to losing consciousness. Dr. Bradly Bienenstock saw the patient on the same day as tilt due to abnormal results. Note states that the patient had completely different symptoms on the tilt table test than she was having at home. Patient believed the episode happened due to not eating/drinking anything. Patient was told importance of hydration, avoid caffeine, regular meals, and use of TED hose for lower extremity swelling. Echo showed EF 55% with no significant valvular abnormalities. Patient told to follow up with general cardiologist.    I called the patient and she stated that she is going to be gone June 10-July 4. She was OK with [place] location. I stated I would ask Dr. Barry Dienes about working her in the week of June 7.

## 2019-07-13 NOTE — Telephone Encounter
Patient agreed to appt time/date/location. Scheduled.    Vanice Sarah, MD Sent to You 11 minutes ago (1:31 PM)   Yes, that would be OK, thanks.

## 2019-07-30 ENCOUNTER — Encounter: Admit: 2019-07-30 | Discharge: 2019-07-30 | Payer: Medicaid Other

## 2019-07-30 NOTE — Telephone Encounter
Patient called stating that she fell in the shower about a week and a half ago and injured her right arm. She went in for care after fall and X-rays were taken of lower arm. She went back a couple days ago because was still having pain. Was told she may have torn something in her shoulder. Shoulder injury is causing back muscle spasms. Patient was scheduled for appointment with Dr. Samara Deist next week.

## 2019-08-04 ENCOUNTER — Encounter: Admit: 2019-08-04 | Discharge: 2019-08-04 | Payer: Medicaid Other

## 2019-08-04 ENCOUNTER — Ambulatory Visit: Admit: 2019-08-04 | Discharge: 2019-08-04 | Payer: Medicaid Other

## 2019-08-04 DIAGNOSIS — R55 Syncope and collapse: Secondary | ICD-10-CM

## 2019-08-06 ENCOUNTER — Ambulatory Visit: Admit: 2019-08-06 | Discharge: 2019-08-06 | Payer: Medicaid Other

## 2019-08-06 ENCOUNTER — Encounter: Admit: 2019-08-06 | Discharge: 2019-08-06 | Payer: Medicaid Other

## 2019-08-06 DIAGNOSIS — M7918 Myalgia, other site: Secondary | ICD-10-CM

## 2019-08-06 MED ORDER — BUPIVACAINE (PF) 0.25 % (2.5 MG/ML) IJ SOLN
2 mL | Freq: Once | INTRAMUSCULAR | 0 refills | Status: CP | PRN
Start: 2019-08-06 — End: ?
  Administered 2019-08-06: 16:00:00 2 mL via INTRAMUSCULAR

## 2019-08-06 MED ORDER — TRIAMCINOLONE ACETONIDE 40 MG/ML IJ SUSP
40 mg | Freq: Once | INTRAMUSCULAR | 0 refills | Status: CP | PRN
Start: 2019-08-06 — End: ?
  Administered 2019-08-06: 16:00:00 40 mg via INTRAMUSCULAR

## 2019-08-06 NOTE — Procedures
Attending Surgeon: Gabriel Rung, MD    Anesthesia: Local    Pre-Procedure Diagnosis: No diagnosis found.    Post-Procedure Diagnosis: No diagnosis found.    Trigger Point  Locations: L cervical/thoracic/lumbar paraspinal, R cervical/thoracic/lumbar paraspinal, L cervical, R cervical, L thoracic and R thoracic  Consent:   Consent obtained: verbal and written  Consent given by: patient  Alternatives discussed: alternative treatment     Universal Protocol:  Relevant documents: relevant documents present and verified  Test results: test results available and properly labeled  Imaging studies: imaging studies available  Required items: required blood products, implants, devices, and special equipment available  Site marked: the operative site was marked  Patient identity confirmed: Patient identify confirmed verbally with patient.        Time out: Immediately prior to procedure a time out was called to verify the correct patient, procedure, equipment, support staff and site/side marked as required      Procedures Details:   Needle size: 27 G  Number of muscles: 3 or more  Medications administered: 2 mL bupivacaine PF 0.25 %; 40 mg triamcinolone acetonide 40 mg/mL  Patient tolerance: Patient tolerated the procedure well with no immediate complications. Pressure was applied, and hemostasis was accomplished.        Estimated blood loss: none or minimal  Specimens: none  Patient tolerated the procedure well with no immediate complications. Pressure was applied, and hemostasis was accomplished.

## 2019-08-06 NOTE — Progress Notes
[facility] CLINIC NOTE       SUBJECTIVE: Tina [name] presents in follow-up for ongoing care regarding cervical and thoracic pain.  Pain is in the paraspinous muscles where trigger point injections of reduced pain by about 50% for about 3 months at a time she wishes to have them repeated.  Walking standing bending and twisting exacerbates her pain.       Review of Systems    Current Outpatient Medications:   ?  acetaminophen (TYLENOL) 500 mg tablet, Take 500 mg by mouth every 6 hours as needed for Pain. Max of 4,000 mg of acetaminophen in 24 hours., Disp: , Rfl:   ?  baclofen (LIORESAL) 20 mg tablet, Take 20 mg by mouth twice daily., Disp: , Rfl:   ?  cetirizine (ZYRTEC) 10 mg tablet, Take one tablet by mouth every morning., Disp: 90 tablet, Rfl: 3  ?  ciprofloxacin (CILOXAN) 0.3 % ophthalmic ointment, Apply 0.5 inches topically to affected area twice daily., Disp: , Rfl:   ?  EPINEPHrine(+) (EPIPEN) 1 mg/mL injection pen, Inject 0.3 mg to area(s) as directed once as needed., Disp: , Rfl:   ?  hydroCHLOROthiazide (HYDRODIURIL) 25 mg tablet, Take 25 mg by mouth every morning. Take 1/2-1 tab daily., Disp: , Rfl:   ?  levothyroxine (SYNTHROID) 150 mcg tablet, TAKE 1 TABLET BY MOUTH 6 TIMES PER WEEK TAKE ON EMPTY STOMACH NOT WITH MEDS/FOOD/DRINK, Disp: 90 tablet, Rfl: 1  ?  midodrine (PROAMATINE) 2.5 mg tablet, Take one tablet by mouth twice daily., Disp: 60 tablet, Rfl: 5  ?  ondansetron (ZOFRAN ODT) 8 mg rapid dissolve tablet, Dissolve 8 mg by mouth every 8 hours as needed for Nausea or Vomiting. Place on tongue to disolve., Disp: , Rfl:   ?  potassium chloride SR (K-DUR) 10 mEq tablet, Take 10 mEq by mouth twice daily. Take with a meal and a full glass of water., Disp: , Rfl:   ?  prazosin (MINIPRESS) 1 mg capsule, Take 1 mg by mouth at bedtime daily., Disp: , Rfl:   ?  spironolactone (ALDACTONE) 50 mg tablet, Take 50 mg by mouth daily. Take with food., Disp: , Rfl:   ?  sumatriptan succinate (IMITREX) 100 mg tablet, Take 100 mg by mouth as Needed for Migraine symptoms. Take one tablet by mouth at onset of headache. May repeat after 2 hours if needed. Max of 200 mg in 24 hours., Disp: , Rfl:   ?  SYMBICORT 160-4.5 mcg/actuation inhalation, , Disp: , Rfl:   ?  VENTOLIN HFA 90 mcg/actuation inhaler, Take 2 puffs by mouth as Needed., Disp: , Rfl: 3  Allergies   Allergen Reactions   ? Aspirin ANAPHYLAXIS   ? Ibuprofen ANAPHYLAXIS   ? Pcn [Penicillins] ANAPHYLAXIS   ? Reglan [Metoclopramide] SEE COMMENTS     Severe leg shakiness   ? Adhesive Tape (Rosins) RASH   ? Sulfa (Sulfonamide Antibiotics) NAUSEA AND VOMITING   ? Omeprazole NAUSEA AND VOMITING   ? Prednisone NAUSEA AND VOMITING     Physical Exam  Vitals:    08/06/19 1129   BP: 126/80   BP Source: Arm, Right Upper   Patient Position: Sitting   Pulse: 84   Temp: 37.1 ?C (98.7 ?F)   SpO2: 97%   Height: 170.2 cm (67)   PainSc: Seven     Oswestry Total Score:: 28  Pain Score: Seven  Body mass index is 40.1 kg/m?Marland Kitchen  General: Alert and oriented, very pleasant female.  HEENT showed extraocular muscles were intact and no other abnormalities.  Unlabored breathing.  Regular rate and rhythm on CV exam.   5/5 strength in bilateral upper and lower extremities.    Sensation is intact to light touch and equal in the upper and lower extremities.  There is bilateral cervical and thoracic tenderness with trigger points palpable         IMPRESSION:  1. Myalgia, other site          PLAN: Will repeat trigger point injections in the cervical and thoracic region and schedule a 54-month clinic follow-up or sooner if needed.  We will continue with home exercise plan as prescribed by physical therapy.

## 2019-08-10 ENCOUNTER — Ambulatory Visit: Admit: 2019-08-10 | Discharge: 2019-08-11 | Payer: Medicaid Other

## 2019-08-10 ENCOUNTER — Encounter: Admit: 2019-08-10 | Discharge: 2019-08-10 | Payer: Medicaid Other

## 2019-08-10 DIAGNOSIS — G935 Compression of brain: Secondary | ICD-10-CM

## 2019-08-10 DIAGNOSIS — R011 Cardiac murmur, unspecified: Secondary | ICD-10-CM

## 2019-08-10 DIAGNOSIS — Q0700 Arnold-Chiari syndrome without spina bifida or hydrocephalus: Secondary | ICD-10-CM

## 2019-08-10 DIAGNOSIS — M1990 Unspecified osteoarthritis, unspecified site: Secondary | ICD-10-CM

## 2019-08-10 DIAGNOSIS — G971 Other reaction to spinal and lumbar puncture: Secondary | ICD-10-CM

## 2019-08-10 DIAGNOSIS — G43909 Migraine, unspecified, not intractable, without status migrainosus: Secondary | ICD-10-CM

## 2019-08-10 DIAGNOSIS — F909 Attention-deficit hyperactivity disorder, unspecified type: Secondary | ICD-10-CM

## 2019-08-10 DIAGNOSIS — I82409 Acute embolism and thrombosis of unspecified deep veins of unspecified lower extremity: Secondary | ICD-10-CM

## 2019-08-10 DIAGNOSIS — R55 Syncope and collapse: Secondary | ICD-10-CM

## 2019-08-10 DIAGNOSIS — K219 Gastro-esophageal reflux disease without esophagitis: Secondary | ICD-10-CM

## 2019-08-10 DIAGNOSIS — R519 Generalized headaches: Secondary | ICD-10-CM

## 2019-08-10 DIAGNOSIS — R52 Pain, unspecified: Secondary | ICD-10-CM

## 2019-08-10 DIAGNOSIS — IMO0002 Chiari malformation: Secondary | ICD-10-CM

## 2019-08-10 DIAGNOSIS — J189 Pneumonia, unspecified organism: Secondary | ICD-10-CM

## 2019-08-10 DIAGNOSIS — M797 Fibromyalgia: Secondary | ICD-10-CM

## 2019-08-10 DIAGNOSIS — F329 Major depressive disorder, single episode, unspecified: Secondary | ICD-10-CM

## 2019-08-10 DIAGNOSIS — E663 Overweight: Secondary | ICD-10-CM

## 2019-08-10 DIAGNOSIS — N920 Excessive and frequent menstruation with regular cycle: Secondary | ICD-10-CM

## 2019-08-10 DIAGNOSIS — C801 Malignant (primary) neoplasm, unspecified: Secondary | ICD-10-CM

## 2019-08-10 DIAGNOSIS — M479 Spondylosis, unspecified: Secondary | ICD-10-CM

## 2019-08-10 DIAGNOSIS — R079 Chest pain, unspecified: Secondary | ICD-10-CM

## 2019-08-10 DIAGNOSIS — M2550 Pain in unspecified joint: Secondary | ICD-10-CM

## 2019-08-10 DIAGNOSIS — S0990XA Unspecified injury of head, initial encounter: Secondary | ICD-10-CM

## 2019-08-10 DIAGNOSIS — R002 Palpitations: Secondary | ICD-10-CM

## 2019-08-10 DIAGNOSIS — R0789 Other chest pain: Secondary | ICD-10-CM

## 2019-08-10 DIAGNOSIS — I10 Essential (primary) hypertension: Secondary | ICD-10-CM

## 2019-08-10 DIAGNOSIS — I2699 Other pulmonary embolism without acute cor pulmonale: Secondary | ICD-10-CM

## 2019-08-10 DIAGNOSIS — E079 Disorder of thyroid, unspecified: Secondary | ICD-10-CM

## 2019-08-10 DIAGNOSIS — M549 Dorsalgia, unspecified: Secondary | ICD-10-CM

## 2019-08-10 DIAGNOSIS — F419 Anxiety disorder, unspecified: Secondary | ICD-10-CM

## 2019-08-10 DIAGNOSIS — J45909 Unspecified asthma, uncomplicated: Secondary | ICD-10-CM

## 2019-08-10 DIAGNOSIS — R110 Nausea: Secondary | ICD-10-CM

## 2019-08-10 MED ORDER — HYDROCHLOROTHIAZIDE 25 MG PO TAB
25 mg | ORAL_TABLET | Freq: Every morning | ORAL | 0 refills | 28.00000 days | Status: AC
Start: 2019-08-10 — End: ?

## 2019-08-10 MED ORDER — PRAZOSIN 1 MG PO CAP
2 mg | Freq: Every evening | ORAL | 0 refills | Status: AC
Start: 2019-08-10 — End: ?

## 2019-08-10 NOTE — Assessment & Plan Note
I think she understands the need to maintain an adequate level of hydration in order to prevent further episodes of vasovagal syncope.  Fortunately she has gotten through the recent episode of gastroenteritis without any significant vagal symptoms.

## 2019-08-10 NOTE — Assessment & Plan Note
She has been told that the goal for her blood pressure is somewhere in the range of 115-135 systolic.  Even without spironolactone her pressure has been in that range on hydrochlorothiazide alone.  She understands that she may need to go back on spironolactone at some point in the future, depending on what sort of numbers that she is getting with her home blood pressure monitor.

## 2019-08-10 NOTE — Progress Notes
Telehealth Visit Note    Date of Service: 08/10/2019     Medical assistant/scheduling obtained patient's verbal consent to treat them and their agreement to [name] financial policy and NPP via this telehealth visit during the [name]. This video home visit was conducted via a call between the patient and physician/provider due to COVID-19. We have found that certain health care needs can be provided without an in-person visit.. This service lets [place] provide the care patients' need.  If a prescription is necessary we can send it directly to their pharmacy.  If testing is necessary this can be arranged.        Additional time was spent on reviewing interim medical documentation and coordination of care  Provider Call Started at 1255  Provider Call Ended at [name] is a 48 y.o. female.    History of Present Illness    Tina [name] was on Zoom this afternoon from her home in [place].  She has a vagal hypersensitivity syndrome and chronic hypertension.  She had undergone a cardiac work-up earlier this year and, although she definitely had a positive head up tilt study, we found no evidence of underlying structural heart disease.    2 days ago she developed the sudden onset of intense nausea, vomiting, and diarrhea.  She was evaluated in the [facility] emergency department.  She was not hospitalized.  She just took in clear liquids yesterday and is just beginning to advance her diet a little today.  She is definitely getting better and is able to keep fluids down.    Fortunately, she did not have any exacerbation of her autonomic dysfunction during the illness.    She only took midodrine for a couple of weeks and stopped it due to an exacerbation of peripheral edema.  Even before she developed the GI illness Saturday her blood pressures had come down to the point that she had discontinued spironolactone.  She has continued hydrochlorothiazide 25 mg/day.  She takes prazosin at bedtime for PTSD.    She denies any chest pain, palpitations, or lightheadedness.       Review of Systems   Constitutional: Negative.    HENT: Negative.    Eyes: Negative.    Respiratory: Positive for chest tightness and shortness of breath.    Cardiovascular: Negative.    Gastrointestinal: Negative.    Endocrine: Negative.    Genitourinary: Negative.    Musculoskeletal: Negative.    Skin: Negative.    Allergic/Immunologic: Negative.    Neurological: Negative.    Hematological: Negative.    Psychiatric/Behavioral: Negative.          Objective:         ? acetaminophen (TYLENOL) 500 mg tablet Take 500 mg by mouth every 6 hours as needed for Pain. Max of 4,000 mg of acetaminophen in 24 hours.   ? baclofen (LIORESAL) 20 mg tablet Take 20 mg by mouth as Needed. Indications: muscle spasms caused by a spinal disease   ? cetirizine (ZYRTEC) 10 mg tablet Take one tablet by mouth every morning.   ? EPINEPHrine(+) (EPIPEN) 1 mg/mL injection pen Inject 0.3 mg to area(s) as directed once as needed.   ? hydroCHLOROthiazide (HYDRODIURIL) 25 mg tablet Take one tablet by mouth every morning.   ? lemborexant (DAYVIGO) 10 mg tab Take 10 mg by mouth at bedtime daily.   ? levothyroxine (SYNTHROID) 150 mcg tablet TAKE 1 TABLET BY MOUTH 6 TIMES PER WEEK  TAKE ON EMPTY STOMACH NOT WITH MEDS/FOOD/DRINK   ? midodrine (PROAMATINE) 2.5 mg tablet Take one tablet by mouth twice daily. (Patient taking differently: Take 2.5 mg by mouth twice daily. Indications: ON HOLD PER PATIENT)   ? ondansetron (ZOFRAN ODT) 8 mg rapid dissolve tablet Dissolve 8 mg by mouth every 8 hours as needed for Nausea or Vomiting. Place on tongue to disolve.   ? potassium chloride SR (K-DUR) 10 mEq tablet Take 10 mEq by mouth twice daily. Take with a meal and a full glass of water.   ? prazosin (MINIPRESS) 1 mg capsule Take two capsules by mouth at bedtime daily.   ? spironolactone (ALDACTONE) 50 mg tablet Take 50 mg by mouth daily. Take with food.   Indications: ON HOLD PER PATIENT   ? sumatriptan succinate (IMITREX) 100 mg tablet Take 100 mg by mouth as Needed for Migraine symptoms. Take one tablet by mouth at onset of headache. May repeat after 2 hours if needed. Max of 200 mg in 24 hours.   ? SYMBICORT 160-4.5 mcg/actuation inhalation    ? VENTOLIN HFA 90 mcg/actuation inhaler Take 2 puffs by mouth as Needed.     Vitals:    08/10/19 1248   Weight: 118.4 kg (261 lb)   Height: 1.702 m (5' 7)     Body mass index is 40.88 kg/m?Marland Kitchen     Telehealth Patient Reported Vitals     Row Name 08/10/19 1242                BP:  131/89        BP Source:  Arm, Left Upper        BP Position:  Sitting        BP Method:  Automatic/Digital Reading        Pulse:  72        Weight:  118.4 kg (261 lb)        Height:  1.702 m (5' 7)              Physical Exam    General:  Resting comfortably and in no apparent distress  HEENT:  Extra-ocular movements intact and oropharynx appears clear  Neck:  Neck veins are flat and not distended  Chest:  Chest is normal in appearance  Respiratory:  Normal, unlabored breathing  Extremities:  No lower extremity swelling  Skin:  Intact with no visible rash, discoloration, or wounds  Digits and Nails:  No clubbing  Neurologic:  Oriented to time, place, and person.  No dysarthria.  No facial asymmetry.  Psychiatric:  Mood and affect are normal  Other:  Moves all extremities       Assessment and Plan:    Problem   Vasovagal Syncope    10/2015 - Syncope while working at the kitchen sink.  Probable vasovagal episode.     Hypertension    08/2006 Stress Echo: Normal Ventricular Function With No Regional Wall Motion Abnormalities. Valve Structures Are Unremarkable. Clinical Response: No Symptoms. Stress ECG: No Diagnostic ST Segment Changes. Stress Echo: Was Negative for Ischemia             Hypertension  She has been told that the goal for her blood pressure is somewhere in the range of 115-135 systolic.  Even without spironolactone her pressure has been in that range on hydrochlorothiazide alone.  She understands that she may need to go back on spironolactone at some point in the future, depending on what sort of numbers  that she is getting with her home blood pressure monitor.    Vasovagal syncope  I think she understands the need to maintain an adequate level of hydration in order to prevent further episodes of vasovagal syncope.  Fortunately she has gotten through the recent episode of gastroenteritis without any significant vagal symptoms.

## 2019-08-19 ENCOUNTER — Ambulatory Visit: Admit: 2019-08-19 | Discharge: 2019-08-19 | Payer: Medicaid Other

## 2019-08-19 ENCOUNTER — Encounter: Admit: 2019-08-19 | Discharge: 2019-08-19 | Payer: Medicaid Other

## 2019-08-19 DIAGNOSIS — I82409 Acute embolism and thrombosis of unspecified deep veins of unspecified lower extremity: Secondary | ICD-10-CM

## 2019-08-19 DIAGNOSIS — G935 Compression of brain: Secondary | ICD-10-CM

## 2019-08-19 DIAGNOSIS — R079 Chest pain, unspecified: Secondary | ICD-10-CM

## 2019-08-19 DIAGNOSIS — C801 Malignant (primary) neoplasm, unspecified: Secondary | ICD-10-CM

## 2019-08-19 DIAGNOSIS — F329 Major depressive disorder, single episode, unspecified: Secondary | ICD-10-CM

## 2019-08-19 DIAGNOSIS — M549 Dorsalgia, unspecified: Secondary | ICD-10-CM

## 2019-08-19 DIAGNOSIS — S0990XA Unspecified injury of head, initial encounter: Secondary | ICD-10-CM

## 2019-08-19 DIAGNOSIS — F909 Attention-deficit hyperactivity disorder, unspecified type: Secondary | ICD-10-CM

## 2019-08-19 DIAGNOSIS — M479 Spondylosis, unspecified: Secondary | ICD-10-CM

## 2019-08-19 DIAGNOSIS — J189 Pneumonia, unspecified organism: Secondary | ICD-10-CM

## 2019-08-19 DIAGNOSIS — F419 Anxiety disorder, unspecified: Secondary | ICD-10-CM

## 2019-08-19 DIAGNOSIS — J4520 Mild intermittent asthma, uncomplicated: Secondary | ICD-10-CM

## 2019-08-19 DIAGNOSIS — E663 Overweight: Secondary | ICD-10-CM

## 2019-08-19 DIAGNOSIS — I2699 Other pulmonary embolism without acute cor pulmonale: Secondary | ICD-10-CM

## 2019-08-19 DIAGNOSIS — G43909 Migraine, unspecified, not intractable, without status migrainosus: Secondary | ICD-10-CM

## 2019-08-19 DIAGNOSIS — R519 Generalized headaches: Secondary | ICD-10-CM

## 2019-08-19 DIAGNOSIS — R0789 Other chest pain: Secondary | ICD-10-CM

## 2019-08-19 DIAGNOSIS — M2550 Pain in unspecified joint: Secondary | ICD-10-CM

## 2019-08-19 DIAGNOSIS — N920 Excessive and frequent menstruation with regular cycle: Secondary | ICD-10-CM

## 2019-08-19 DIAGNOSIS — E079 Disorder of thyroid, unspecified: Secondary | ICD-10-CM

## 2019-08-19 DIAGNOSIS — M797 Fibromyalgia: Secondary | ICD-10-CM

## 2019-08-19 DIAGNOSIS — J302 Other seasonal allergic rhinitis: Secondary | ICD-10-CM

## 2019-08-19 DIAGNOSIS — R002 Palpitations: Secondary | ICD-10-CM

## 2019-08-19 DIAGNOSIS — Q0700 Arnold-Chiari syndrome without spina bifida or hydrocephalus: Secondary | ICD-10-CM

## 2019-08-19 DIAGNOSIS — M1990 Unspecified osteoarthritis, unspecified site: Secondary | ICD-10-CM

## 2019-08-19 DIAGNOSIS — R110 Nausea: Secondary | ICD-10-CM

## 2019-08-19 DIAGNOSIS — R011 Cardiac murmur, unspecified: Secondary | ICD-10-CM

## 2019-08-19 DIAGNOSIS — IMO0002 Chiari malformation: Secondary | ICD-10-CM

## 2019-08-19 DIAGNOSIS — J45909 Unspecified asthma, uncomplicated: Secondary | ICD-10-CM

## 2019-08-19 DIAGNOSIS — K219 Gastro-esophageal reflux disease without esophagitis: Secondary | ICD-10-CM

## 2019-08-19 DIAGNOSIS — G971 Other reaction to spinal and lumbar puncture: Secondary | ICD-10-CM

## 2019-08-19 DIAGNOSIS — R52 Pain, unspecified: Secondary | ICD-10-CM

## 2019-08-19 MED ORDER — SYMBICORT 160-4.5 MCG/ACTUATION IN HFAA
2 | Freq: Two times a day (BID) | RESPIRATORY_TRACT | 3 refills | 30.00000 days | Status: DC
Start: 2019-08-19 — End: 2019-08-19

## 2019-08-19 MED ORDER — BUDESONIDE-FORMOTEROL 80-4.5 MCG/ACTUATION IN HFAA
2 | Freq: Two times a day (BID) | RESPIRATORY_TRACT | 11 refills | 30.00000 days | Status: DC
Start: 2019-08-19 — End: 2019-10-15

## 2019-08-26 ENCOUNTER — Encounter: Admit: 2019-08-26 | Discharge: 2019-08-26 | Payer: Medicaid Other

## 2019-09-01 ENCOUNTER — Encounter: Admit: 2019-09-01 | Discharge: 2019-09-01 | Payer: Medicaid Other

## 2019-09-01 NOTE — Progress Notes
Pt stopped by [facility] clinic with bp readings on her phone. pt stated she did not know how to upload to [facility] or fax them. gave pt my email to email them to me this 1 time. Forwarded email to Waneta Martins. w/ patient info. Brett Canales will follow up w/ pt

## 2019-09-01 NOTE — Telephone Encounter
-----   Message from Connye Burkitt, RN sent at 09/01/2019  4:29 PM CDT -----  Regarding: FW: Prescription Question  Contact: [phone]    ----- Message -----  From: Barbie Banner  Sent: 09/01/2019   4:13 PM CDT  To: [facility] Nurse Triage [place]  Subject: Prescription Question                            Tried to snap a photo of my blood pressure from the chart. I dropped off at the [place] office the rest of it that's about a month's worth if you would please call me [phone]

## 2019-09-16 ENCOUNTER — Encounter: Admit: 2019-09-16 | Discharge: 2019-09-16 | Payer: Medicaid Other

## 2019-09-17 ENCOUNTER — Encounter: Admit: 2019-09-17 | Discharge: 2019-09-17 | Payer: Medicaid Other

## 2019-09-17 MED ORDER — SPIRONOLACTONE 25 MG PO TAB
25 mg | ORAL_TABLET | Freq: Every day | ORAL | 0 refills | 90.00000 days | Status: AC
Start: 2019-09-17 — End: ?

## 2019-09-17 NOTE — Telephone Encounter
Patient called stating that she would like primary pharmacy to be through [facility] in [place].

## 2019-09-22 ENCOUNTER — Encounter: Admit: 2019-09-22 | Discharge: 2019-09-22 | Payer: Medicaid Other
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
## 2019-10-15 ENCOUNTER — Encounter: Admit: 2019-10-15 | Discharge: 2019-10-15 | Payer: Medicaid Other

## 2019-10-15 MED ORDER — BUDESONIDE-FORMOTEROL 80-4.5 MCG/ACTUATION IN HFAA
2 | Freq: Two times a day (BID) | RESPIRATORY_TRACT | 0 refills | 30.00000 days | Status: AC
Start: 2019-10-15 — End: ?

## 2019-10-15 MED ORDER — CETIRIZINE 10 MG PO TAB
10 mg | ORAL_TABLET | Freq: Every morning | ORAL | 0 refills | Status: AC
Start: 2019-10-15 — End: ?

## 2019-10-15 MED ORDER — VENTOLIN HFA 90 MCG/ACTUATION IN HFAA
2 | RESPIRATORY_TRACT | 0 refills | Status: AC | PRN
Start: 2019-10-15 — End: ?

## 2019-10-15 NOTE — Telephone Encounter
Patient left voicemail reporting that she no longer uses [facility] and they are not willing to transfer her prescriptions to her new pharmacy, [facility] in [place].    Patient requested RX for Zyrtec and inhaler be sent to [facility] in [place].    Patient last seen 08/19/19 with plan to continue cetirizine, albuterol and Symbicort 80-4.5.    Patient was to follow up in 9 months (around 05/18/20) and   this was never scheduled.    1 month refill approved     Called patient to notify.  Scheduled follow up with Dr.Clark in March.  Pt verbalizes understanding and is appreciative of call.

## 2019-10-19 ENCOUNTER — Encounter: Admit: 2019-10-19 | Discharge: 2019-10-19 | Payer: Medicaid Other

## 2019-10-19 MED ORDER — ALBUTEROL SULFATE 90 MCG/ACTUATION IN HFAA
2 | RESPIRATORY_TRACT | 11 refills | Status: AC | PRN
Start: 2019-10-19 — End: ?

## 2019-10-19 NOTE — Telephone Encounter
Received fax from [employer] stating that Ventolin is not covered. Listing covered alternatives as Albuterol Sulfate HFA, Albuterol Sulfate, ProAir Respiclick, Xopenex HFA and Levalbuterol Tartrate.    Chart Review shows no preference for Ventolin or negative reactions to other Albuterol inhalers.    New E-RX sent for Albuterol HFA.

## 2019-11-02 ENCOUNTER — Encounter: Admit: 2019-11-02 | Discharge: 2019-11-02 | Payer: Medicaid Other

## 2019-11-02 NOTE — Telephone Encounter
Called Tina [name] to confirm she intends to get lab's ordered by Dr. Sunday Corn done prior to appointment on 09/13.    She confirmed that she will be at [facility] on the 09/02 for an appointment with Dr. Samara Deist and will walk over to the lab that day.     I did education the patient on requirements for the 2 hour glucose fasting test.     Patient understood requirements and will reach out with any further questions.

## 2019-11-05 ENCOUNTER — Ambulatory Visit: Admit: 2019-11-05 | Discharge: 2019-11-05 | Payer: Medicaid Other

## 2019-11-05 ENCOUNTER — Encounter: Admit: 2019-11-05 | Discharge: 2019-11-05 | Payer: Medicaid Other

## 2019-11-05 DIAGNOSIS — G935 Compression of brain: Secondary | ICD-10-CM

## 2019-11-05 DIAGNOSIS — M2550 Pain in unspecified joint: Secondary | ICD-10-CM

## 2019-11-05 DIAGNOSIS — R0789 Other chest pain: Secondary | ICD-10-CM

## 2019-11-05 DIAGNOSIS — N920 Excessive and frequent menstruation with regular cycle: Secondary | ICD-10-CM

## 2019-11-05 DIAGNOSIS — F329 Major depressive disorder, single episode, unspecified: Secondary | ICD-10-CM

## 2019-11-05 DIAGNOSIS — M7918 Myalgia, other site: Secondary | ICD-10-CM

## 2019-11-05 DIAGNOSIS — R52 Pain, unspecified: Secondary | ICD-10-CM

## 2019-11-05 DIAGNOSIS — M1990 Unspecified osteoarthritis, unspecified site: Secondary | ICD-10-CM

## 2019-11-05 DIAGNOSIS — G629 Polyneuropathy, unspecified: Secondary | ICD-10-CM

## 2019-11-05 DIAGNOSIS — M797 Fibromyalgia: Secondary | ICD-10-CM

## 2019-11-05 DIAGNOSIS — R110 Nausea: Secondary | ICD-10-CM

## 2019-11-05 DIAGNOSIS — M479 Spondylosis, unspecified: Secondary | ICD-10-CM

## 2019-11-05 DIAGNOSIS — E663 Overweight: Secondary | ICD-10-CM

## 2019-11-05 DIAGNOSIS — M549 Dorsalgia, unspecified: Secondary | ICD-10-CM

## 2019-11-05 DIAGNOSIS — J189 Pneumonia, unspecified organism: Secondary | ICD-10-CM

## 2019-11-05 DIAGNOSIS — G971 Other reaction to spinal and lumbar puncture: Secondary | ICD-10-CM

## 2019-11-05 DIAGNOSIS — R079 Chest pain, unspecified: Secondary | ICD-10-CM

## 2019-11-05 DIAGNOSIS — E079 Disorder of thyroid, unspecified: Secondary | ICD-10-CM

## 2019-11-05 DIAGNOSIS — Q0700 Arnold-Chiari syndrome without spina bifida or hydrocephalus: Secondary | ICD-10-CM

## 2019-11-05 DIAGNOSIS — G43909 Migraine, unspecified, not intractable, without status migrainosus: Secondary | ICD-10-CM

## 2019-11-05 DIAGNOSIS — R011 Cardiac murmur, unspecified: Secondary | ICD-10-CM

## 2019-11-05 DIAGNOSIS — R519 Generalized headaches: Secondary | ICD-10-CM

## 2019-11-05 DIAGNOSIS — R002 Palpitations: Secondary | ICD-10-CM

## 2019-11-05 DIAGNOSIS — J45909 Unspecified asthma, uncomplicated: Secondary | ICD-10-CM

## 2019-11-05 DIAGNOSIS — C801 Malignant (primary) neoplasm, unspecified: Secondary | ICD-10-CM

## 2019-11-05 DIAGNOSIS — F419 Anxiety disorder, unspecified: Secondary | ICD-10-CM

## 2019-11-05 DIAGNOSIS — IMO0002 Chiari malformation: Secondary | ICD-10-CM

## 2019-11-05 DIAGNOSIS — F909 Attention-deficit hyperactivity disorder, unspecified type: Secondary | ICD-10-CM

## 2019-11-05 DIAGNOSIS — S0990XA Unspecified injury of head, initial encounter: Secondary | ICD-10-CM

## 2019-11-05 DIAGNOSIS — I82409 Acute embolism and thrombosis of unspecified deep veins of unspecified lower extremity: Secondary | ICD-10-CM

## 2019-11-05 DIAGNOSIS — I2699 Other pulmonary embolism without acute cor pulmonale: Secondary | ICD-10-CM

## 2019-11-05 DIAGNOSIS — K219 Gastro-esophageal reflux disease without esophagitis: Secondary | ICD-10-CM

## 2019-11-05 LAB — RHEUMATOID FACTOR (RF): Lab: <10 [IU]/mL (ref <25)

## 2019-11-05 LAB — TSH WITH FREE T4 REFLEX: Lab: 3.3 uU/mL (ref 0.35–5.00)

## 2019-11-05 LAB — SED RATE: Lab: 15 mm/h (ref 0–30)

## 2019-11-05 MED ORDER — TRIAMCINOLONE ACETONIDE 40 MG/ML IJ SUSP
40 mg | Freq: Once | INTRAMUSCULAR | 0 refills | Status: CP | PRN
Start: 2019-11-05 — End: ?
  Administered 2019-11-05: 16:00:00 40 mg via INTRAMUSCULAR

## 2019-11-05 MED ORDER — BUPIVACAINE (PF) 0.25 % (2.5 MG/ML) IJ SOLN
2 mL | Freq: Once | INTRAMUSCULAR | 0 refills | Status: CP | PRN
Start: 2019-11-05 — End: ?
  Administered 2019-11-05: 16:00:00 2 mL via INTRAMUSCULAR

## 2019-11-05 NOTE — Procedures
Attending Surgeon: Gabriel Rung, MD    Anesthesia: Local    Pre-Procedure Diagnosis:   1. Myalgia, other site        Post-Procedure Diagnosis:   1. Myalgia, other site        Trigger Point  Locations: R cervical/thoracic/lumbar paraspinal, R cervical, R occipital ridge and R thoracic  Consent:   Consent obtained: verbal and written  Consent given by: patient  Alternatives discussed: alternative treatment     Universal Protocol:  Relevant documents: relevant documents present and verified  Test results: test results available and properly labeled  Imaging studies: imaging studies available  Required items: required blood products, implants, devices, and special equipment available  Site marked: the operative site was marked  Patient identity confirmed: Patient identify confirmed verbally with patient.        Time out: Immediately prior to procedure a time out was called to verify the correct patient, procedure, equipment, support staff and site/side marked as required      Procedures Details:   Needle size: 27 G  Number of muscles: 3 or more  Medications administered: 2 mL bupivacaine PF 0.25 %; 40 mg triamcinolone acetonide 40 mg/mL  Patient tolerance: Patient tolerated the procedure well with no immediate complications. Pressure was applied, and hemostasis was accomplished.        Estimated blood loss: none or minimal  Specimens: none  Patient tolerated the procedure well with no immediate complications. Pressure was applied, and hemostasis was accomplished.

## 2019-11-06 ENCOUNTER — Encounter: Admit: 2019-11-06 | Discharge: 2019-11-06 | Payer: Medicaid Other

## 2019-11-06 DIAGNOSIS — M7918 Myalgia, other site: Secondary | ICD-10-CM

## 2019-11-06 LAB — IMMUNOFIXATION, SERUM (IFES)

## 2019-11-06 LAB — ELECTROPHORESIS-SERUM PROTEIN
Lab: 11 % (ref 5–15)
Lab: 4.5 % (ref 2–6)
Lab: 57 % (ref 48–68)
Lab: 6.7 g/dL (ref 6.0–8.0)

## 2019-11-06 LAB — ANTI-NUCLEAR ANTIBODY(ANA): Lab: <80 {titer} (ref <80)

## 2019-11-11 NOTE — Patient Instructions
Way to go! We really appreciate that you arrived 15 minutes early for your appointment today. We know your time is important too! Patients arriving 15 minutes prior to appointment helps us in our attempt to stay on time throughout the day.

## 2019-11-12 ENCOUNTER — Encounter: Admit: 2019-11-12 | Discharge: 2019-11-12 | Payer: Medicaid Other

## 2019-11-12 ENCOUNTER — Ambulatory Visit: Admit: 2019-11-12 | Discharge: 2019-11-13 | Payer: Medicaid Other

## 2019-11-12 DIAGNOSIS — Q0700 Arnold-Chiari syndrome without spina bifida or hydrocephalus: Secondary | ICD-10-CM

## 2019-11-12 DIAGNOSIS — C801 Malignant (primary) neoplasm, unspecified: Secondary | ICD-10-CM

## 2019-11-12 DIAGNOSIS — M2550 Pain in unspecified joint: Secondary | ICD-10-CM

## 2019-11-12 DIAGNOSIS — R519 Generalized headaches: Secondary | ICD-10-CM

## 2019-11-12 DIAGNOSIS — R0789 Other chest pain: Secondary | ICD-10-CM

## 2019-11-12 DIAGNOSIS — R011 Cardiac murmur, unspecified: Secondary | ICD-10-CM

## 2019-11-12 DIAGNOSIS — F329 Major depressive disorder, single episode, unspecified: Secondary | ICD-10-CM

## 2019-11-12 DIAGNOSIS — R208 Other disturbances of skin sensation: Secondary | ICD-10-CM

## 2019-11-12 DIAGNOSIS — I2699 Other pulmonary embolism without acute cor pulmonale: Secondary | ICD-10-CM

## 2019-11-12 DIAGNOSIS — N920 Excessive and frequent menstruation with regular cycle: Secondary | ICD-10-CM

## 2019-11-12 DIAGNOSIS — M797 Fibromyalgia: Secondary | ICD-10-CM

## 2019-11-12 DIAGNOSIS — J45909 Unspecified asthma, uncomplicated: Secondary | ICD-10-CM

## 2019-11-12 DIAGNOSIS — E663 Overweight: Secondary | ICD-10-CM

## 2019-11-12 DIAGNOSIS — M1990 Unspecified osteoarthritis, unspecified site: Secondary | ICD-10-CM

## 2019-11-12 DIAGNOSIS — R079 Chest pain, unspecified: Secondary | ICD-10-CM

## 2019-11-12 DIAGNOSIS — S0990XA Unspecified injury of head, initial encounter: Secondary | ICD-10-CM

## 2019-11-12 DIAGNOSIS — G43909 Migraine, unspecified, not intractable, without status migrainosus: Secondary | ICD-10-CM

## 2019-11-12 DIAGNOSIS — G935 Compression of brain: Secondary | ICD-10-CM

## 2019-11-12 DIAGNOSIS — R002 Palpitations: Secondary | ICD-10-CM

## 2019-11-12 DIAGNOSIS — R110 Nausea: Secondary | ICD-10-CM

## 2019-11-12 DIAGNOSIS — M549 Dorsalgia, unspecified: Secondary | ICD-10-CM

## 2019-11-12 DIAGNOSIS — IMO0002 Chiari malformation: Secondary | ICD-10-CM

## 2019-11-12 DIAGNOSIS — J189 Pneumonia, unspecified organism: Secondary | ICD-10-CM

## 2019-11-12 DIAGNOSIS — M479 Spondylosis, unspecified: Secondary | ICD-10-CM

## 2019-11-12 DIAGNOSIS — G971 Other reaction to spinal and lumbar puncture: Secondary | ICD-10-CM

## 2019-11-12 DIAGNOSIS — F909 Attention-deficit hyperactivity disorder, unspecified type: Secondary | ICD-10-CM

## 2019-11-12 DIAGNOSIS — K219 Gastro-esophageal reflux disease without esophagitis: Secondary | ICD-10-CM

## 2019-11-12 DIAGNOSIS — I82409 Acute embolism and thrombosis of unspecified deep veins of unspecified lower extremity: Secondary | ICD-10-CM

## 2019-11-12 DIAGNOSIS — F419 Anxiety disorder, unspecified: Secondary | ICD-10-CM

## 2019-11-12 DIAGNOSIS — E079 Disorder of thyroid, unspecified: Secondary | ICD-10-CM

## 2019-11-12 DIAGNOSIS — R52 Pain, unspecified: Secondary | ICD-10-CM
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
## 2019-11-16 ENCOUNTER — Encounter: Admit: 2019-11-16 | Discharge: 2019-11-16 | Payer: Medicaid Other

## 2019-11-20 ENCOUNTER — Encounter: Admit: 2019-11-20 | Discharge: 2019-11-20 | Payer: Medicaid Other

## 2019-11-20 NOTE — Telephone Encounter
-----   Message from Marisue Brooklyn sent at 11/13/2019  9:54 AM CDT -----  Regarding: Skin Bx  Dr.Southwell ordered a skin bx on this patient. Can you please schedule when you have a moment?    Thank you!  LL

## 2019-11-20 NOTE — Telephone Encounter
[facility] Biopsy Nurse Intake Form  Patient was called and biopsy discussed in detail.   Questions answered to the best of my ability.   Pt stated no questions or concerns at this time.     [facility] Date/Time 10.6.21 at 1330   Ordering doctor Southwell   EMG date  4.13.21   EMG results  normal   Prior Auth received       Allergies  NO Yes Reaction    Rubbing Alcohol x     Iodine/ Shellfish x     Lidocaine x     Epinephrine x     Tape (adhesive sensitivity)  x rash   Latex x     Other:         Anticoag/platelet/ blood thinners:               no Immuno   Suppressants  no   Aspirin  Azathioprine (Imuran)    Aggrenox  Copaxone    Coumadin ( Warfarin )  Cyclosporine    Fish Oil  Cytoxine    CoQ 10  Gengraf    Ibuprofen  Prograf    Plavix  Glatiramer Acetate    Other :  Methotrexate      Mycophenolate ( cellcept)      Neoral      Rapamune      Meds ending in Umab or domide                  ? 2 specimens will be obtained. One will be taken a few inches above your ankle and one a few inches below your hip. Each specimen is 3mm in diameter.  ? Dr. Meredeth Ide will start by cleansing the sites with topical iodine and rubbing alcohol. Each site is then numbed using the lidocaine/epinephrine.   ? After the local anesthetic agents take effect, Dr. Meredeth Ide will remove each specimen using a sterile 3mm punch biopsy tool. After both specimens are obtained; Dr. Meredeth Ide will cover each site with antibiotic ointment and a bandage.  ? The specimens will be submitted to processing. Results take a few weeks for processing. You will be notified of the results by your referring physician when they are complete.   ? Additional appointment instructions:  ? Please remember to eat meals as it is not necessary to fast for this procedure  ? It is not necessary to have a driver   ? If you begin any new medications prior to your scheduled appt date, please contact our office with this information as some medications may not be safe to take before this procedure; if you are currently taking medications that thin your blood or suppress your immune system please contact our office to discuss this. We may not be able to move forward with this procedure.   ? Please arrive 2-5 minutes prior to your scheduled appointment time  ? Face coverings are mandatory at this time    ? Please bring the following items to your biopsy appointment;  ? Valid Photo ID  ? Insurance Card  ? Current List of Medications (Including over the counter medications)

## 2019-11-23 ENCOUNTER — Encounter: Admit: 2019-11-23 | Discharge: 2019-11-23 | Payer: Medicaid Other

## 2019-11-23 MED ORDER — BUDESONIDE-FORMOTEROL 80-4.5 MCG/ACTUATION IN HFAA
Freq: Two times a day (BID) | RESPIRATORY_TRACT | 5 refills | 30.00000 days | Status: AC
Start: 2019-11-23 — End: ?

## 2019-11-23 NOTE — Telephone Encounter
Refill Request received.    Patient last seen 08/19/19 with plan to Decrease to Symbicort 80-4.5 mcg 2 puffs BID - decrease to PRN once through summer months    Follow up appt scheduled for 05/11/20    1 month and 5 refills e-scribed per protocol.

## 2019-11-24 ENCOUNTER — Encounter: Admit: 2019-11-24 | Discharge: 2019-11-24 | Payer: Medicaid Other

## 2019-11-24 NOTE — Progress Notes
No PA required for CPT codes 09811 and 11105 per [facility] website  Provider Pre-Auth Tool  [email or web address]  No reference # provided.

## 2019-12-09 ENCOUNTER — Encounter: Admit: 2019-12-09 | Discharge: 2019-12-09 | Payer: Medicaid Other

## 2019-12-09 ENCOUNTER — Ambulatory Visit: Admit: 2019-12-09 | Discharge: 2019-12-09 | Payer: Medicaid Other

## 2019-12-09 DIAGNOSIS — R208 Other disturbances of skin sensation: Secondary | ICD-10-CM
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
## 2019-12-29 ENCOUNTER — Encounter: Admit: 2019-12-29 | Discharge: 2019-12-29 | Payer: Medicaid Other

## 2019-12-30 ENCOUNTER — Encounter: Admit: 2019-12-30 | Discharge: 2019-12-30 | Payer: Medicaid Other
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
## 2019-12-31 ENCOUNTER — Encounter: Admit: 2019-12-31 | Discharge: 2019-12-31 | Payer: Medicaid Other

## 2019-12-31 NOTE — Telephone Encounter
Tina [name] stated she had a message on her My chart to call the office re: her SKB.   Magalie notified of normal skin biopsy results and advised to contact Dr.Southwell with any further questions or concerns

## 2020-01-01 ENCOUNTER — Encounter: Admit: 2020-01-01 | Discharge: 2020-01-01 | Payer: Medicaid Other

## 2020-01-08 ENCOUNTER — Ambulatory Visit: Admit: 2020-01-08 | Discharge: 2020-01-09 | Payer: Medicaid Other

## 2020-01-08 ENCOUNTER — Encounter: Admit: 2020-01-08 | Discharge: 2020-01-08 | Payer: Medicaid Other

## 2020-01-08 DIAGNOSIS — E890 Postprocedural hypothyroidism: Secondary | ICD-10-CM

## 2020-01-08 DIAGNOSIS — G971 Other reaction to spinal and lumbar puncture: Secondary | ICD-10-CM

## 2020-01-08 DIAGNOSIS — R635 Abnormal weight gain: Secondary | ICD-10-CM

## 2020-01-08 DIAGNOSIS — S0990XA Unspecified injury of head, initial encounter: Secondary | ICD-10-CM

## 2020-01-08 DIAGNOSIS — R52 Pain, unspecified: Secondary | ICD-10-CM

## 2020-01-08 DIAGNOSIS — G935 Compression of brain: Secondary | ICD-10-CM

## 2020-01-08 DIAGNOSIS — M2550 Pain in unspecified joint: Secondary | ICD-10-CM

## 2020-01-08 DIAGNOSIS — R002 Palpitations: Secondary | ICD-10-CM

## 2020-01-08 DIAGNOSIS — Q0700 Arnold-Chiari syndrome without spina bifida or hydrocephalus: Secondary | ICD-10-CM

## 2020-01-08 DIAGNOSIS — R110 Nausea: Secondary | ICD-10-CM

## 2020-01-08 DIAGNOSIS — J45909 Unspecified asthma, uncomplicated: Secondary | ICD-10-CM

## 2020-01-08 DIAGNOSIS — E663 Overweight: Secondary | ICD-10-CM

## 2020-01-08 DIAGNOSIS — M1990 Unspecified osteoarthritis, unspecified site: Secondary | ICD-10-CM

## 2020-01-08 DIAGNOSIS — R0789 Other chest pain: Secondary | ICD-10-CM

## 2020-01-08 DIAGNOSIS — E039 Hypothyroidism, unspecified: Secondary | ICD-10-CM

## 2020-01-08 DIAGNOSIS — R011 Cardiac murmur, unspecified: Secondary | ICD-10-CM

## 2020-01-08 DIAGNOSIS — F419 Anxiety disorder, unspecified: Secondary | ICD-10-CM

## 2020-01-08 DIAGNOSIS — IMO0002 Chiari malformation: Secondary | ICD-10-CM

## 2020-01-08 DIAGNOSIS — R079 Chest pain, unspecified: Secondary | ICD-10-CM

## 2020-01-08 DIAGNOSIS — C801 Malignant (primary) neoplasm, unspecified: Secondary | ICD-10-CM

## 2020-01-08 DIAGNOSIS — M797 Fibromyalgia: Secondary | ICD-10-CM

## 2020-01-08 DIAGNOSIS — E079 Disorder of thyroid, unspecified: Secondary | ICD-10-CM

## 2020-01-08 DIAGNOSIS — J189 Pneumonia, unspecified organism: Secondary | ICD-10-CM

## 2020-01-08 DIAGNOSIS — F32A Depression: Secondary | ICD-10-CM

## 2020-01-08 DIAGNOSIS — M479 Spondylosis, unspecified: Secondary | ICD-10-CM

## 2020-01-08 DIAGNOSIS — F909 Attention-deficit hyperactivity disorder, unspecified type: Secondary | ICD-10-CM

## 2020-01-08 DIAGNOSIS — M549 Dorsalgia, unspecified: Secondary | ICD-10-CM

## 2020-01-08 DIAGNOSIS — G43909 Migraine, unspecified, not intractable, without status migrainosus: Secondary | ICD-10-CM

## 2020-01-08 DIAGNOSIS — K219 Gastro-esophageal reflux disease without esophagitis: Secondary | ICD-10-CM

## 2020-01-08 DIAGNOSIS — I2699 Other pulmonary embolism without acute cor pulmonale: Secondary | ICD-10-CM

## 2020-01-08 DIAGNOSIS — I82409 Acute embolism and thrombosis of unspecified deep veins of unspecified lower extremity: Secondary | ICD-10-CM

## 2020-01-08 DIAGNOSIS — R519 Generalized headaches: Secondary | ICD-10-CM

## 2020-01-08 DIAGNOSIS — N920 Excessive and frequent menstruation with regular cycle: Secondary | ICD-10-CM

## 2020-01-08 MED ORDER — LEVOTHYROXINE 150 MCG PO TAB
ORAL_TABLET | ORAL | 1 refills | 30.00000 days | Status: AC
Start: 2020-01-08 — End: ?

## 2020-01-08 NOTE — Patient Instructions
It was great to see you today.      Our plan:  - levothyroxine six days a week, 1/2 tablet one day a week  - labs in 4-6 weeks  - get 2 late night salivary cortisol tests at local lab  - referred to weight management and bariatric surgery    Please make an appointment to come back to clinic in 6 months. I encourage you to do this as soon as possible.     How to get an Appointment:  ? Call [phone], Option 1 for scheduling.  Our office hours are 7am - 4:30pm Monday through Friday.    My nurse is April. You can reach her at [phone]    You can also reach me by sending a secure email message through MyChart email that links directly to your chart.    How to get your test results:  ? We will contact you by MyChart or phone with any test results when they are available.   ? If you are expecting results and have not heard from [place] within 2 weeks of your testing, please send a MyChart message or call the office.    How to get a Medication Refill:  ? Please use the MyChart Refill request or contact your pharmacy directly to request medication refills.  ? Please allow at least 72 hours for refill requests      Your time is important and if you had to wait at all today, I sincerely apologize. My goal is to run exactly on time; however, on occasion, I get behind in clinic due to unexpected patient issues.  Your satisfaction with the care you received today is of utmost importance.  Please contact me directly via MyChart if there are any issues.    Take care,     Glyn Ade, MD

## 2020-01-08 NOTE — Progress Notes
Attempted to reach pt for TH visit. Received VM. Left VM. Will try back.
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
## 2020-01-11 ENCOUNTER — Encounter: Admit: 2020-01-11 | Discharge: 2020-01-11 | Payer: Medicaid Other

## 2020-01-12 ENCOUNTER — Encounter: Admit: 2020-01-12 | Discharge: 2020-01-12 | Payer: Medicaid Other

## 2020-01-12 NOTE — Telephone Encounter
SPOKE WITH PATIENT WENT OVER PT RESPONSIBILITY  ZERO    Date verified: 01/12/20  Insurance Plan:[employer] BETTER [employer]  ID #  [id number] and Group #: [id number]  Verified by (phone # if applicable):[phone] KARA  Reference #:[id number]  In Network for Facility: YES  Effective Date:03/05/17  Deductible NO  Amount remaining:0.00  Coinsurance %:100  OOP Max NO  Amount remaining:0.00  Copay (PCP):0.00  Copay (Specialist):0.00  GMW/NUUVO:[id number]  [id number]  Name of drug/procedure: LAPOSCOPIC BYPASS  GASTRIC SLEEVE  Auth Req Y/N:  YES  Pre-D Req Y/N:  NO  Contact source for Auth/Referral:[employer] BETTER HEALTH  Call reference # / web portal information: FAX [phone]     MEDICALLY NECESSARY

## 2020-01-21 ENCOUNTER — Encounter: Admit: 2020-01-21 | Discharge: 2020-01-21 | Payer: Medicaid Other

## 2020-02-03 ENCOUNTER — Encounter: Admit: 2020-02-03 | Discharge: 2020-02-03 | Payer: Medicaid Other

## 2020-02-03 DIAGNOSIS — E890 Postprocedural hypothyroidism: Secondary | ICD-10-CM

## 2020-02-03 DIAGNOSIS — R635 Abnormal weight gain: Secondary | ICD-10-CM

## 2020-02-03 LAB — THYROID STIMULATING HORMONE-TSH: Lab: 1.4

## 2020-02-04 ENCOUNTER — Ambulatory Visit: Admit: 2020-02-04 | Discharge: 2020-02-04 | Payer: Medicaid Other

## 2020-02-04 ENCOUNTER — Encounter: Admit: 2020-02-04 | Discharge: 2020-02-04 | Payer: Medicaid Other

## 2020-02-04 DIAGNOSIS — G971 Other reaction to spinal and lumbar puncture: Secondary | ICD-10-CM

## 2020-02-04 DIAGNOSIS — G43909 Migraine, unspecified, not intractable, without status migrainosus: Secondary | ICD-10-CM

## 2020-02-04 DIAGNOSIS — I82409 Acute embolism and thrombosis of unspecified deep veins of unspecified lower extremity: Secondary | ICD-10-CM

## 2020-02-04 DIAGNOSIS — M797 Fibromyalgia: Secondary | ICD-10-CM

## 2020-02-04 DIAGNOSIS — F419 Anxiety disorder, unspecified: Secondary | ICD-10-CM

## 2020-02-04 DIAGNOSIS — R002 Palpitations: Secondary | ICD-10-CM

## 2020-02-04 DIAGNOSIS — E079 Disorder of thyroid, unspecified: Secondary | ICD-10-CM

## 2020-02-04 DIAGNOSIS — J45909 Unspecified asthma, uncomplicated: Secondary | ICD-10-CM

## 2020-02-04 DIAGNOSIS — E663 Overweight: Secondary | ICD-10-CM

## 2020-02-04 DIAGNOSIS — R52 Pain, unspecified: Secondary | ICD-10-CM

## 2020-02-04 DIAGNOSIS — R079 Chest pain, unspecified: Secondary | ICD-10-CM

## 2020-02-04 DIAGNOSIS — M7918 Myalgia, other site: Secondary | ICD-10-CM

## 2020-02-04 DIAGNOSIS — M549 Dorsalgia, unspecified: Secondary | ICD-10-CM

## 2020-02-04 DIAGNOSIS — R519 Generalized headaches: Secondary | ICD-10-CM

## 2020-02-04 DIAGNOSIS — N920 Excessive and frequent menstruation with regular cycle: Secondary | ICD-10-CM

## 2020-02-04 DIAGNOSIS — M2550 Pain in unspecified joint: Secondary | ICD-10-CM

## 2020-02-04 DIAGNOSIS — R011 Cardiac murmur, unspecified: Secondary | ICD-10-CM

## 2020-02-04 DIAGNOSIS — IMO0002 Chiari malformation: Secondary | ICD-10-CM

## 2020-02-04 DIAGNOSIS — Q0700 Arnold-Chiari syndrome without spina bifida or hydrocephalus: Secondary | ICD-10-CM

## 2020-02-04 DIAGNOSIS — S0990XA Unspecified injury of head, initial encounter: Secondary | ICD-10-CM

## 2020-02-04 DIAGNOSIS — F909 Attention-deficit hyperactivity disorder, unspecified type: Secondary | ICD-10-CM

## 2020-02-04 DIAGNOSIS — J189 Pneumonia, unspecified organism: Secondary | ICD-10-CM

## 2020-02-04 DIAGNOSIS — K219 Gastro-esophageal reflux disease without esophagitis: Secondary | ICD-10-CM

## 2020-02-04 DIAGNOSIS — I2699 Other pulmonary embolism without acute cor pulmonale: Secondary | ICD-10-CM

## 2020-02-04 DIAGNOSIS — M1990 Unspecified osteoarthritis, unspecified site: Secondary | ICD-10-CM

## 2020-02-04 DIAGNOSIS — F32A Depression: Secondary | ICD-10-CM

## 2020-02-04 DIAGNOSIS — R110 Nausea: Secondary | ICD-10-CM

## 2020-02-04 DIAGNOSIS — G935 Compression of brain: Secondary | ICD-10-CM

## 2020-02-04 DIAGNOSIS — C801 Malignant (primary) neoplasm, unspecified: Secondary | ICD-10-CM

## 2020-02-04 DIAGNOSIS — M479 Spondylosis, unspecified: Secondary | ICD-10-CM

## 2020-02-04 DIAGNOSIS — R0789 Other chest pain: Secondary | ICD-10-CM

## 2020-02-04 NOTE — Patient Instructions
It was nice to see you today. Thank you for choosing to visit our clinic.      Your time is important and if you had to wait today, we do apologize. Our goal is to run exactly on time; however, on occasion, we get behind in clinic due to unexpected patient issues. Thank you for your patience.    General Instructions:   How to reach me: Please send a MyChart message to the [facility] or leave a voicemail for my nurse, Mac[place]i, at [phone].   How to get a medication refill: Please use the MyChart Refill request or contact your pharmacy directly to request medication refills. Please allow 72 business hours for request to be completed.   How to receive your test results: If you have signed up for MyChart, you will receive your test results and messages from me this way. Otherwise, you will get a phone call or letter. If you are expecting results and have not heard from my office within 2 weeks of your testing, please send a MyChart message or call my office.   Scheduling: Our scheduling phone number is [phone].   Appointment Reminders on your cell phone: Make sure we have your cell phone number, and Text [place] to [phone].   Support for many chronic illnesses is available through [facility]: [facility] or [phone].   For questions on nights, weekends or holidays, call the Operator at [phone], and ask for the doctor on call for Anesthesia Pain Management.      Again, thank you for coming in today.    ________________________________________________________________

## 2020-02-04 NOTE — Progress Notes
[facility] CLINIC NOTE       SUBJECTIVE: Tina [name] presents for care regarding back and lower extremity pain.  Pain is in the bilateral lumbar region describes as intermittently sharp and dull.  Walking standing and bending exacerbates her pain.  Pain is improved with rest.  Overall her pain control is much improved and she wishes to defer trigger point injections.  She is currently only taking ibuprofen 200 mg 3 times per day.  She has a left knee surgery pending and subsequently gastric bypass surgery.         Review of Systems    Current Outpatient Medications:   ?  albuterol sulfate (PROAIR HFA) 90 mcg/actuation HFA aerosol inhaler, Inhale two puffs by mouth into the lungs every 6 hours as needed for Wheezing or Shortness of Breath. Shake well before use., Disp: 8.5 g, Rfl: 11  ?  budesonide/formoterol (SYMBICORT HFA) 80-4.5 mcg/actuation inhalation, Inhale 2 puffs by mouth twice daily, Disp: 11 g, Rfl: 5  ?  EPINEPHrine(+) (EPIPEN) 1 mg/mL injection pen, Inject 0.3 mg to area(s) as directed once as needed., Disp: , Rfl:   ?  hydroCHLOROthiazide (HYDRODIURIL) 25 mg tablet, Take one tablet by mouth every morning., Disp: 90 tablet, Rfl:   ?  levothyroxine (SYNTHROID) 150 mcg tablet, Take six days a week and (half a tablet) one day a week. Take 30 min before other food,drink,meds, Disp: 90 tablet, Rfl: 1  ?  meclizine (ANTIVERT) 12.5 mg tablet, Take 12.5 mg by mouth three times daily., Disp: , Rfl:   ?  ondansetron (ZOFRAN ODT) 8 mg rapid dissolve tablet, Dissolve 8 mg by mouth every 8 hours as needed for Nausea or Vomiting. Place on tongue to disolve., Disp: , Rfl:   ?  potassium chloride SR (K-DUR) 10 mEq tablet, Take 10 mEq by mouth twice daily. Take with a meal and a full glass of water., Disp: , Rfl:   ?  prazosin (MINIPRESS) 2 mg capsule, Take 2 mg by mouth as Needed., Disp: , Rfl:   Allergies   Allergen Reactions   ? Aspirin ANAPHYLAXIS   ? Ibuprofen ANAPHYLAXIS   ? Pcn [Penicillins] ANAPHYLAXIS   ? Reglan [Metoclopramide] SEE COMMENTS     Severe leg shakiness   ? Adhesive Tape (Rosins) RASH   ? Sulfa (Sulfonamide Antibiotics) NAUSEA AND VOMITING   ? Omeprazole NAUSEA AND VOMITING   ? Prednisone NAUSEA AND VOMITING     Physical Exam  Vitals:    02/04/20 1109   BP: (!) 171/103   Pulse: 89   SpO2: 95%   Weight: 123.8 kg (273 lb)   Height: 170.2 cm (67.01)     Oswestry Total Score:: 32     Body mass index is 42.75 kg/m?Marland Kitchen  General: Alert and oriented, very pleasant female.   HEENT showed extraocular muscles were intact and no other abnormalities.  Unlabored breathing.  Regular rate and rhythm on CV exam.   5/5 strength in bilateral upper and lower extremities.    Sensation is intact to light touch and equal in the upper and lower extremities.  The lumbar region is non tender       IMPRESSION:  1. Myalgia, other site          PLAN: Will have Ms. Mangal continue with home exercise plan for core strengthening which was prescribed by physical therapy and she is going to follow-up with me on an as-needed basis since her pain is well controlled at this  time.

## 2020-02-11 ENCOUNTER — Encounter: Admit: 2020-02-11 | Discharge: 2020-02-11 | Payer: Medicaid Other

## 2020-02-11 ENCOUNTER — Ambulatory Visit: Admit: 2020-02-11 | Discharge: 2020-02-11 | Payer: Medicaid Other

## 2020-02-11 DIAGNOSIS — M549 Dorsalgia, unspecified: Secondary | ICD-10-CM

## 2020-02-11 DIAGNOSIS — R011 Cardiac murmur, unspecified: Secondary | ICD-10-CM

## 2020-02-11 DIAGNOSIS — J45909 Unspecified asthma, uncomplicated: Secondary | ICD-10-CM

## 2020-02-11 DIAGNOSIS — M2550 Pain in unspecified joint: Secondary | ICD-10-CM

## 2020-02-11 DIAGNOSIS — W19XXXS Unspecified fall, sequela: Secondary | ICD-10-CM

## 2020-02-11 DIAGNOSIS — Z20822 Contact with and (suspected) exposure to covid-19: Secondary | ICD-10-CM

## 2020-02-11 DIAGNOSIS — M1990 Unspecified osteoarthritis, unspecified site: Secondary | ICD-10-CM

## 2020-02-11 DIAGNOSIS — IMO0002 Chiari malformation: Secondary | ICD-10-CM

## 2020-02-11 DIAGNOSIS — E079 Disorder of thyroid, unspecified: Secondary | ICD-10-CM

## 2020-02-11 DIAGNOSIS — F32A Depression: Secondary | ICD-10-CM

## 2020-02-11 DIAGNOSIS — G935 Compression of brain: Secondary | ICD-10-CM

## 2020-02-11 DIAGNOSIS — K219 Gastro-esophageal reflux disease without esophagitis: Secondary | ICD-10-CM

## 2020-02-11 DIAGNOSIS — R200 Anesthesia of skin: Secondary | ICD-10-CM

## 2020-02-11 DIAGNOSIS — M797 Fibromyalgia: Secondary | ICD-10-CM

## 2020-02-11 DIAGNOSIS — R110 Nausea: Secondary | ICD-10-CM

## 2020-02-11 DIAGNOSIS — M479 Spondylosis, unspecified: Secondary | ICD-10-CM

## 2020-02-11 DIAGNOSIS — F419 Anxiety disorder, unspecified: Secondary | ICD-10-CM

## 2020-02-11 DIAGNOSIS — J189 Pneumonia, unspecified organism: Secondary | ICD-10-CM

## 2020-02-11 DIAGNOSIS — N920 Excessive and frequent menstruation with regular cycle: Secondary | ICD-10-CM

## 2020-02-11 DIAGNOSIS — R002 Palpitations: Secondary | ICD-10-CM

## 2020-02-11 DIAGNOSIS — R0789 Other chest pain: Secondary | ICD-10-CM

## 2020-02-11 DIAGNOSIS — E663 Overweight: Secondary | ICD-10-CM

## 2020-02-11 DIAGNOSIS — I2699 Other pulmonary embolism without acute cor pulmonale: Secondary | ICD-10-CM

## 2020-02-11 DIAGNOSIS — R52 Pain, unspecified: Secondary | ICD-10-CM

## 2020-02-11 DIAGNOSIS — C801 Malignant (primary) neoplasm, unspecified: Secondary | ICD-10-CM

## 2020-02-11 DIAGNOSIS — G43909 Migraine, unspecified, not intractable, without status migrainosus: Secondary | ICD-10-CM

## 2020-02-11 DIAGNOSIS — S0990XA Unspecified injury of head, initial encounter: Secondary | ICD-10-CM

## 2020-02-11 DIAGNOSIS — Q0700 Arnold-Chiari syndrome without spina bifida or hydrocephalus: Secondary | ICD-10-CM

## 2020-02-11 DIAGNOSIS — R519 Generalized headaches: Secondary | ICD-10-CM

## 2020-02-11 DIAGNOSIS — I82409 Acute embolism and thrombosis of unspecified deep veins of unspecified lower extremity: Secondary | ICD-10-CM

## 2020-02-11 DIAGNOSIS — R079 Chest pain, unspecified: Secondary | ICD-10-CM

## 2020-02-11 DIAGNOSIS — F909 Attention-deficit hyperactivity disorder, unspecified type: Secondary | ICD-10-CM

## 2020-02-11 DIAGNOSIS — G971 Other reaction to spinal and lumbar puncture: Secondary | ICD-10-CM

## 2020-02-11 LAB — COVID-19 (SARS-COV-2) PCR

## 2020-02-11 MED ORDER — DULOXETINE 30 MG PO CPDR
30 mg | ORAL_CAPSULE | Freq: Every day | ORAL | 5 refills | 60.00000 days | Status: AC
Start: 2020-02-11 — End: ?

## 2020-02-11 NOTE — Telephone Encounter
Contacted patient to provide scheduling number for MRI. Got patient signature for Tina [name] genetic labs. Requested that she send Mychart message to Dr. Alric Ran explaining family history.

## 2020-02-11 NOTE — Patient Instructions
Dr. Southwell and his staff appreciate the opportunity to care for you today. A few items to assist you with what comes next:     *If lab work or imaging was ordered, I will be in contact with you only if the results are abnormal enough that Dr. Southwell requests for your to be notified. If needed, adjustments to the plan will be made.  You are very welcome to contact me to discuss results sooner if you'd prefer.     *All testing and lab work be done here at [facility] unless insurance requires an alternative.  If any part of your care such as a referral doesn't seem to be getting scheduled within a week, please contact our office so we can assist you.     *Dr. Southwell has asked that you be scheduled for a follow up appointment in a specific amount of time to go over all results with him.  If all testing has not been completed by your scheduled follow up appointment, we will need to reschedule this appointment.  At your follow up appointment he will explain results, continue neurological treatment or discharge you back to your referring physician.      Feel free to call or message me through my chart.  I'm here to assist you between appointments and can discuss concerns with Dr. Southwell as needed.      Changes in appointment and wait list requests can be made by calling our scheduling line at [phone].     Our goal is to provide you with the very best patient care. If we have not met this expectation, please call me to discuss as we appreciate opportunities for improvement.  We are very glad to have met you today and hope you were pleased with the care you received.     Information to contact nursing staff for Dr. Southwell  [phone]  [phone] fax  [phone] scheduling  Send message by mychart

## 2020-02-17 ENCOUNTER — Encounter: Admit: 2020-02-17 | Discharge: 2020-02-17 | Payer: Medicaid Other

## 2020-02-17 NOTE — Telephone Encounter
Called patein back to report that it is "ok" to stop the duloxetine abruptly per Dr. Alric Ran as low dose and recently started.     Discussed plan to have gentetic testing at [facility] and instructed patient to register    Specific genetic test is the "comprehensive neuropathy panel"  per Dr. Alric Ran.     Sent email to Reid Hope King in Neuromuscular on specific panel.

## 2020-02-17 NOTE — Telephone Encounter
Returned patients call. Patient started duloxetine 30 mg on 12/9/21and reports that almost immediatly started having bilateral cramping in legs with "pins and needles"  in feet. Feels like chin splints and both knees are swollen and painful. Patient saw PCP today and asks Dr. Alric Ran the revised plan. Patient is scheduled for MRI on Mar 12 2020    [facility] labs preferred per Bethena Roys in Neurology [phone] Not [facility] for  genetic testing. TTR panel printed for review.

## 2020-02-18 ENCOUNTER — Encounter: Admit: 2020-02-18 | Discharge: 2020-02-18 | Payer: Medicaid Other

## 2020-02-18 ENCOUNTER — Ambulatory Visit: Admit: 2020-02-18 | Discharge: 2020-02-19 | Payer: Medicaid Other

## 2020-02-23 ENCOUNTER — Encounter: Admit: 2020-02-23 | Discharge: 2020-02-23 | Payer: Medicaid Other

## 2020-02-23 NOTE — Telephone Encounter
Order placed for sponsored [facility] genetic testing

## 2020-02-26 ENCOUNTER — Encounter: Admit: 2020-02-26 | Discharge: 2020-02-26 | Payer: Medicaid Other

## 2020-02-29 ENCOUNTER — Encounter: Admit: 2020-02-29 | Discharge: 2020-02-29 | Payer: Medicaid Other

## 2020-02-29 NOTE — Telephone Encounter
Received a fax requesting cardiac clearance for patient to have a left knee arthroscopy with partial lateral meniscectomy on March 17, 2020 by Dr. Aundria Rud.    Will forward to Dr. Barry Dienes for his recommendations.

## 2020-03-01 ENCOUNTER — Encounter: Admit: 2020-03-01 | Discharge: 2020-03-01 | Payer: Medicaid Other

## 2020-03-01 NOTE — Telephone Encounter
Pt LVM in regard to her referral.  Ret'd call to pt and discussed referral. Pt started her bariatric surgery process with [facility] in [place], and the same surgeons at [facility] perform the surgeries at [facility].  Reminded pt that we spoke on 01/14/20, and she was advised, per the surgeons policy, she would need to continue her bariatric care at [facility].  Pt became upset stating, "my doctor wants all my care to be at [facility]."  Again advised pt that the bariatric surgeons have policies, and want her to remain under their care at [facility].  This nurse did explain she would coordinate with the program coordinator at [facility] to ensure they are aware she is to follow up there.  Pt hung up.

## 2020-03-08 ENCOUNTER — Encounter: Admit: 2020-03-08 | Discharge: 2020-03-08 | Payer: Medicaid Other

## 2020-03-08 NOTE — Telephone Encounter
Received call from patient stating that she was in the ER last night.Requested outside records from [facility]. Emailed [facility]  Last office visit 02/11/20  Follow up 08/11/20    Records received and sent to medical records    Plan:  1. Start Cymbalta 30 mg daily trial  2. DO NOT WALK BAREFOOT, FALL PREVENTION  3. Saw Neuromuscular clinic  4. Consider repeat EMG LEs  5. Order cane to assist balance and fall prevention  6. Patient declines PT until after knee surgery  7. Send for COVID-19 test due to recent exposure  8. Order MRI Cspine wo GAD  9. ?Order genetic panel for PN - rule out hereditary sensory autonomic neuropathy (HSAN) - unsure if insurance will cover   10. RTC 6 months

## 2020-03-12 ENCOUNTER — Encounter: Admit: 2020-03-12 | Discharge: 2020-03-12 | Payer: Medicaid Other

## 2020-03-15 NOTE — Telephone Encounter
Rec'd call from [facility], a physical therapist that is scheduled to work with Ms. Tina [name] post surgery.  She states that the patient wrote down Dr. Ricard Dillon as "her doctor."  Shirl Harris is wanting some background on this patient.    I called back and explained that it does look like Dr. Ricard Dillon cleared her for her recent surgery, however I do not see any notes about PT referrals.  I provided the numbers for her PCP and surgeon's office to follow up with them about this.

## 2020-03-22 ENCOUNTER — Encounter: Admit: 2020-03-22 | Discharge: 2020-03-22 | Payer: Medicaid Other

## 2020-03-22 NOTE — Telephone Encounter
Faxed office notes to  Medical supply attention Joaquim Nam [phone]

## 2020-03-25 ENCOUNTER — Encounter: Admit: 2020-03-25 | Discharge: 2020-03-25 | Payer: Medicaid Other

## 2020-03-27 ENCOUNTER — Encounter: Admit: 2020-03-27 | Discharge: 2020-03-27 | Payer: Medicaid Other

## 2020-03-27 ENCOUNTER — Ambulatory Visit: Admit: 2020-03-27 | Discharge: 2020-03-27 | Payer: Medicaid Other

## 2020-03-27 DIAGNOSIS — R200 Anesthesia of skin: Secondary | ICD-10-CM

## 2020-04-04 ENCOUNTER — Encounter: Admit: 2020-04-04 | Discharge: 2020-04-04 | Payer: Medicaid Other

## 2020-04-04 NOTE — Telephone Encounter
Patient called in to request physical therapy script to be sent to [facility]. Per last office note PT was declined until knee surgery was complete, which has not been done yet. She is scheduled for surgery in two weeks, and physical therapy was ordered for both knees through another provider.

## 2020-04-04 NOTE — Telephone Encounter
Pt called and informed us that her kit for genetic testing had not arrived. Called [facility] and the representative said that they had no records of an order being placed in the past. Pt updated her address and a new kit is being worked on

## 2020-04-05 ENCOUNTER — Encounter: Admit: 2020-04-05 | Discharge: 2020-04-05 | Payer: Medicaid Other

## 2020-05-11 ENCOUNTER — Ambulatory Visit: Admit: 2020-05-11 | Discharge: 2020-05-12 | Payer: Medicaid Other

## 2020-05-11 ENCOUNTER — Encounter: Admit: 2020-05-11 | Discharge: 2020-05-11 | Payer: Medicaid Other

## 2020-05-11 DIAGNOSIS — R0789 Other chest pain: Secondary | ICD-10-CM

## 2020-05-11 DIAGNOSIS — M2550 Pain in unspecified joint: Secondary | ICD-10-CM

## 2020-05-11 DIAGNOSIS — R519 Generalized headaches: Secondary | ICD-10-CM

## 2020-05-11 DIAGNOSIS — M479 Spondylosis, unspecified: Secondary | ICD-10-CM

## 2020-05-11 DIAGNOSIS — G935 Compression of brain: Secondary | ICD-10-CM

## 2020-05-11 DIAGNOSIS — J189 Pneumonia, unspecified organism: Secondary | ICD-10-CM

## 2020-05-11 DIAGNOSIS — N920 Excessive and frequent menstruation with regular cycle: Secondary | ICD-10-CM

## 2020-05-11 DIAGNOSIS — G43909 Migraine, unspecified, not intractable, without status migrainosus: Secondary | ICD-10-CM

## 2020-05-11 DIAGNOSIS — R079 Chest pain, unspecified: Secondary | ICD-10-CM

## 2020-05-11 DIAGNOSIS — M1990 Unspecified osteoarthritis, unspecified site: Secondary | ICD-10-CM

## 2020-05-11 DIAGNOSIS — F419 Anxiety disorder, unspecified: Secondary | ICD-10-CM

## 2020-05-11 DIAGNOSIS — E663 Overweight: Secondary | ICD-10-CM

## 2020-05-11 DIAGNOSIS — M797 Fibromyalgia: Secondary | ICD-10-CM

## 2020-05-11 DIAGNOSIS — IMO0002 Chiari malformation: Secondary | ICD-10-CM

## 2020-05-11 DIAGNOSIS — R011 Cardiac murmur, unspecified: Secondary | ICD-10-CM

## 2020-05-11 DIAGNOSIS — I2699 Other pulmonary embolism without acute cor pulmonale: Secondary | ICD-10-CM

## 2020-05-11 DIAGNOSIS — J45909 Unspecified asthma, uncomplicated: Secondary | ICD-10-CM

## 2020-05-11 DIAGNOSIS — S0990XA Unspecified injury of head, initial encounter: Secondary | ICD-10-CM

## 2020-05-11 DIAGNOSIS — J302 Other seasonal allergic rhinitis: Secondary | ICD-10-CM

## 2020-05-11 DIAGNOSIS — Q0700 Arnold-Chiari syndrome without spina bifida or hydrocephalus: Secondary | ICD-10-CM

## 2020-05-11 DIAGNOSIS — C801 Malignant (primary) neoplasm, unspecified: Secondary | ICD-10-CM

## 2020-05-11 DIAGNOSIS — R52 Pain, unspecified: Secondary | ICD-10-CM

## 2020-05-11 DIAGNOSIS — R002 Palpitations: Secondary | ICD-10-CM

## 2020-05-11 DIAGNOSIS — F32A Depression: Secondary | ICD-10-CM

## 2020-05-11 DIAGNOSIS — F909 Attention-deficit hyperactivity disorder, unspecified type: Secondary | ICD-10-CM

## 2020-05-11 DIAGNOSIS — E079 Disorder of thyroid, unspecified: Secondary | ICD-10-CM

## 2020-05-11 DIAGNOSIS — G971 Other reaction to spinal and lumbar puncture: Secondary | ICD-10-CM

## 2020-05-11 DIAGNOSIS — J4520 Mild intermittent asthma, uncomplicated: Secondary | ICD-10-CM

## 2020-05-11 DIAGNOSIS — K219 Gastro-esophageal reflux disease without esophagitis: Secondary | ICD-10-CM

## 2020-05-11 DIAGNOSIS — M549 Dorsalgia, unspecified: Secondary | ICD-10-CM

## 2020-05-11 DIAGNOSIS — R110 Nausea: Secondary | ICD-10-CM

## 2020-05-11 DIAGNOSIS — I82409 Acute embolism and thrombosis of unspecified deep veins of unspecified lower extremity: Secondary | ICD-10-CM

## 2020-05-11 NOTE — Telephone Encounter
Patient calling to go over MRI results. Patient wanting to know if there's anything else we could be doing for her. Routing to provider for review.    C4-C5: Mild annular bulge.   C5-C6: Mild annular bulge. Mild to moderate bilateral foraminal stenosis.   C6-C7: Mild annular bulge. Mild to moderate bilateral foraminal stenosis.     IMPRESSION     1. 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   2. 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

## 2020-05-11 NOTE — Patient Instructions
It was a pleasure seeing you in clinic today.     > Continue using your Symbicort 2 puffs as needed. If you notice your symptoms are starting to flare (like in the summer months), you can start using it twice daily scheduled.  > Follow-up with your primary care doctor and make sure he is comfortable refilling your prescriptions. Please give [place] a call if you need to be seen.     Please contact my nurse Estanislado Spire, RN with signs and symptoms of worsening condition:   increased shortness of breath   increased coughing   new or increased productive cough with thick &/ or abnormal colored sputum   bloody sputum   chest tightness   chest pain   wheezing   fever, chills, or night sweats     Or with any questions or concerns. Tillie Rung can be reached at [phone].    - For URGENT issues after business hours, weekends, or holidays: Call [phone] and request for the Pulmonary Fellow to be paged.   - For refills on medications: Ask your pharmacy to send an electronic refill request. Please allow at least 3 business days for refill requests.   - For equipment issues, please contact your Durable Medical Equipment (DME) company directly.   - To cancel, change, or schedule a clinic appointment: Call our Pulmonary Schedulers at [phone].

## 2020-05-11 NOTE — Telephone Encounter
Patient informed. Scheduled appt for 4/11. Patient prefers to see Dr. Sela Hua.

## 2020-05-12 ENCOUNTER — Encounter: Admit: 2020-05-12 | Discharge: 2020-05-12 | Payer: Medicaid Other

## 2020-05-12 NOTE — Telephone Encounter
Results from [facility] genetic testing came in. Result: Negative. Full document scanned.

## 2020-05-28 ENCOUNTER — Encounter: Admit: 2020-05-28 | Discharge: 2020-05-28 | Payer: Medicaid Other

## 2020-06-13 ENCOUNTER — Ambulatory Visit: Admit: 2020-06-13 | Discharge: 2020-06-13 | Payer: Medicaid Other

## 2020-06-13 ENCOUNTER — Encounter: Admit: 2020-06-13 | Discharge: 2020-06-13 | Payer: Medicaid Other

## 2020-06-13 DIAGNOSIS — M479 Spondylosis, unspecified: Secondary | ICD-10-CM

## 2020-06-13 DIAGNOSIS — R079 Chest pain, unspecified: Secondary | ICD-10-CM

## 2020-06-13 DIAGNOSIS — M797 Fibromyalgia: Secondary | ICD-10-CM

## 2020-06-13 DIAGNOSIS — R0789 Other chest pain: Secondary | ICD-10-CM

## 2020-06-13 DIAGNOSIS — R52 Pain, unspecified: Secondary | ICD-10-CM

## 2020-06-13 DIAGNOSIS — R110 Nausea: Secondary | ICD-10-CM

## 2020-06-13 DIAGNOSIS — N920 Excessive and frequent menstruation with regular cycle: Secondary | ICD-10-CM

## 2020-06-13 DIAGNOSIS — C801 Malignant (primary) neoplasm, unspecified: Secondary | ICD-10-CM

## 2020-06-13 DIAGNOSIS — M542 Cervicalgia: Secondary | ICD-10-CM

## 2020-06-13 DIAGNOSIS — I82409 Acute embolism and thrombosis of unspecified deep veins of unspecified lower extremity: Secondary | ICD-10-CM

## 2020-06-13 DIAGNOSIS — R519 Generalized headaches: Secondary | ICD-10-CM

## 2020-06-13 DIAGNOSIS — J45909 Unspecified asthma, uncomplicated: Secondary | ICD-10-CM

## 2020-06-13 DIAGNOSIS — S0990XA Unspecified injury of head, initial encounter: Secondary | ICD-10-CM

## 2020-06-13 DIAGNOSIS — M1990 Unspecified osteoarthritis, unspecified site: Secondary | ICD-10-CM

## 2020-06-13 DIAGNOSIS — G935 Compression of brain: Secondary | ICD-10-CM

## 2020-06-13 DIAGNOSIS — M2550 Pain in unspecified joint: Secondary | ICD-10-CM

## 2020-06-13 DIAGNOSIS — IMO0002 Chiari malformation: Secondary | ICD-10-CM

## 2020-06-13 DIAGNOSIS — I2699 Other pulmonary embolism without acute cor pulmonale: Secondary | ICD-10-CM

## 2020-06-13 DIAGNOSIS — R002 Palpitations: Secondary | ICD-10-CM

## 2020-06-13 DIAGNOSIS — F419 Anxiety disorder, unspecified: Secondary | ICD-10-CM

## 2020-06-13 DIAGNOSIS — Q0700 Arnold-Chiari syndrome without spina bifida or hydrocephalus: Secondary | ICD-10-CM

## 2020-06-13 DIAGNOSIS — J189 Pneumonia, unspecified organism: Secondary | ICD-10-CM

## 2020-06-13 DIAGNOSIS — E663 Overweight: Secondary | ICD-10-CM

## 2020-06-13 DIAGNOSIS — K219 Gastro-esophageal reflux disease without esophagitis: Secondary | ICD-10-CM

## 2020-06-13 DIAGNOSIS — G43909 Migraine, unspecified, not intractable, without status migrainosus: Secondary | ICD-10-CM

## 2020-06-13 DIAGNOSIS — E079 Disorder of thyroid, unspecified: Secondary | ICD-10-CM

## 2020-06-13 DIAGNOSIS — G971 Other reaction to spinal and lumbar puncture: Secondary | ICD-10-CM

## 2020-06-13 DIAGNOSIS — M7918 Myalgia, other site: Secondary | ICD-10-CM

## 2020-06-13 DIAGNOSIS — M549 Dorsalgia, unspecified: Secondary | ICD-10-CM

## 2020-06-13 DIAGNOSIS — F32A Depression: Secondary | ICD-10-CM

## 2020-06-13 DIAGNOSIS — F909 Attention-deficit hyperactivity disorder, unspecified type: Secondary | ICD-10-CM

## 2020-06-13 DIAGNOSIS — H903 Sensorineural hearing loss, bilateral: Secondary | ICD-10-CM

## 2020-06-13 DIAGNOSIS — R011 Cardiac murmur, unspecified: Secondary | ICD-10-CM

## 2020-06-13 NOTE — Patient Instructions
It was nice to see you today. Thank you for choosing to visit our clinic.      Your time is important and if you had to wait today, we do apologize. Our goal is to run exactly on time; however, on occasion, we get behind in clinic due to unexpected patient issues. Thank you for your patience.    General Instructions:   How to reach me: Please send a MyChart message to the [facility] Center or leave a voicemail for my nurse, Cara , at [phone].   How to get a medication refill: Please use the MyChart Refill request or contact your pharmacy directly to request medication refills. Please allow 72 business hours for request to be completed.   How to receive your test results: If you have signed up for MyChart, you will receive your test results and messages from me this way. Otherwise, you will get a phone call or letter. If you are expecting results and have not heard from my office within 2 weeks of your testing, please send a MyChart message or call my office.   Scheduling: Our scheduling phone number is [phone].   Appointment Reminders on your cell phone: Make sure we have your cell phone number, and Text [place] to [phone].   Support for many chronic illnesses is available through [facility]: [facility] or [phone].   For questions on nights, weekends or holidays, call the Operator at [phone], and ask for the doctor on call for [facility].      Again, thank you for coming in today.    ________________________________________________________________

## 2020-06-13 NOTE — Progress Notes
Date of Service: 06/13/2020    Subjective:             Tina [name] is a 50 y.o. female.    History of Present Illness  Here for her 1 year check on her hearing.  She has noted occasional earaches.  She said it is really bad about 9 or 10 months ago where she was placed on antibiotic and eardrops.  She has intermittent buzzing sensation.  She is having little more difficulty with hearing especially mixed noise.  She is also having some other cognitive and other issues related to PTSD.  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     Review of Systems   Constitutional: Negative.    HENT: Positive for ear pain and tinnitus.    Eyes: Negative.    Respiratory: Negative.    Cardiovascular: Negative.    Gastrointestinal: Negative.    Endocrine: Negative.    Genitourinary: Negative.    Musculoskeletal: Negative.    Skin: Negative.    Allergic/Immunologic: Negative.    Neurological: Negative.    Hematological: Negative.    Psychiatric/Behavioral: Negative.          Objective:         ? albuterol sulfate (PROAIR HFA) 90 mcg/actuation HFA aerosol inhaler Inhale two puffs by mouth into the lungs every 6 hours as needed for Wheezing or Shortness of Breath. Shake well before use.   ? budesonide/formoterol (SYMBICORT HFA) 80-4.5 mcg/actuation inhalation Inhale 2 puffs by mouth twice daily   ? EPINEPHrine(+) (EPIPEN) 1 mg/mL injection pen Inject 0.3 mg to area(s) as directed once as needed.   ? eszopiclone (LUNESTA) 2 mg tablet TAKE 1 TABLET BY MOUTH AT BEDTIME AS NEEDED FOR INSOMNIA   ? hydroCHLOROthiazide (HYDRODIURIL) 25 mg tablet Take one tablet by mouth every morning.   ? levothyroxine (SYNTHROID) 150 mcg tablet Take six days a week and (half a tablet) one day a week. Take 30 min before other food,drink,meds   ? potassium chloride SR (K-DUR) 10 mEq tablet Take 10 mEq by mouth twice daily. Take with a meal and a full glass of water.   ? prazosin (MINIPRESS) 2 mg capsule TAKE 1 CAPSULE BY MOUTH ONCE DAILY AT BEDTIME AS NEEDED FOR NIGHTMARES     Vitals:    06/13/20 1248   BP: 129/80   Pulse: 92   Weight: 122.5 kg (270 lb)   Height: 170.2 cm (5' 7.01)   PainSc: Zero     Body mass index is 42.28 kg/m?Marland Kitchen     Physical Exam     Constitutional: She appears well-developed and well-nourished.   HENT:   Normal voice  Head: Normocephalic and atraumatic.   Right Ear: Tympanic membrane, external ear and ear canal normal. No drainage, swelling or tenderness. No middle ear effusion.   Left Ear: Tympanic membrane, external ear and ear canal normal. No drainage, swelling or tenderness.  No middle ear effusion.   Eyes: EOM and lids are normal.   Neck: Trachea normal, normal range of motion and phonation normal. No thyromegaly present.   No adenopathy.  Salivary Glands: normal  Cranial nerves: 2-12 normal      Testing:    Audio date 06/13/2020  Right 35/28 dB 100%  Left 33/28 dB 100%   Normal tympanometry.  There is a bilateral mild to moderate central hearing loss with slight changes from previous exam.         Assessment and Plan:  50 year old with possible fluctuating hearing loss.  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  We will set her up for hearing aid consultation and repeat the hearing test in 6 months or so.  There was some inconsistencies noted during testing.

## 2020-07-06 ENCOUNTER — Encounter: Admit: 2020-07-06 | Discharge: 2020-07-06 | Payer: Medicaid Other

## 2020-07-06 ENCOUNTER — Ambulatory Visit: Admit: 2020-07-06 | Discharge: 2020-07-07 | Payer: Medicaid Other

## 2020-07-06 DIAGNOSIS — Q0700 Arnold-Chiari syndrome without spina bifida or hydrocephalus: Secondary | ICD-10-CM

## 2020-07-06 DIAGNOSIS — M549 Dorsalgia, unspecified: Secondary | ICD-10-CM

## 2020-07-06 DIAGNOSIS — F32A Depression: Secondary | ICD-10-CM

## 2020-07-06 DIAGNOSIS — R002 Palpitations: Secondary | ICD-10-CM

## 2020-07-06 DIAGNOSIS — M479 Spondylosis, unspecified: Secondary | ICD-10-CM

## 2020-07-06 DIAGNOSIS — M1990 Unspecified osteoarthritis, unspecified site: Secondary | ICD-10-CM

## 2020-07-06 DIAGNOSIS — R519 Generalized headaches: Secondary | ICD-10-CM

## 2020-07-06 DIAGNOSIS — R011 Cardiac murmur, unspecified: Secondary | ICD-10-CM

## 2020-07-06 DIAGNOSIS — J189 Pneumonia, unspecified organism: Secondary | ICD-10-CM

## 2020-07-06 DIAGNOSIS — G971 Other reaction to spinal and lumbar puncture: Secondary | ICD-10-CM

## 2020-07-06 DIAGNOSIS — N920 Excessive and frequent menstruation with regular cycle: Secondary | ICD-10-CM

## 2020-07-06 DIAGNOSIS — E559 Vitamin D deficiency, unspecified: Secondary | ICD-10-CM

## 2020-07-06 DIAGNOSIS — G43909 Migraine, unspecified, not intractable, without status migrainosus: Secondary | ICD-10-CM

## 2020-07-06 DIAGNOSIS — E079 Disorder of thyroid, unspecified: Secondary | ICD-10-CM

## 2020-07-06 DIAGNOSIS — R079 Chest pain, unspecified: Secondary | ICD-10-CM

## 2020-07-06 DIAGNOSIS — F419 Anxiety disorder, unspecified: Secondary | ICD-10-CM

## 2020-07-06 DIAGNOSIS — F909 Attention-deficit hyperactivity disorder, unspecified type: Secondary | ICD-10-CM

## 2020-07-06 DIAGNOSIS — R52 Pain, unspecified: Secondary | ICD-10-CM

## 2020-07-06 DIAGNOSIS — IMO0002 Chiari malformation: Secondary | ICD-10-CM

## 2020-07-06 DIAGNOSIS — J45909 Unspecified asthma, uncomplicated: Secondary | ICD-10-CM

## 2020-07-06 DIAGNOSIS — G935 Compression of brain: Secondary | ICD-10-CM

## 2020-07-06 DIAGNOSIS — S0990XA Unspecified injury of head, initial encounter: Secondary | ICD-10-CM

## 2020-07-06 DIAGNOSIS — I2699 Other pulmonary embolism without acute cor pulmonale: Secondary | ICD-10-CM

## 2020-07-06 DIAGNOSIS — C801 Malignant (primary) neoplasm, unspecified: Secondary | ICD-10-CM

## 2020-07-06 DIAGNOSIS — I82409 Acute embolism and thrombosis of unspecified deep veins of unspecified lower extremity: Secondary | ICD-10-CM

## 2020-07-06 DIAGNOSIS — R0789 Other chest pain: Secondary | ICD-10-CM

## 2020-07-06 DIAGNOSIS — M797 Fibromyalgia: Secondary | ICD-10-CM

## 2020-07-06 DIAGNOSIS — E663 Overweight: Secondary | ICD-10-CM

## 2020-07-06 DIAGNOSIS — M2550 Pain in unspecified joint: Secondary | ICD-10-CM

## 2020-07-06 DIAGNOSIS — E892 Postprocedural hypoparathyroidism: Secondary | ICD-10-CM

## 2020-07-06 DIAGNOSIS — R110 Nausea: Secondary | ICD-10-CM

## 2020-07-06 DIAGNOSIS — E890 Postprocedural hypothyroidism: Secondary | ICD-10-CM

## 2020-07-06 DIAGNOSIS — K219 Gastro-esophageal reflux disease without esophagitis: Secondary | ICD-10-CM
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
## 2020-07-27 ENCOUNTER — Ambulatory Visit: Admit: 2020-07-27 | Discharge: 2020-07-28 | Payer: Medicaid Other

## 2020-07-27 ENCOUNTER — Encounter: Admit: 2020-07-27 | Discharge: 2020-07-27 | Payer: Medicaid Other

## 2020-07-27 DIAGNOSIS — E663 Overweight: Secondary | ICD-10-CM

## 2020-07-27 DIAGNOSIS — L821 Other seborrheic keratosis: Secondary | ICD-10-CM

## 2020-07-27 DIAGNOSIS — F32A Depression: Secondary | ICD-10-CM

## 2020-07-27 DIAGNOSIS — F419 Anxiety disorder, unspecified: Secondary | ICD-10-CM

## 2020-07-27 DIAGNOSIS — M1990 Unspecified osteoarthritis, unspecified site: Secondary | ICD-10-CM

## 2020-07-27 DIAGNOSIS — M797 Fibromyalgia: Secondary | ICD-10-CM

## 2020-07-27 DIAGNOSIS — IMO0002 Chiari malformation: Secondary | ICD-10-CM

## 2020-07-27 DIAGNOSIS — M2550 Pain in unspecified joint: Secondary | ICD-10-CM

## 2020-07-27 DIAGNOSIS — R519 Generalized headaches: Secondary | ICD-10-CM

## 2020-07-27 DIAGNOSIS — C801 Malignant (primary) neoplasm, unspecified: Secondary | ICD-10-CM

## 2020-07-27 DIAGNOSIS — R52 Pain, unspecified: Secondary | ICD-10-CM

## 2020-07-27 DIAGNOSIS — K219 Gastro-esophageal reflux disease without esophagitis: Secondary | ICD-10-CM

## 2020-07-27 DIAGNOSIS — J45909 Unspecified asthma, uncomplicated: Secondary | ICD-10-CM

## 2020-07-27 DIAGNOSIS — R011 Cardiac murmur, unspecified: Secondary | ICD-10-CM

## 2020-07-27 DIAGNOSIS — G971 Other reaction to spinal and lumbar puncture: Secondary | ICD-10-CM

## 2020-07-27 DIAGNOSIS — R0789 Other chest pain: Secondary | ICD-10-CM

## 2020-07-27 DIAGNOSIS — N920 Excessive and frequent menstruation with regular cycle: Secondary | ICD-10-CM

## 2020-07-27 DIAGNOSIS — J189 Pneumonia, unspecified organism: Secondary | ICD-10-CM

## 2020-07-27 DIAGNOSIS — G43909 Migraine, unspecified, not intractable, without status migrainosus: Secondary | ICD-10-CM

## 2020-07-27 DIAGNOSIS — L820 Inflamed seborrheic keratosis: Secondary | ICD-10-CM

## 2020-07-27 DIAGNOSIS — I2699 Other pulmonary embolism without acute cor pulmonale: Secondary | ICD-10-CM

## 2020-07-27 DIAGNOSIS — R002 Palpitations: Secondary | ICD-10-CM

## 2020-07-27 DIAGNOSIS — I82409 Acute embolism and thrombosis of unspecified deep veins of unspecified lower extremity: Secondary | ICD-10-CM

## 2020-07-27 DIAGNOSIS — G935 Compression of brain: Secondary | ICD-10-CM

## 2020-07-27 DIAGNOSIS — R079 Chest pain, unspecified: Secondary | ICD-10-CM

## 2020-07-27 DIAGNOSIS — F909 Attention-deficit hyperactivity disorder, unspecified type: Secondary | ICD-10-CM

## 2020-07-27 DIAGNOSIS — S0990XA Unspecified injury of head, initial encounter: Secondary | ICD-10-CM

## 2020-07-27 DIAGNOSIS — M479 Spondylosis, unspecified: Secondary | ICD-10-CM

## 2020-07-27 DIAGNOSIS — E079 Disorder of thyroid, unspecified: Secondary | ICD-10-CM

## 2020-07-27 DIAGNOSIS — M549 Dorsalgia, unspecified: Secondary | ICD-10-CM

## 2020-07-27 DIAGNOSIS — D229 Melanocytic nevi, unspecified: Secondary | ICD-10-CM

## 2020-07-27 DIAGNOSIS — Q0700 Arnold-Chiari syndrome without spina bifida or hydrocephalus: Secondary | ICD-10-CM

## 2020-07-27 DIAGNOSIS — R110 Nausea: Secondary | ICD-10-CM

## 2020-07-27 NOTE — Progress Notes
ATTESTATION    I personally performed the key portions of the E/M visit, discussed case with resident and concur with resident documentation of history, physical exam, assessment, and treatment plan unless otherwise noted. I performed cryotherapy to lesions today .Patient informed that a blistering reaction is to be expected and that a hypopigmented scar may result.         Staff name:  Darl Pikes, MD Date:  07/27/2020

## 2020-07-27 NOTE — Patient Instructions
Sun Protective Clothing: www.coolibar.com    What to look for in a sunscreen:   - Containing either zinc oxide or titanium dioxide   - SPF 50+  - Broad-spectrum     Here is a list of recommended zinc-based sunscreen brands (alphabetical order)   -Check that the ingredient list to make sure it is zinc-based  -Cyndia Bent  -Bare Minerals  -Blue Lizard  -CeraVe  -Cotz  -Goddess Garden  -Green Screen  -Honest Company  -Mineral Fusion  -SkinCeuticals  -Vanicream    Recommend loving naturals sunscreen lip balm      TIPS FOR SUN PROTECTION  - Used 2 shot-glases worth to cover whole body  - Generously apply broad-spectrum water resistant sunscreen with SPF 30 or greater. Reapply every 2 hours.  - Wear protective clothing (wide-brimmed hat, long-sleeves, sunglasses)  - Seek shade when possible (sun's rays peak 10am-4pm)  - Use extra caution near water, snow and sand  - Get Vitamin D safely thought healthy diet and supplements if needed  - AVOID TANNING BEDS  - Check your birthday suit on your birthday---check for any new or changing moles, if anything is changing, growing, bleeding please see your dermatologist        Vitamin D  - We recommend Vitamin D supplementation after a fatty meal, especially if there is no contraindications such as kidney stones    Moles  --Common melanocytic nevi (moles) tend to be =6 mm in diameter and symmetric with even pigmentation, round or oval shape, regular outline, and sharp, non-fuzzy border.  --Dermal nevi stick out from the skin, but if they are soft they are usually not worrisome.  --Flaky seemingly stuck-on brown bumps are usually benign keratoses, not moles.  --Bright red smooth bumps that do not bleed are usually benign blood vessel lesions (cherry angiomas), not moles.  --Atypical nevi/clinical features of possible melanoma include asymmetry, border irregularities, color variability, and diameter >6 mm.  The earliest sign of melanoma is usually a rapidly growing mole.  --About half of melanoma arises in an existing mole and up to half on normal skin.  --It is normal to get new moles until the age of 28-38 years old.  --Multiple atypical nevi are a marker of increased risk of melanoma. The risk of melanoma depends also upon the total number of nevi, family and/or personal history of melanoma, and sun exposure history.   --It is important to look at your moles once a month.  It may help to follow them with photos such as on your smart phone.  Looking once a month you can notice rapid changes and call if these occur.  --Using sunscreens and sun avoidance will decrease your risk of developing melanoma.  The best sun protection is sun avoidance, including with clothing such as long sleeves and a broad brimmed hat.  The best sunscreens are SPF 30 or above cream based with zinc oxide.  One ounce (shot glass sized) amount is needed for an adult.  It should be reapplied every 2 hours if possible.  --Any tanning bed use will increase your risk of melanoma significantly.    Sun Protection   UPF/SPF rated clothing (gloves, long sleeves, scarves); broad-brimmed hats (NOT ball caps!)   RIT Sunguard laundry additive can increase the SPF value of your everyday clothing   Cowboy hats protect from sun; baseball hats don't   Here are several recommended zinc-based sunscreen brands in aplphabetical order (always read the ingredient list, as many brands have multiple  varieties of sunscreens and not all are zinc-based)   [address], Coca Cola, [employer], [place], [place], Goddess Garden, RadioShack Screen,  McKesson, Forensic scientist, SkinCeuticals, Vanicream   2 shot-glases = whole body    Melanoma Patient Information    Also called malignant melanoma     Skin cancer screening: If you notice a mole that differs from others or one that changes, bleeds, or itches, see a dermatologist.   Melanoma is a type of skin cancer. Anyone can get melanoma. When found early and treated, the cure rate is nearly 100%. Allowed to grow, melanoma can spread to other parts of the body. Melanoma can spread quickly. When melanoma spreads, it can be deadly.Dermatologists believe that the number of deaths from melanoma would be much lower if people:  Knew the warning signs of melanoma.   Learned how to examine their skin for signs of skin cancer.   Took the time to examine their skin.   It's important to take time to look at the moles on your skin because this is a good way to find melanoma early. When checking your skin, you should look for the ABCDEs of melanoma.     ABCDE's of melanoma:  When performing monthly skin exams for your moles or new moles, remember the ABCDE's of melanoma:    A - Asymmetry. (Concerning if spot is not symmetric)  B - Border. (Irregular border or notched border are concerning)  C - Color. (Multiple colors or changes in color are concerning.)  D - Diameter. (Larger than 6mm, ie, a pencil eraser, is concerning.)  E - Evolution. (An evolving or changing spot is concerning. If new itch, tenderness, or bleeding develop, these are concerning changes too.  See further explanation below:    Melanoma: Signs and symptoms     Anyone can get melanoma. It's important to take time to look at the moles on your skin because this is a good way to find melanoma early. When checking your skin, you should look for the ABCDEs of melanoma.     ABCDEs of melanoma     A = Asymmetry  One half is unlike the other half.       B = Border  An irregular, scalloped, or poorly defined border.       C = Color  Is varied from one area to another; has shades of tan, brown or black, or is sometimes white, red, or blue.       D = Diameter  Melanomas usually greater than 6mm (the size of a pencil eraser) when diagnosed, but they can be smaller.       E = Evolving  A mole or skin lesion that looks different from the rest or is changing in size, shape, or color.    !! If you see a mole or new spot on your skin that has any of the ABCDEs, immediately make an appointment to see a dermatologist.    Signs of melanoma  The most common early signs (what you see) of melanoma are:     Growing mole on your skin.   Unusual looking mole on your skin or a mole that does not look like any other mole on your skin (the ugly duckling).   Non-uniform mole (has an odd shape, uneven or uncertain border, different colors).     Symptoms of melanoma  In the early stages, melanoma may not cause any symptoms (what you feel). But sometimes melanoma will:  Itch.    Bleed.    Feel painful.   Many melanomas have these signs and symptoms, but not all. There are different types of melanoma. One type can first appear as a brown or black streak underneath a fingernail or toenail. Melanoma also can look like a bruise that just won't heal.     Who gets melanoma?  Anyone can get melanoma. Most people who get it have light skin, but people who have brown and black skin also get melanoma.   Some people have a higher risk of getting melanoma. These people have the following traits:   Skin    Fair skin (The risk is higher if the person also has red or blond hair and blue or green eyes).    Sun-sensitive skin (rarely tans or burns easily).    50-plus moles, large moles, or unusual-looking moles.    If you have had bad sunburns or spent time tanning (sun, tanning beds, or sun lamps), you also have a higher risk of getting melanoma.   Men older than 50 are at a higher risk for developing skin cancers, including melanoma. Learning how to check your skin and getting skin exams can help detect skin cancer.    Family/medical history   Melanoma runs in the family (parent, child, sibling, cousin, aunt, uncle had melanoma).   You had another skin cancer, but most especially another melanoma.   A weakened immune system.      Research shows that indoor tanning increases a person's melanoma risk by 75%. The risk also may increase if you had breast or thyroid cancer.    More people getting melanoma  Fewer people are getting most types of cancer. Melanoma is different. More people are getting melanoma. Many are white men who are 50 years or older. More young people also are getting melanoma. Melanoma is now the most common cancer among people 63-65 years old. Even teenagers are getting melanoma.    What causes melanoma?  Ultraviolet (UV) radiation is a major contributor in most cases. We get UV radiation from the sun, tanning beds, and sun lamps. Heredity also plays a role. Research shows that if a close blood relative (parent, child, sibling, aunt, uncle) had melanoma, a person has a much greater risk of getting melanoma.     How do dermatologists diagnose melanoma?  To diagnose melanoma, a dermatologist begins by looking at the patient's skin. A dermatologist will carefully examine moles and other suspicious spots. To get a better look, a dermatologist may use a device called a dermoscope.      Vitamin D    Our bodies need vitamin D to build strong and healthy bones. Vitamin D helps the body absorb the calcium that our bones require.     For a healthy person, the recommended daily dietary allowance is 600 international units for people of 84-54 years old, and 800 international units for people older than 71 years. More vitamin D is not better. Higher amounts of vitamin D could be harmful, leading to many health problems such as high blood pressure and kidney damage.     American academy of Dermatology recommending everyone get vitamin D from foods naturally rich in vitamin D, foods and beverages fortified with vitamin D or vitamin D supplements. The foods that contain the greatest amount are fatty fishes such as salmon, tuna and mackerel. Fish liver oil is another good source.     One of the sources to look up vitamin amount is  through [employer]. PrankTips.hu. This can help you find out whether you get enough vitamin D from your diet. If you are like many people, you may not be getting your recommended dietary allowance of vitamin D. You may want to change the foods that you eat or take a vitamin D supplements. Before you start taking a vitamin D supplement, talk with your doctor.     Vitamin D is produced in the skin by UV light, but the amount is highly variable and depends on many factors. However, getting vitamin D from the sun or tanning beds can 1) increase your risk of developing skin cancer including melanoma which can be deadly, 2) resulting premature skin aging (wrinkles, age spots, blotchy complexion) and 3) leading to a weakened immune system. Therefore, Teacher, music of Dermatology recommend getting vitamin D safely from foods, beverages and supplements.

## 2020-07-27 NOTE — Progress Notes
Date of Service: 07/27/2020    Subjective:             Tina [name] is a 50 y.o. female.    History of Present Illness    Return pt. LV 07/08/19  ?  # Multiple melanocytic nevi:  - Patient has a history of brown and tan spots distributed over the head, trunk, arms and legs.?   - These have been present for many years.?  - There is  history of blistering sunburns and tanning bed use.  - None are itching, bleeding, or painful  ?  # Patient has a history of brown bumps on the torso and extremities.  - Lesions have become more numerous with age  ?  Personal Hx:?No?history of skin cancer.   Family Hx:?No?history of skin cancer  Social Hx:?Retired and disability?       Review of Systems   Constitutional: Negative for appetite change and unexpected weight change.   Gastrointestinal: Negative for diarrhea, nausea and vomiting.         Objective:         ? albuterol sulfate (PROAIR HFA) 90 mcg/actuation HFA aerosol inhaler Inhale two puffs by mouth into the lungs every 6 hours as needed for Wheezing or Shortness of Breath. Shake well before use.   ? budesonide/formoterol (SYMBICORT HFA) 80-4.5 mcg/actuation inhalation Inhale 2 puffs by mouth twice daily   ? EPINEPHrine(+) (EPIPEN) 1 mg/mL injection pen Inject 0.3 mg to area(s) as directed once as needed.   ? eszopiclone (LUNESTA) 2 mg tablet TAKE 1 TABLET BY MOUTH AT BEDTIME AS NEEDED FOR INSOMNIA   ? famotidine (PEPCID) 40 mg/5 mL (8 mg/mL) oral suspension Take  by mouth twice daily.   ? hydroCHLOROthiazide (HYDRODIURIL) 25 mg tablet Take one tablet by mouth every morning.   ? levothyroxine (SYNTHROID) 150 mcg tablet Take six days a week and (half a tablet) one day a week. Take 30 min before other food,drink,meds   ? phentermine (ADIPEX-P) 37.5 mg tablet Take 37.5 mg by mouth daily.   ? potassium chloride SR (K-DUR) 10 mEq tablet Take 30 mEq by mouth daily. Take with a meal and a full glass of water.    ? prazosin (MINIPRESS) 2 mg capsule TAKE 1 CAPSULE BY MOUTH ONCE DAILY AT BEDTIME AS NEEDED FOR NIGHTMARES   ? topiramate (TOPAMAX) 25 mg tablet Take 25 mg by mouth twice daily.     Vitals:    07/27/20 0923   Resp: 18   PainSc: Three   Weight: 115.7 kg (255 lb)   Height: 170.2 cm (5' 7)     Body mass index is 39.94 kg/m?Marland Kitchen     Physical Exam  Areas Examined (all normal unless noted below):  Head/Face  Neck  Chest  Back  Abdomen  R upper ext  L upper ext    FBSE deferred by pt today  ?  Pertinent findings include:  - Multiple brown and tan evenly pigmented macules are distributed over the head, neck,?arms.??All have symmetric similar dermascopic findings with primarily globular?and reticular patterns.  - Soft, pigmented, stuck-on-appearing papules are distributed over the examined areas.  All have symmetric pebbled dermoscopic findings.               Inflamed lesions on back x1, L neck x1  ?  Assessment and Plan:  ?  # Melanocytic nevi  - Will cont to monitor  - Counseled on ABCD's of melanoma  - Counseled on photoprotection  with at least SPF 30+ sunscreen (handout provided at check-out)  - Recommend OTC Vit. D supplementation daily (600-800 units daily)  - RTC for any new, changing or symptomatic lesions  - Encouraged to continue to monitor nevi at home  ?  # Seb K's  # Irritated Seborrheic Keratoses  - reassurance for benign nature of lesions  - LN2 f/t/f x 2 lesions  ?  RTC in 12 months or sooner prn  ?     Liquid Nitrogen Procedure Note    Risk and benefits of the above procedure including pain, dyspigmentation, scar, infection, recurrence were discussed with the patient (or legal guardian) in detail, who afterwards decided to proceed with the procedure.    Verbal informed consent given  Diagnosis: See progress note  Body site: See progress note  Number of lesions: See progress note  Cycle duration: 10 sec  Number or cycles: 2   Wound care instructions given: Yes  Complications:  None  Tolerated well:  Yes  Ambulated from room:  Yes  Duration of procedure: > 

## 2020-08-04 ENCOUNTER — Encounter: Admit: 2020-08-04 | Discharge: 2020-08-04 | Payer: Medicaid Other

## 2020-08-11 ENCOUNTER — Ambulatory Visit: Admit: 2020-08-11 | Discharge: 2020-08-11 | Payer: Medicaid Other

## 2020-08-11 DIAGNOSIS — R209 Unspecified disturbances of skin sensation: Secondary | ICD-10-CM

## 2020-08-11 NOTE — Progress Notes
Karolee Ohs will say date of Service: 08/11/2020     Subjective:               Tina [name] is a 50 y.o. female.        History of Present Illness    Her last visit with me was in December 2021 for's bilateral foot dysesthesia.  There was an abnormal EMG but a normal skin biopsy so question of polyneuropathy is unresolved.  I considered central pain sensitivity as well.  I started Cymbalta 30 mg daily and recommended a cane to assist with balance and fall prevention.  She declined physical therapy.    I ordered MRI cervical spine done in January that showed mild spondylosis.  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  Skin biopsy October 2021 showed very normal nerve fiber density of 9.3 in the distal leg and 8.7 of the proximal leg.    There was concern that it was a centrally mediated process.  She was seen in neuromuscular clinic and the thought was that EMG abnormality was due to edema and that sensory nerve absence was likely as a consequence.    She continues to have periodic numbness and tingling.  The tingling is better than it was before.    She had 1 fall since last time.  She is holding onto a railing and looked up and then just went out.  This happened a couple months ago.  There have been no other events or near events.    She denies any other neurological complaints.  She admits to a lot of anxiety regarding family concerns related to her grandson.             Review of Systems   Constitutional: Negative.    HENT: Negative.    Eyes: Negative.    Respiratory: Negative.    Cardiovascular: Negative.    Gastrointestinal: Negative.    Endocrine: Negative.    Genitourinary: Negative.    Musculoskeletal: Negative.    Skin: Negative.    Allergic/Immunologic: Negative.    Neurological: Negative.    Hematological: Negative.    Psychiatric/Behavioral: Negative.        Chief Complaint:  Chief Complaint   Patient presents with   ? Left Foot - Pain   ? Right Foot - Pain   ? Follow Up       Past Medical History:  Medical History:   Diagnosis Date   ? Acid reflux    ? ADHD (attention deficit hyperactivity disorder)     Have had since I was a child   ? Anxiety     panic attacks   ? Arnold-Chiari malformation (HCC)    ? Arthritis    ? Asthma    ? Cancer [facility])     cervical   ? Chest pain     associated with asthma?   ? 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 Macario Carls in early 2000s   ? Chiari malformation     Dr. Danielle Dess did my surgery at [facility]   ? Chronic back pain 02/11/2009   ? Deep vein thrombosis (DVT) (HCC) 1996    right leg   ? Degenerative arthritis of spine    ? Depression     In the past   ? Fibromyalgia    ? Fibromyalgia    ?  Generalized headaches     migraines   ? Head injury    ? Heart murmur     Was noticed during a test   ? HTN    ? Joint pain     Arthritis   ? Menorrhagia    ? Migraines    ? Nausea    ? Other chest pain 07/09/2018   ? Overweight(278.02)    ? Pain     back, neck, legs   ? Pneumonia     In the past   ? Pulmonary embolism [facility])     My lags   ? Rapid palpitations 02/11/2009   ? Spinal headache     Chiar headache they start my neck and going to my head   ? Thyroid disorder        Surgical History:  Surgical History:   Procedure Laterality Date   ? HYSTERECTOMY  2005    uterus + 1 ovary removed   ? HX KNEE ARTHROSCOPY Left Jan 2016    and Dec 2015   ? TOTAL THYROIDECTOMY, POSSIBLE CENTRAL NECK LYMPH NODE DISSECTION, INTRAOPERATIVE RECURRENT LARYNGEAL NERVE MONITORING, INTRAOPERATIVE PARATHYROID HORMONE MONITORING, PARATHYROIDECTOMY N/A 08/06/2014    Performed by Marcelle Overlie, MD at [facility] OR   ? UPPER GASTROINTESTINAL ENDOSCOPY  11/03/2015   ? HX CHOLECYSTECTOMY  04/29/2017   ? HX CESAREAN SECTION     ? HX HYSTERECTOMY      [facility] did it   ? HX KNEE ARTHROSCOPY Right 1990's   ? HX SURGERY      neck area, chiari malformation   ? HX SURGERY      abdominal sx ? HX TUBAL LIGATION     ? KNEE SURGERY      Both knee have been scoped   ? OOPHORECTOMY      remaining ovary removed       Social History:   Social History     Tobacco Use   ? Smoking status: Former Smoker     Packs/day: 1.50     Years: 15.00     Pack years: 22.50     Types: Cigarettes     Quit date: 07/04/1994     Years since quitting: 26.1   ? Smokeless tobacco: Former Neurosurgeon     Types: Chew   Substance Use Topics   ? Alcohol use: Yes     Alcohol/week: 1.0 standard drink     Types: 1 Glasses of wine per week     Comment: Socially   ? 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 Cancer Father         My dad passed away of leukemia and lung cancer and another form of cancer   ? Heart Failure Father    ? Migraines Father    ? Alcohol liver disease Father         Was a heavy drinker throughout life. 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 Arthritis Father         Hands lagsand back   ? Back pain Father         Had arthritis in his back he had been paralyzed at one point also was in a motorcycle accident and  semi accident   ? Hypertension Father    ? Joint Pain Father    ? Arthritis-rheumatoid Other    ? Seizures Other    ? Mental Retardation Sister    ? Migraines Sister    ? Tremor Sister    ? Diabetes Sister    ? Stroke Maternal Grandfather    ? Stroke Paternal Grandmother    ? Cancer Paternal Grandmother         My grandmother had bone cancer cervical cancer   ? Heart Attack Paternal Grandmother    ? Heart problem Paternal Grandmother    ? Hypertension Paternal Grandmother    ? Joint Pain Paternal Grandmother    ? Osteoporosis Paternal Grandmother    ? Other Other         hyerectomies in age 69s or 71s in her sisters.    ? Heart problem Brother    ? Hypertension Brother    ? Joint Pain Brother    ? Osteoporosis Brother    ? Osteoporosis Sister Allergies:  Allergies   Allergen Reactions   ? Aspirin ANAPHYLAXIS   ? Ibuprofen ANAPHYLAXIS     Patient can tolerate 800 mg tablet   ? Pcn [Penicillins] ANAPHYLAXIS   ? Reglan [Metoclopramide] SEE COMMENTS     Severe leg shakiness   ? Adhesive Tape (Rosins) RASH   ? Sulfa (Sulfonamide Antibiotics) NAUSEA AND VOMITING   ? Omeprazole NAUSEA AND VOMITING   ? Prednisone NAUSEA AND VOMITING       Objective:         ? albuterol sulfate (PROAIR HFA) 90 mcg/actuation HFA aerosol inhaler Inhale two puffs by mouth into the lungs every 6 hours as needed for Wheezing or Shortness of Breath. Shake well before use.   ? budesonide/formoterol (SYMBICORT HFA) 80-4.5 mcg/actuation inhalation Inhale 2 puffs by mouth twice daily   ? EPINEPHrine(+) (EPIPEN) 1 mg/mL injection pen Inject 0.3 mg to area(s) as directed once as needed.   ? eszopiclone (LUNESTA) 2 mg tablet TAKE 1 TABLET BY MOUTH AT BEDTIME AS NEEDED FOR INSOMNIA   ? famotidine (PEPCID) 40 mg/5 mL (8 mg/mL) oral suspension Take  by mouth twice daily.   ? hydroCHLOROthiazide (HYDRODIURIL) 25 mg tablet Take one tablet by mouth every morning.   ? levothyroxine (SYNTHROID) 150 mcg tablet Take six days a week and (half a tablet) one day a week. Take 30 min before other food,drink,meds   ? phentermine (ADIPEX-P) 37.5 mg tablet Take 37.5 mg by mouth daily.   ? potassium chloride SR (K-DUR) 10 mEq tablet Take 30 mEq by mouth daily. Take with a meal and a full glass of water.    ? prazosin (MINIPRESS) 2 mg capsule TAKE 1 CAPSULE BY MOUTH ONCE DAILY AT BEDTIME AS NEEDED FOR NIGHTMARES   ? topiramate (TOPAMAX) 25 mg tablet Take 25 mg by mouth twice daily.     There were no vitals filed for this visit.  There is no height or weight on file to calculate BMI.     No data recorded  Is a controlled substance agreement on file? Not Applicable      Physical Exam    General: WG/WN/WD,?morbidly obese, AAOx4, NAD  HEENT: NC/AT  Cardiac: RRR without murmur  Speech: fluent, no dysarthria or aphasia  ?  CN: PERRL, EOMI without restriction or nystagmus, facial sensation symmetric (V1-V3) to LT bilaterally, No facial droop or ptosis, palatal rise symmetric, shoulder shrug symmetric, tongue midline  ?  Strength:   UEs:??????5/5 SA/EF/EE/WE/WF  LEs:??????5/5 HF/KE/DF/PF  No involuntary movements, no tremor  ?  Sensation: Pinprick with no sensory deficit or gradient today, vibration 11 seconds right great toe 5 seconds on the left  ?  DTRs: 2/2 in BR/B/P?1/1A, downgoing plantar reflexes bilaterally  ?  Coordination: normal bilateral FTN without dysmetria  Gait: Cautious, no ataxia of primary gait   Bilateral lower extremity subcutaneous edema  ?     Assessment and Plan:    1.  Bilateral foot dysesthesia-no definitive evidence for polyneuropathy  2.  Abnormal EMG-likely due to technical factors/peripheral edema  3.  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  No other neurologic work-up recommended  2.  Consider repeat EMG/NCS, but there is persistent lower extremity edema  3.  Neuromuscular subspecialist believed that this was a central process rather than peripheral.  She will follow-up with primary care provider.  If new neurological concerns or worsening happens, I am glad to see her again if needed.  In the meantime we discussed importance of fall prevention.  4.  Discharge from neurology clinic.  RTC as needed.  No indication for genetic testing at this point.  Doubt hereditary sensory autonomic neuropathy (HSAN) particularly with unrevealing neuromuscular subspecialty evaluation.  If suspicion increases and peripheral edema improves, we can always do repeat EMG at a later time

## 2020-08-19 ENCOUNTER — Encounter: Admit: 2020-08-19 | Discharge: 2020-08-19 | Payer: Medicaid Other

## 2020-08-21 ENCOUNTER — Encounter: Admit: 2020-08-21 | Discharge: 2020-08-21 | Payer: Medicaid Other

## 2020-08-22 ENCOUNTER — Encounter: Admit: 2020-08-22 | Discharge: 2020-08-22 | Payer: Medicaid Other

## 2020-10-15 ENCOUNTER — Encounter: Admit: 2020-10-15 | Discharge: 2020-10-15 | Payer: Medicaid Other

## 2020-10-15 MED ORDER — EUTHYROX 137 MCG PO TAB
ORAL_TABLET | Freq: Every day | 0 refills
Start: 2020-10-15 — End: ?

## 2020-10-17 ENCOUNTER — Encounter: Admit: 2020-10-17 | Discharge: 2020-10-17 | Payer: Medicaid Other

## 2020-10-17 DIAGNOSIS — E890 Postprocedural hypothyroidism: Secondary | ICD-10-CM

## 2020-10-18 ENCOUNTER — Encounter: Admit: 2020-10-18 | Discharge: 2020-10-18 | Payer: Medicaid Other

## 2020-11-11 ENCOUNTER — Encounter: Admit: 2020-11-11 | Discharge: 2020-11-11 | Payer: Medicaid Other

## 2020-11-11 DIAGNOSIS — E890 Postprocedural hypothyroidism: Secondary | ICD-10-CM

## 2020-11-11 LAB — FREE T4 (FREE THYROXINE) ONLY: FREE T4: 1.2 (ref 0.70–1.48)

## 2020-11-11 LAB — THYROID STIMULATING HORMONE-TSH: TSH: 0.7 (ref 0.35–4.94)

## 2020-11-14 ENCOUNTER — Encounter: Admit: 2020-11-14 | Discharge: 2020-11-14 | Payer: Medicaid Other

## 2020-11-14 MED ORDER — EUTHYROX 137 MCG PO TAB
ORAL_TABLET | Freq: Every day | 0 refills
Start: 2020-11-14 — End: ?

## 2020-11-14 MED ORDER — EUTHYROX 125 MCG PO TAB
ORAL_TABLET | Freq: Every day | 0 refills
Start: 2020-11-14 — End: ?

## 2020-11-23 ENCOUNTER — Encounter: Admit: 2020-11-23 | Discharge: 2020-11-23 | Payer: Medicaid Other

## 2020-11-23 MED ORDER — LEVOTHYROXINE 125 MCG PO TAB
125 ug | ORAL_TABLET | Freq: Every day | ORAL | 1 refills | 30.00000 days | Status: AC
Start: 2020-11-23 — End: ?

## 2020-11-28 ENCOUNTER — Encounter: Admit: 2020-11-28 | Discharge: 2020-11-28 | Payer: Medicaid Other

## 2020-11-29 ENCOUNTER — Encounter: Admit: 2020-11-29 | Discharge: 2020-11-29 | Payer: Medicaid Other

## 2020-12-07 IMAGING — MR Knee^Routine
7 of 8 series · 31 of 40 positions shown · non-contrast
Comparison: none

[Series 3: T2 fat-sat · axial · 4.0mm · 0.50mm/px · z∈[-93,+12]mm · 5 of 25 slices shown (1 of 3)]
[im 1/25]
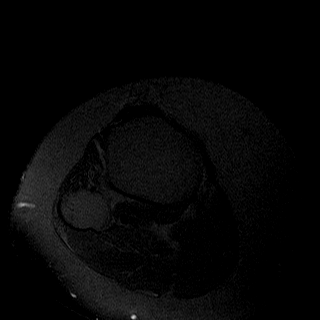
[im 7/25]
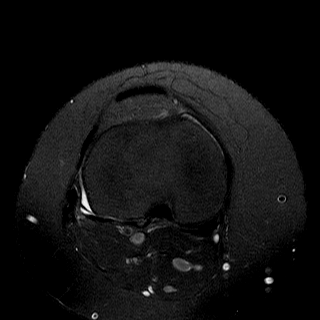
[im 13/25]
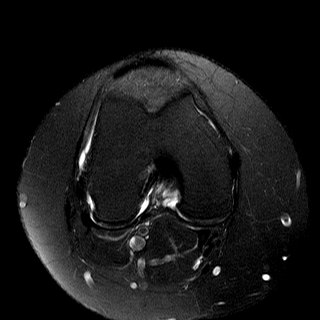
[im 19/25]
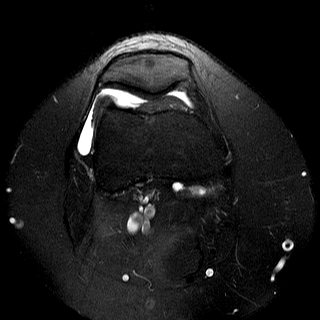
[im 25/25]
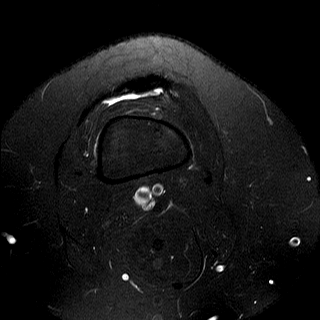

[Series 4: STIR · axial · 4.0mm · 0.29mm/px · z∈[-97,-0]mm · 5 of 23 slices shown (1 of 2)]
[im 1/23]
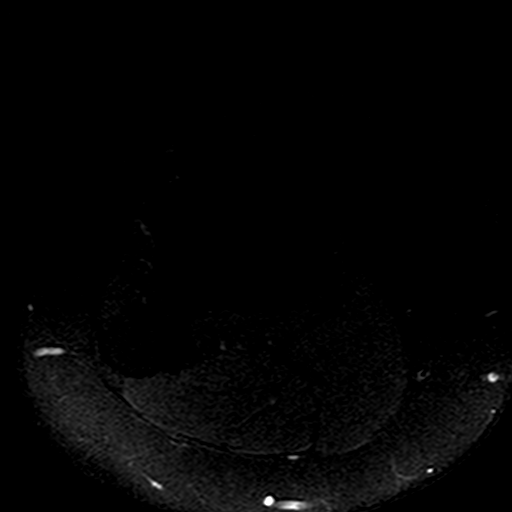
[im 6/23]
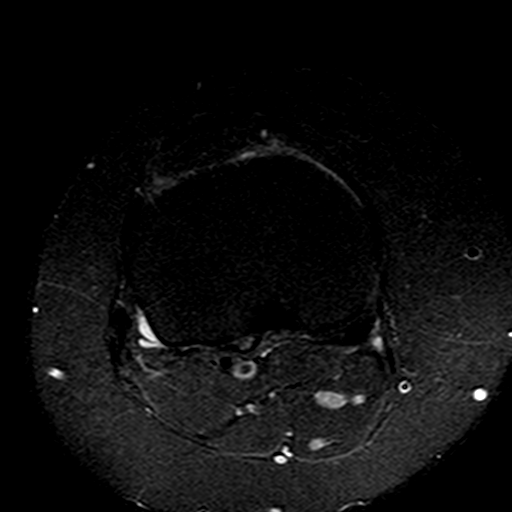
[im 12/23]
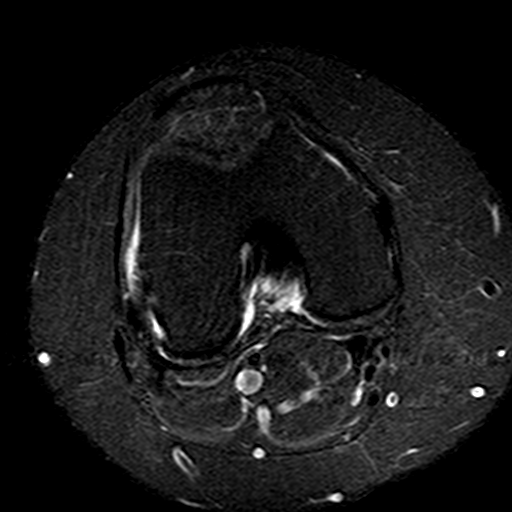
[im 17/23]
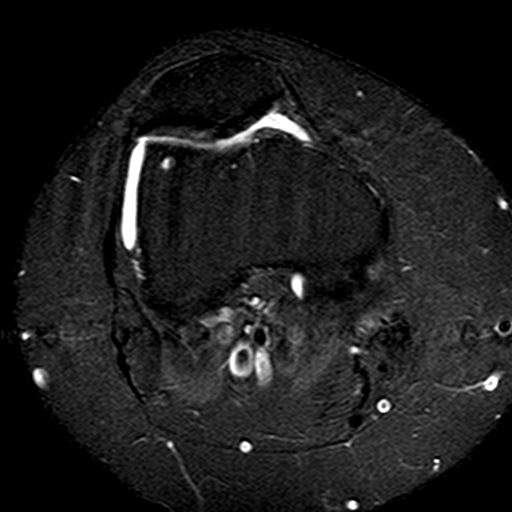
[im 23/23]
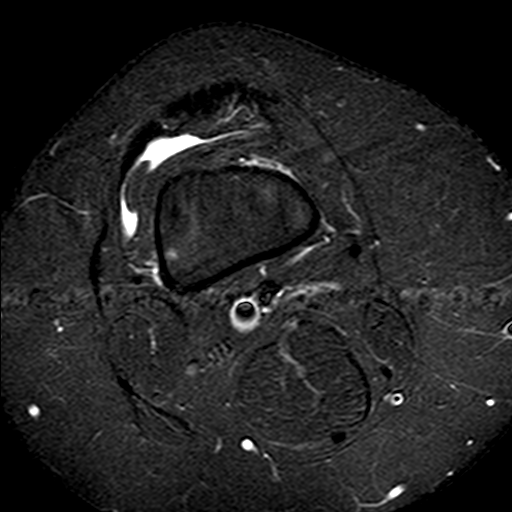

[Series 5: T1 · coronal · 4.0mm · 0.62mm/px · 5 of 25 slices shown]
[im 1/25]
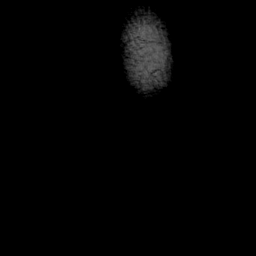
[im 7/25]
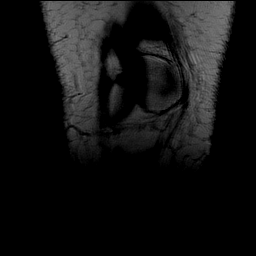
[im 13/25]
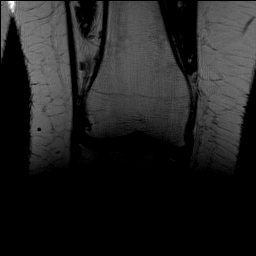
[im 19/25]
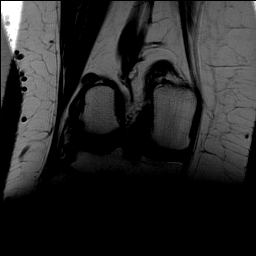
[im 25/25]
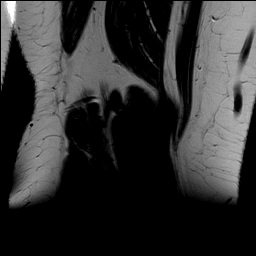

[Series 6: T2 fat-sat · coronal · 4.0mm · 0.50mm/px · 5 of 24 slices shown (2 of 3)]
[im 1/24]
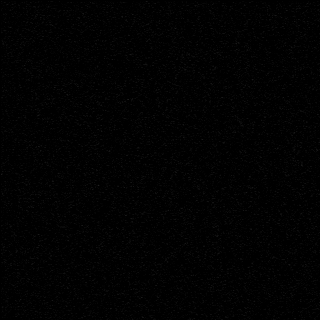
[im 6/24]
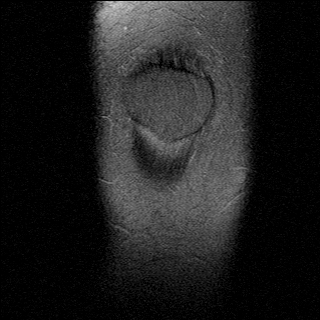
[im 12/24]
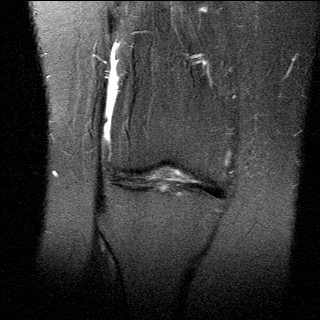
[im 18/24]
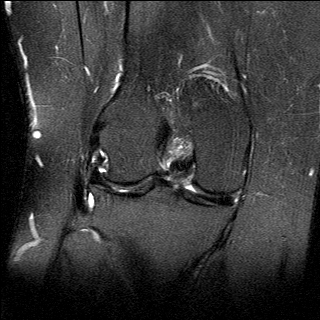
[im 24/24]
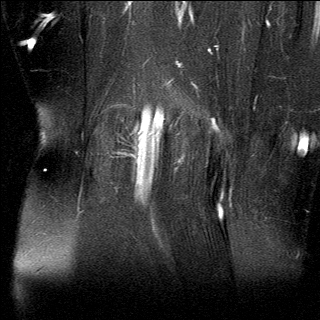

[Series 7: STIR · coronal · 4.0mm · 0.29mm/px · 1 of 24 slices shown (2 of 2)]
[im 1/24]
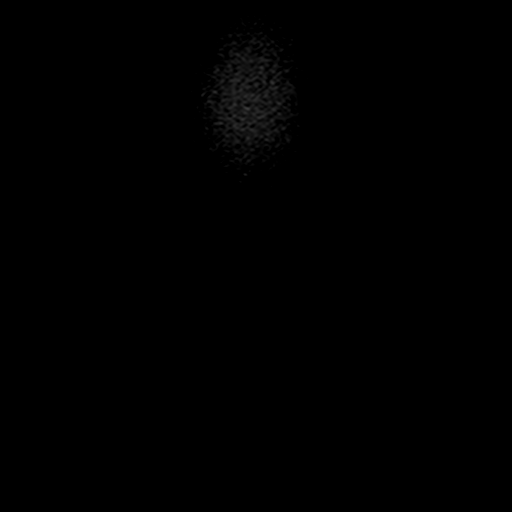

[Series 8: T2 fat-sat · sagittal · 4.0mm · 0.50mm/px · 5 of 25 slices shown (3 of 3)]
[im 1/25]
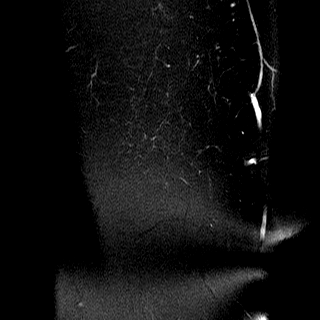
[im 7/25]
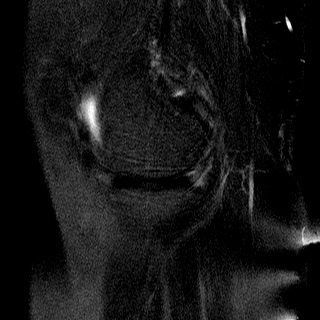
[im 13/25]
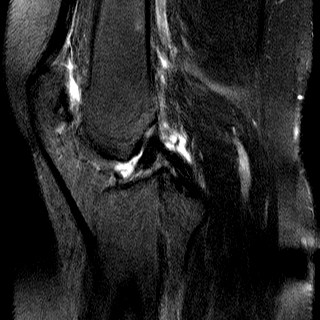
[im 19/25]
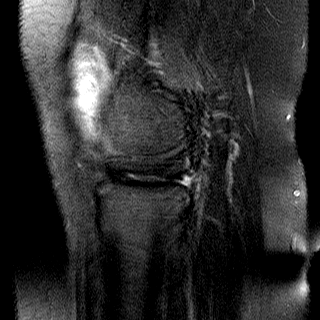
[im 25/25]
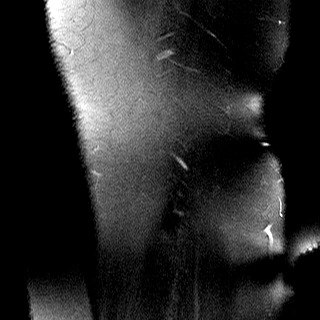

[Series 9: PD fat-sat · sagittal · 4.0mm · 0.29mm/px · 5 of 22 slices shown]
[im 1/22]
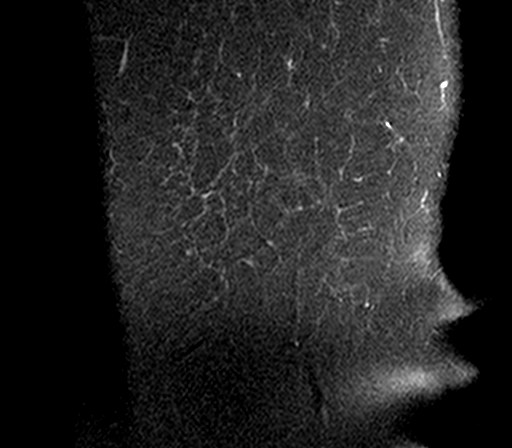
[im 6/22]
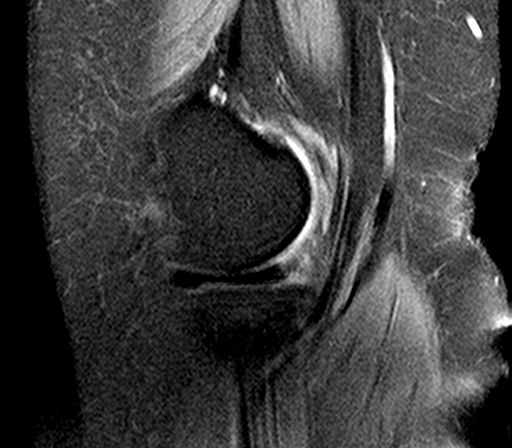
[im 11/22]
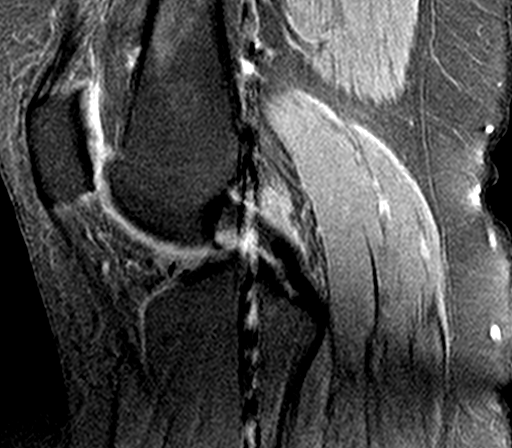
[im 16/22]
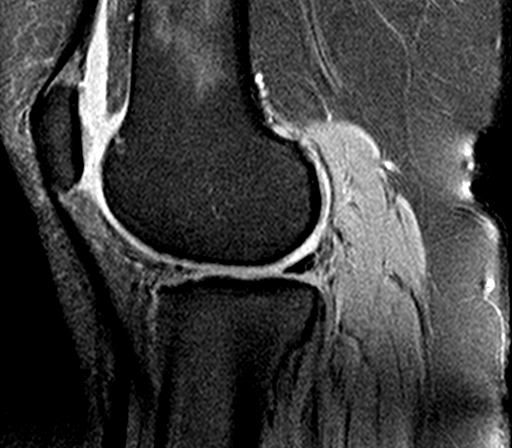
[im 22/22]
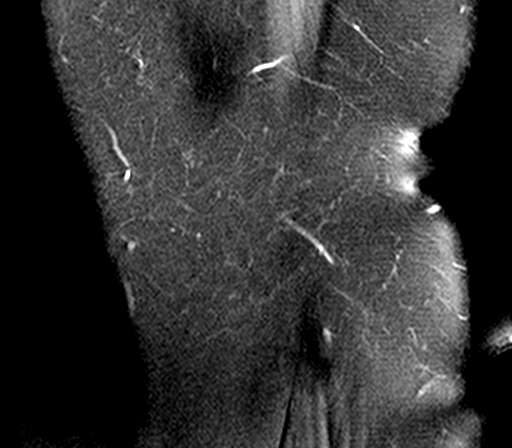

[31 of 40 positions shown; findings below may reference images not displayed]

DIAGNOSTIC STUDIES

EXAM

MAGNETIC RESONANCE IMAGING, right KNEE, ANY JOINT OF LOWER EXTREMITY; WITHOUT CONTRAST, CPT 31374

INDICATION

right knee pain
Knee pain with locking after slipping on the ice.  BG

TECHNIQUE

Multisequence, multiplanar MRI of the right knee was performed without contrast using standard
departmental protocol.

COMPARISONS

Right knee radiographs performed February 17, 2018.

FINDINGS

There is pulsation/motion artifact and limitation due to technical factors and patient body
habitus.

Menisci: There is a large complex tear of the lateral meniscal body with a dominant horizontal
plane extending to the peripheral red red zone as well as the femoral articular surface and free
edge best appreciated on coronal image 15 of series 7. The abnormal intra substance linear signal
that is vertically oriented extending through the posterior horn is likely extension of tearing and
also suspect extension anteriorly to at least the anterior horn body junction. No evidence for
medial meniscal tear.

Ligaments: The ACL, PCL, MCL, and lateral ligamentous complex are intact.

Extensor mechanism: The quadriceps tendon, patella, and patellar tendon are intact.

Muscles: Normal bulk and signal.

Cartilage:

Patellofemoral: Focal fissuring of patellar cartilage at the junction median ridge and lateral
facet. There is also central trochlear deep fissuring.

Medial compartment: Detail is limited from above factors. There is fissuring and at least partial
loss at the junction of the weight-bearing and posterior flexion condylar cartilage.

Lateral compartment: Partial loss and fissuring of weight-bearing tibial plateau cartilage.

Bone: Tricompartmental osteophytes are greatest in the lateral compartment. There is no acute
fracture or malalignment. Bone marrow signal is within normal limits.

Soft tissues: No soft tissue mass or fluid collection is identified. The neurovascular structures
are unremarkable.

IMPRESSION

1. Large lateral meniscal tear as described.

2. Mild partial articular cartilage loss and fissuring in all 3 compartments as above.

Tech Notes:

Knee pain with locking after slipping on the ice.
BG

## 2020-12-12 ENCOUNTER — Encounter: Admit: 2020-12-12 | Discharge: 2020-12-12 | Payer: Medicaid Other

## 2020-12-12 ENCOUNTER — Ambulatory Visit: Admit: 2020-12-12 | Discharge: 2020-12-12 | Payer: Medicaid Other

## 2020-12-12 DIAGNOSIS — H903 Sensorineural hearing loss, bilateral: Secondary | ICD-10-CM

## 2020-12-12 NOTE — Progress Notes
Tina [name] was seen today for a hearing aid consult.  She is interested in obtaining a hearing aid through her [employer] card.     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  Medical clearance for amplification was obtained 06/13/2020 by Dr. Horton Chin.    her insurance covers 1 hearing aid every 4 years.  OR    her qualifies for binaural hearing aids.     For binaural hearing aids, the patient must meet one of the following criteria:   Child under the age of 58    Legally Blind    Previous Binural user    Occupational requirement for binaural listening     She does not meet criteria for binaural amplification.      Resound Key 4 in black  for her left ear will be ordered. She would like a rechargeable hearing aid.    She is scheduled for a fitting on 02/03/2021.Marland Kitchen

## 2020-12-12 NOTE — Telephone Encounter
I called [employer] to check their hearing aid benefit coverage. I spoke with Moshe Salisbury at [facility]. The reference number for the call is [id number].     The patient's benefit is stated as follows:    What is the allowable benefit?  1 HA every 3 years, Patient eligible for 2nd HA , based off of medical necessity    How much of the allowable benefit has been used to date?   Is the benefit through a third party? no     Does the plan require a written recommendation from a physician? yes  Does the plan require prior authorization? yes

## 2020-12-14 ENCOUNTER — Encounter: Admit: 2020-12-14 | Discharge: 2020-12-14 | Payer: Medicaid Other

## 2020-12-29 ENCOUNTER — Ambulatory Visit: Admit: 2020-12-29 | Discharge: 2020-12-30 | Payer: Medicaid Other

## 2020-12-29 ENCOUNTER — Encounter: Admit: 2020-12-29 | Discharge: 2020-12-29 | Payer: Medicaid Other

## 2021-01-05 ENCOUNTER — Encounter: Admit: 2021-01-05 | Discharge: 2021-01-05 | Payer: Medicaid Other

## 2021-01-05 ENCOUNTER — Ambulatory Visit: Admit: 2021-01-05 | Discharge: 2021-01-05 | Payer: Medicaid Other

## 2021-01-05 DIAGNOSIS — M47815 Spondylosis without myelopathy or radiculopathy, thoracolumbar region: Secondary | ICD-10-CM

## 2021-01-05 DIAGNOSIS — M7918 Myalgia, other site: Secondary | ICD-10-CM

## 2021-01-05 NOTE — Telephone Encounter
Spoke to patient regarding status of hearing aids, currently at [facility] and hearing aid fitting scheduled 12.2.
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
## 2021-01-10 ENCOUNTER — Encounter: Admit: 2021-01-10 | Discharge: 2021-01-10 | Payer: Medicaid Other

## 2021-01-10 NOTE — Telephone Encounter
Received call from [facility], Phoebe Sharps, stating patient needs appointment [phone]) to document a brain injury or attestation form in order for patient to be on the brain injury waiver. Will forward to Dr. Bennie Pierini for her consideration.

## 2021-01-17 ENCOUNTER — Encounter: Admit: 2021-01-17 | Discharge: 2021-01-17 | Payer: Medicaid Other

## 2021-01-17 NOTE — Telephone Encounter
Faxed Lab order to [facility]  Also placed lab order in the mail to patient

## 2021-02-03 ENCOUNTER — Encounter: Admit: 2021-02-03 | Discharge: 2021-02-03 | Payer: Medicaid Other

## 2021-02-03 ENCOUNTER — Ambulatory Visit: Admit: 2021-02-03 | Discharge: 2021-02-03 | Payer: Medicaid Other

## 2021-02-03 NOTE — Progress Notes
Tina [name] was fit with Resound hearing aids today.    Settings were programmed via patient report for first time user using Resound's proprietary prescriptive targets.  Use and care for the aid was explained in detail as well as given in written form within the user's manual. She demonstrated that she was able to charge the battery. Tina [name] also demonstrated that she was able to insert and remove the aid with little to no difficulty.     Tina [name] will follow up prn.The aids will be billed to her insurance.    Manufacturer: Resound  Model: Key 4 [id number]  Serial Numbers: Right: [id number]  Left: [id number]  Receiver Length: Right: 2  Left: 0  Receiver Strength: MP  Repair Warranty: 01/15/2024  L&D Warranty: 01/15/2024  Battery: Rechargeable [id number]  Domes: Alfredia Ferguson  Programs: N/A  Volume: Lenn Sink Right- raise Left-lower  Fit Date: 02/03/2021  Purchase Type: [employer] Medicaid

## 2021-03-03 ENCOUNTER — Encounter: Admit: 2021-03-03 | Discharge: 2021-03-03 | Payer: Medicaid Other

## 2021-03-10 ENCOUNTER — Encounter: Admit: 2021-03-10 | Discharge: 2021-03-10 | Payer: Medicaid Other

## 2021-03-13 ENCOUNTER — Encounter: Admit: 2021-03-13 | Discharge: 2021-03-13 | Payer: Medicaid Other

## 2021-03-13 DIAGNOSIS — E039 Hypothyroidism, unspecified: Secondary | ICD-10-CM

## 2021-03-23 ENCOUNTER — Encounter: Admit: 2021-03-23 | Discharge: 2021-03-23 | Payer: Medicaid Other

## 2021-03-27 ENCOUNTER — Encounter: Admit: 2021-03-27 | Discharge: 2021-03-27 | Payer: Medicaid Other

## 2021-03-27 ENCOUNTER — Ambulatory Visit: Admit: 2021-03-27 | Discharge: 2021-03-27 | Payer: Medicaid Other

## 2021-03-27 DIAGNOSIS — S0990XA Unspecified injury of head, initial encounter: Secondary | ICD-10-CM

## 2021-03-27 DIAGNOSIS — I82409 Acute embolism and thrombosis of unspecified deep veins of unspecified lower extremity: Secondary | ICD-10-CM

## 2021-03-27 DIAGNOSIS — F32A Depression: Secondary | ICD-10-CM

## 2021-03-27 DIAGNOSIS — M2550 Pain in unspecified joint: Secondary | ICD-10-CM

## 2021-03-27 DIAGNOSIS — R002 Palpitations: Secondary | ICD-10-CM

## 2021-03-27 DIAGNOSIS — R110 Nausea: Secondary | ICD-10-CM

## 2021-03-27 DIAGNOSIS — R079 Chest pain, unspecified: Secondary | ICD-10-CM

## 2021-03-27 DIAGNOSIS — M549 Dorsalgia, unspecified: Secondary | ICD-10-CM

## 2021-03-27 DIAGNOSIS — R011 Cardiac murmur, unspecified: Secondary | ICD-10-CM

## 2021-03-27 DIAGNOSIS — H903 Sensorineural hearing loss, bilateral: Secondary | ICD-10-CM

## 2021-03-27 DIAGNOSIS — F909 Attention-deficit hyperactivity disorder, unspecified type: Secondary | ICD-10-CM

## 2021-03-27 DIAGNOSIS — R519 Generalized headaches: Secondary | ICD-10-CM

## 2021-03-27 DIAGNOSIS — M1990 Unspecified osteoarthritis, unspecified site: Secondary | ICD-10-CM

## 2021-03-27 DIAGNOSIS — J45909 Unspecified asthma, uncomplicated: Secondary | ICD-10-CM

## 2021-03-27 DIAGNOSIS — K219 Gastro-esophageal reflux disease without esophagitis: Secondary | ICD-10-CM

## 2021-03-27 DIAGNOSIS — M797 Fibromyalgia: Secondary | ICD-10-CM

## 2021-03-27 DIAGNOSIS — R52 Pain, unspecified: Secondary | ICD-10-CM

## 2021-03-27 DIAGNOSIS — E079 Disorder of thyroid, unspecified: Secondary | ICD-10-CM

## 2021-03-27 DIAGNOSIS — Q0700 Arnold-Chiari syndrome without spina bifida or hydrocephalus: Secondary | ICD-10-CM

## 2021-03-27 DIAGNOSIS — E663 Overweight: Secondary | ICD-10-CM

## 2021-03-27 DIAGNOSIS — M479 Spondylosis, unspecified: Secondary | ICD-10-CM

## 2021-03-27 DIAGNOSIS — G935 Compression of brain: Secondary | ICD-10-CM

## 2021-03-27 DIAGNOSIS — G43909 Migraine, unspecified, not intractable, without status migrainosus: Secondary | ICD-10-CM

## 2021-03-27 DIAGNOSIS — I2699 Other pulmonary embolism without acute cor pulmonale: Secondary | ICD-10-CM

## 2021-03-27 DIAGNOSIS — C801 Malignant (primary) neoplasm, unspecified: Secondary | ICD-10-CM

## 2021-03-27 DIAGNOSIS — N920 Excessive and frequent menstruation with regular cycle: Secondary | ICD-10-CM

## 2021-03-27 DIAGNOSIS — IMO0002 Chiari malformation: Secondary | ICD-10-CM

## 2021-03-27 DIAGNOSIS — G971 Other reaction to spinal and lumbar puncture: Secondary | ICD-10-CM

## 2021-03-27 DIAGNOSIS — R0789 Other chest pain: Secondary | ICD-10-CM

## 2021-03-27 DIAGNOSIS — F419 Anxiety disorder, unspecified: Secondary | ICD-10-CM

## 2021-03-27 DIAGNOSIS — J189 Pneumonia, unspecified organism: Secondary | ICD-10-CM

## 2021-03-27 NOTE — Progress Notes
Date of Service: 03/27/2021    Subjective:             Tina [name] is a 50 y.o. female.    History of Present Illness  Is here for follow-up on her hearing.  She has obtained hearing aids and is noted that she feels that she is hearing better.  People noted she does not seem to be talking as loud or having people repeat themselves as much.  No pain or drainage.  Has not noted any significant change in her hearing, worse or better.     Review of Systems   Constitutional: Negative.    HENT: Negative.    Eyes: Negative.    Respiratory: Negative.    Cardiovascular: Negative.    Gastrointestinal: Negative.    Endocrine: Negative.    Genitourinary: Negative.    Musculoskeletal: Negative.    Skin: Negative.    Allergic/Immunologic: Negative.    Neurological: Negative.    Hematological: Negative.    Psychiatric/Behavioral: Negative.          Objective:         ? albuterol sulfate (PROAIR HFA) 90 mcg/actuation HFA aerosol inhaler Inhale two puffs by mouth into the lungs every 6 hours as needed for Wheezing or Shortness of Breath. Shake well before use.   ? budesonide/formoterol (SYMBICORT HFA) 80-4.5 mcg/actuation inhalation Inhale 2 puffs by mouth twice daily   ? EPINEPHrine(+) (EPIPEN) 1 mg/mL injection pen Inject 0.3 mg to area(s) as directed once as needed.   ? escitalopram oxalate (LEXAPRO) 10 mg tablet Take 10 mg by mouth.   ? eszopiclone (LUNESTA) 3 mg tablet 3 mg.   ? famotidine (PEPCID) 40 mg/5 mL (8 mg/mL) oral suspension Take  by mouth twice daily.   ? hydroCHLOROthiazide (HYDRODIURIL) 25 mg tablet Take one tablet by mouth every morning.   ? levothyroxine (EUTHYROX) 125 mcg tablet Take one tablet by mouth daily 30 minutes before breakfast.   ? naltrexone-bupropion (CONTRAVE) 8-90 mg tablet    ? phentermine (ADIPEX-P) 37.5 mg tablet Take 37.5 mg by mouth daily.   ? potassium chloride SR (K-DUR) 10 mEq tablet Take 30 mEq by mouth daily. Take with a meal and a full glass of water.    ? prazosin (MINIPRESS) 2 mg capsule twice daily.   ? topiramate (TOPAMAX) 25 mg tablet Take 25 mg by mouth twice daily.     Vitals:    03/27/21 1049   BP: 135/86   Pulse: 71   PainSc: Zero   Weight: 103.4 kg (228 lb)   Height: 177.8 cm (5' 10)     Body mass index is 32.71 kg/m?Marland Kitchen     Physical Exam     Constitutional: She appears well-developed and well-nourished.   HENT:   Normal voice  Head: Normocephalic and atraumatic.   Right Ear: Tympanic membrane, external ear and ear canal normal. No drainage, swelling or tenderness. No middle ear effusion.   Left Ear: Tympanic membrane, external ear and ear canal normal. No drainage, swelling or tenderness.  No middle ear effusion.   Eyes: EOM and lids are normal.   Neck: Trachea normal, normal range of motion and phonation normal. No thyromegaly present.   No adenopathy.  Salivary Glands: normal  Cranial nerves: 2-12 normal      Testing:    Audio date 03/27/2021  Right 25/17 dB 100%  Left 27/17 dB 100%   Normal tympanometry.  There is a symmetric pattern of normal sloping to mild high-frequency  sensorineural hearing loss.  This is somewhat improved especially in the low tones bilaterally from previous exam.         Assessment and Plan:  This is a 51 year old with a fluctuating sensorineural hearing loss.  Today it is somewhat improved from previous exam.  I do not have a good reason for her fluctuating sensorineural loss other than possible her Chiari.  She is doing well with present amplification.  Follow-up 2 to 3 years when she starts noticing more difficulty.

## 2021-03-28 ENCOUNTER — Encounter: Admit: 2021-03-28 | Discharge: 2021-03-28 | Payer: Medicaid Other

## 2021-03-28 DIAGNOSIS — E039 Hypothyroidism, unspecified: Secondary | ICD-10-CM

## 2021-03-28 MED ORDER — LEVOTHYROXINE 125 MCG PO TAB
125 ug | ORAL_TABLET | Freq: Every day | ORAL | 3 refills | 30.00000 days | Status: AC
Start: 2021-03-28 — End: ?

## 2021-05-01 ENCOUNTER — Encounter: Admit: 2021-05-01 | Discharge: 2021-05-01 | Payer: Medicaid Other

## 2021-05-04 ENCOUNTER — Encounter: Admit: 2021-05-04 | Discharge: 2021-05-04 | Payer: Medicaid Other

## 2021-05-04 ENCOUNTER — Ambulatory Visit: Admit: 2021-05-04 | Discharge: 2021-05-04 | Payer: Medicaid Other

## 2021-05-04 NOTE — Progress Notes
Ms. Ruggiero was seen today for a hearing aid adjustment. She reports the hearing aid receivers are too long. She is aware that there is not a shorter receiver than size 0 for the left ear. Right receiver was replaced with a size 0. Sport locks were added to both devices. She reported hearing aids were more comfortable. She will follow-up prn.       Manufacturer: Resound  Model: Key 4 [id number]  Serial Numbers: Right: [id number]  Left: [id number]  Receiver Length: Right: 0  Left: 0  Receiver Strength: MP  Repair Warranty: 01/15/2024  L&D Warranty: 01/15/2024  Battery: Rechargeable [id number]  Domes: Alfredia Ferguson  Programs: N/A  Volume: Lenn Sink Right- raise Left-lower  Fit Date: 02/03/2021  Purchase Type: [employer]

## 2021-06-09 ENCOUNTER — Encounter: Admit: 2021-06-09 | Discharge: 2021-06-09 | Payer: Medicaid Other

## 2021-06-09 DIAGNOSIS — E039 Hypothyroidism, unspecified: Secondary | ICD-10-CM

## 2021-06-09 LAB — TSH WITH FREE T4 REFLEX: TSH: 1.3 m[IU]/mL (ref 0.35–4.94)

## 2021-06-21 ENCOUNTER — Encounter: Admit: 2021-06-21 | Discharge: 2021-06-21 | Payer: Medicaid Other

## 2021-06-21 ENCOUNTER — Ambulatory Visit: Admit: 2021-06-21 | Discharge: 2021-06-21 | Payer: Medicaid Other

## 2021-06-21 DIAGNOSIS — M797 Fibromyalgia: Secondary | ICD-10-CM

## 2021-06-21 DIAGNOSIS — G43909 Migraine, unspecified, not intractable, without status migrainosus: Secondary | ICD-10-CM

## 2021-06-21 DIAGNOSIS — R002 Palpitations: Secondary | ICD-10-CM

## 2021-06-21 DIAGNOSIS — I82409 Acute embolism and thrombosis of unspecified deep veins of unspecified lower extremity: Secondary | ICD-10-CM

## 2021-06-21 DIAGNOSIS — IMO0002 Chiari malformation: Secondary | ICD-10-CM

## 2021-06-21 DIAGNOSIS — R079 Chest pain, unspecified: Secondary | ICD-10-CM

## 2021-06-21 DIAGNOSIS — E663 Overweight: Secondary | ICD-10-CM

## 2021-06-21 DIAGNOSIS — M1990 Unspecified osteoarthritis, unspecified site: Secondary | ICD-10-CM

## 2021-06-21 DIAGNOSIS — Q0700 Arnold-Chiari syndrome without spina bifida or hydrocephalus: Secondary | ICD-10-CM

## 2021-06-21 DIAGNOSIS — M2550 Pain in unspecified joint: Secondary | ICD-10-CM

## 2021-06-21 DIAGNOSIS — R110 Nausea: Secondary | ICD-10-CM

## 2021-06-21 DIAGNOSIS — J45909 Unspecified asthma, uncomplicated: Secondary | ICD-10-CM

## 2021-06-21 DIAGNOSIS — R011 Cardiac murmur, unspecified: Secondary | ICD-10-CM

## 2021-06-21 DIAGNOSIS — R519 Generalized headaches: Secondary | ICD-10-CM

## 2021-06-21 DIAGNOSIS — I2699 Other pulmonary embolism without acute cor pulmonale: Secondary | ICD-10-CM

## 2021-06-21 DIAGNOSIS — J189 Pneumonia, unspecified organism: Secondary | ICD-10-CM

## 2021-06-21 DIAGNOSIS — S0990XA Unspecified injury of head, initial encounter: Secondary | ICD-10-CM

## 2021-06-21 DIAGNOSIS — G971 Other reaction to spinal and lumbar puncture: Secondary | ICD-10-CM

## 2021-06-21 DIAGNOSIS — F32A Depression: Secondary | ICD-10-CM

## 2021-06-21 DIAGNOSIS — F909 Attention-deficit hyperactivity disorder, unspecified type: Secondary | ICD-10-CM

## 2021-06-21 DIAGNOSIS — G935 Compression of brain: Secondary | ICD-10-CM

## 2021-06-21 DIAGNOSIS — M549 Dorsalgia, unspecified: Secondary | ICD-10-CM

## 2021-06-21 DIAGNOSIS — E890 Postprocedural hypothyroidism: Secondary | ICD-10-CM

## 2021-06-21 DIAGNOSIS — N920 Excessive and frequent menstruation with regular cycle: Secondary | ICD-10-CM

## 2021-06-21 DIAGNOSIS — C801 Malignant (primary) neoplasm, unspecified: Secondary | ICD-10-CM

## 2021-06-21 DIAGNOSIS — R52 Pain, unspecified: Secondary | ICD-10-CM

## 2021-06-21 DIAGNOSIS — E079 Disorder of thyroid, unspecified: Secondary | ICD-10-CM

## 2021-06-21 DIAGNOSIS — K219 Gastro-esophageal reflux disease without esophagitis: Secondary | ICD-10-CM

## 2021-06-21 DIAGNOSIS — R0789 Other chest pain: Secondary | ICD-10-CM

## 2021-06-21 DIAGNOSIS — M479 Spondylosis, unspecified: Secondary | ICD-10-CM

## 2021-06-21 DIAGNOSIS — F419 Anxiety disorder, unspecified: Secondary | ICD-10-CM

## 2021-06-21 NOTE — Patient Instructions
It was a pleasure seeing you in clinic today.    Rick Karely Hurtado RN, BSN  Clinical Nurse Coordinator  Physical Medicine & Rehab  Dr. Alexandra Arickx  [facility]  [facility]  [address]  [place], [place] [address]    Phone: [phone]  Fax [phone]  For scheduling, cancelling, or changing appointments [phone]    For prescription refills, please contact your pharmacy.  Please allow 2-3 days for refill requests to be filled.      For information on spinal conditions, please visit [email or web address]

## 2021-06-22 ENCOUNTER — Encounter: Admit: 2021-06-22 | Discharge: 2021-06-22 | Payer: Medicaid Other

## 2021-07-19 ENCOUNTER — Encounter: Admit: 2021-07-19 | Discharge: 2021-07-19 | Payer: Medicaid Other

## 2021-08-24 ENCOUNTER — Ambulatory Visit: Admit: 2021-08-24 | Discharge: 2021-08-24 | Payer: Medicaid Other

## 2021-08-24 ENCOUNTER — Encounter: Admit: 2021-08-24 | Discharge: 2021-08-24 | Payer: Medicaid Other

## 2021-08-24 DIAGNOSIS — M7918 Myalgia, other site: Secondary | ICD-10-CM

## 2021-08-24 DIAGNOSIS — N920 Excessive and frequent menstruation with regular cycle: Secondary | ICD-10-CM

## 2021-08-24 DIAGNOSIS — F32A Depression: Secondary | ICD-10-CM

## 2021-08-24 DIAGNOSIS — G971 Other reaction to spinal and lumbar puncture: Secondary | ICD-10-CM

## 2021-08-24 DIAGNOSIS — F419 Anxiety disorder, unspecified: Secondary | ICD-10-CM

## 2021-08-24 DIAGNOSIS — E079 Disorder of thyroid, unspecified: Secondary | ICD-10-CM

## 2021-08-24 DIAGNOSIS — R110 Nausea: Secondary | ICD-10-CM

## 2021-08-24 DIAGNOSIS — R079 Chest pain, unspecified: Secondary | ICD-10-CM

## 2021-08-24 DIAGNOSIS — M1990 Unspecified osteoarthritis, unspecified site: Secondary | ICD-10-CM

## 2021-08-24 DIAGNOSIS — R0789 Other chest pain: Secondary | ICD-10-CM

## 2021-08-24 DIAGNOSIS — M479 Spondylosis, unspecified: Secondary | ICD-10-CM

## 2021-08-24 DIAGNOSIS — R002 Palpitations: Secondary | ICD-10-CM

## 2021-08-24 DIAGNOSIS — IMO0002 Chiari malformation: Secondary | ICD-10-CM

## 2021-08-24 DIAGNOSIS — R52 Pain, unspecified: Secondary | ICD-10-CM

## 2021-08-24 DIAGNOSIS — G935 Compression of brain: Secondary | ICD-10-CM

## 2021-08-24 DIAGNOSIS — J45909 Unspecified asthma, uncomplicated: Secondary | ICD-10-CM

## 2021-08-24 DIAGNOSIS — R011 Cardiac murmur, unspecified: Secondary | ICD-10-CM

## 2021-08-24 DIAGNOSIS — G43909 Migraine, unspecified, not intractable, without status migrainosus: Secondary | ICD-10-CM

## 2021-08-24 DIAGNOSIS — I2699 Other pulmonary embolism without acute cor pulmonale: Secondary | ICD-10-CM

## 2021-08-24 DIAGNOSIS — Q0700 Arnold-Chiari syndrome without spina bifida or hydrocephalus: Secondary | ICD-10-CM

## 2021-08-24 DIAGNOSIS — C801 Malignant (primary) neoplasm, unspecified: Secondary | ICD-10-CM

## 2021-08-24 DIAGNOSIS — J189 Pneumonia, unspecified organism: Secondary | ICD-10-CM

## 2021-08-24 DIAGNOSIS — E663 Overweight: Secondary | ICD-10-CM

## 2021-08-24 DIAGNOSIS — K5732 Diverticulitis of large intestine without perforation or abscess without bleeding: Secondary | ICD-10-CM

## 2021-08-24 DIAGNOSIS — M2550 Pain in unspecified joint: Secondary | ICD-10-CM

## 2021-08-24 DIAGNOSIS — I82409 Acute embolism and thrombosis of unspecified deep veins of unspecified lower extremity: Secondary | ICD-10-CM

## 2021-08-24 DIAGNOSIS — M549 Dorsalgia, unspecified: Secondary | ICD-10-CM

## 2021-08-24 DIAGNOSIS — M797 Fibromyalgia: Secondary | ICD-10-CM

## 2021-08-24 DIAGNOSIS — F909 Attention-deficit hyperactivity disorder, unspecified type: Secondary | ICD-10-CM

## 2021-08-24 DIAGNOSIS — R519 Generalized headaches: Secondary | ICD-10-CM

## 2021-08-24 DIAGNOSIS — S0990XA Unspecified injury of head, initial encounter: Secondary | ICD-10-CM

## 2021-08-24 DIAGNOSIS — K219 Gastro-esophageal reflux disease without esophagitis: Secondary | ICD-10-CM

## 2021-09-05 ENCOUNTER — Encounter: Admit: 2021-09-05 | Discharge: 2021-09-06 | Payer: Medicaid Other

## 2021-09-06 ENCOUNTER — Encounter: Admit: 2021-09-06 | Discharge: 2021-09-07 | Payer: Medicaid Other

## 2021-09-07 ENCOUNTER — Ambulatory Visit: Admit: 2021-09-07 | Discharge: 2021-09-07 | Payer: Medicaid Other

## 2021-09-07 ENCOUNTER — Encounter: Admit: 2021-09-07 | Discharge: 2021-09-07 | Payer: Medicaid Other

## 2021-09-07 DIAGNOSIS — E669 Obesity, unspecified: Secondary | ICD-10-CM

## 2021-09-07 DIAGNOSIS — M7918 Myalgia, other site: Secondary | ICD-10-CM

## 2021-09-07 DIAGNOSIS — R233 Spontaneous ecchymoses: Secondary | ICD-10-CM

## 2021-09-07 NOTE — Patient Instructions
It was nice to see you today. Thank you for choosing to visit our clinic.      Your time is important and if you had to wait today, we do apologize. Our goal is to run exactly on time; however, on occasion, we get behind in clinic due to unexpected patient issues. Thank you for your patience.    General Instructions:  How to reach me: Please send a MyChart message to the Spine Center or leave a voicemail for my nurse, Cara ([phone].  How to get a medication refill: Please use the MyChart Refill request or contact your pharmacy directly to request medication refills. Please allow 72 business hours for request to be completed.  How to receive your test results: If you have signed up for MyChart, you will receive your test results and messages from me this way. Otherwise, you will get a phone call or letter. If you are expecting results and have not heard from my office within 2 weeks of your testing, please send a MyChart message or call my office.  Scheduling: Our scheduling phone number is [phone].  Appointment Reminders on your cell phone: Communication preferences can be managed in MyChart to ensure you receive important appointment notifications  Support for many chronic illnesses is available through [facility]: [facility] or [phone].  For questions on nights, weekends or holidays, call the Operator at [phone], and ask for the doctor on call for Anest[facility] Pain Management.      Again, thank you for coming in today.    ________________________________________________________________

## 2021-09-07 NOTE — Progress Notes
SPINE CENTER CLINIC NOTE       SUBJECTIVE: Tina [name] presents for care regarding back and neck pain.  Trigger point injections of reduced pain by about 50% for about 3 months at a time she does not wish to repeat them today.  She has been using Voltaren gel and notes that her pain is now controlled to a much improved degree.  Walking standing and turning her head to the side as when driving and use of computers not tolerated.  She continues to perform home exercise plan as prescribed by physical therapy.         Review of Systems    Current Outpatient Medications:   ?  duloxetine DR (CYMBALTA) 20 mg capsule, , Disp: , Rfl:   ?  EPINEPHrine(+) (EPIPEN) 1 mg/mL injection pen, Inject 0.3 mL into the muscle once as needed., Disp: , Rfl:   ?  eszopiclone (LUNESTA) 3 mg tablet, , Disp: , Rfl:   ?  levothyroxine (SYNTHROID) 125 mcg tablet, Take one tablet by mouth daily 30 minutes before breakfast., Disp: 90 tablet, Rfl: 3  ?  ondansetron (ZOFRAN ODT) 4 mg rapid dissolve tablet, , Disp: , Rfl:   ?  phentermine (ADIPEX-P) 37.5 mg tablet, Take one tablet by mouth daily., Disp: , Rfl:   ?  prazosin (MINIPRESS) 2 mg capsule, twice daily., Disp: , Rfl:   ?  topiramate (TOPAMAX) 50 mg tablet, Take one tablet by mouth twice daily., Disp: , Rfl:   ?  WEGOVY 0.5 mg/0.5 mL injector PEN, INJECT 1 DOSE SUBCUTANEOUSLY ONCE A WEEK, Disp: , Rfl:   Allergies   Allergen Reactions   ? Aspirin ANAPHYLAXIS   ? Ibuprofen ANAPHYLAXIS     Patient can tolerate 800 mg tablet   ? Pcn [Penicillins] ANAPHYLAXIS   ? Reglan [Metoclopramide] SEE COMMENTS     Severe leg shakiness   ? Adhesive Tape (Rosins) RASH   ? Sulfa (Sulfonamide Antibiotics) NAUSEA AND VOMITING   ? Omeprazole NAUSEA AND VOMITING   ? Prednisone NAUSEA AND VOMITING     Physical Exam  Vitals:    09/07/21 1438   BP: (P) 131/67   Pulse: (P) 79   SpO2: (P) 96%   PainSc: Two   Weight: 101.2 kg (223 lb)   Height: 165.1 cm (5' 5)     Oswestry Total Score:: 46  Pain Score: Two  Body mass index is 37.11 kg/m?Marland Kitchen  General: Alert and oriented, very pleasant female.   HEENT showed extraocular muscles were intact and no other abnormalities.  Unlabored breathing.  Regular rate and rhythm on CV exam.   5/5 strength in bilateral upper and lower extremities.    Sensation is intact to light touch and equal in the upper and lower extremities.  There is bilateral lumbar tenderness to palpation         IMPRESSION:  1. Myalgia, other site          PLAN: We will continue with Voltaren as needed and defer trigger point injections as pain control is improved.  Follow-up in 3 months or sooner if needed.

## 2021-09-11 ENCOUNTER — Ambulatory Visit: Admit: 2021-09-11 | Discharge: 2021-09-11 | Payer: Medicaid Other

## 2021-09-11 ENCOUNTER — Encounter: Admit: 2021-09-11 | Discharge: 2021-09-11 | Payer: Medicaid Other

## 2021-09-11 DIAGNOSIS — E669 Obesity, unspecified: Secondary | ICD-10-CM

## 2021-09-11 DIAGNOSIS — R233 Spontaneous ecchymoses: Secondary | ICD-10-CM

## 2021-09-11 NOTE — Progress Notes
Ms. Sirois was seen today for a hearing aid check. She reports she fell recently and her left hearing aid came out of her ear. She wants to make sure the hearing aid is not damaged. Routine cleaning and maintenance was performed. Sport locks were removed per patient request. Hearing aids were found to be in good working order. She will follow-up prn.     Manufacturer: Resound  Model: Key 4 [id number]  Serial Numbers: Right: [id number]  Left: [id number]  Receiver Length: Right: 0  Left: 0  Receiver Strength: MP  Repair Warranty: 01/15/2024  L&D Warranty: 01/15/2024  Battery: Rechargeable [id number]  Domes: Alfredia Ferguson  Programs: N/A  Volume: Lenn Sink Right- raise Left-lower  Fit Date: 02/03/2021  Purchase Type: [employer]

## 2021-09-13 DIAGNOSIS — R233 Spontaneous ecchymoses: Secondary | ICD-10-CM

## 2021-09-13 DIAGNOSIS — E669 Obesity, unspecified: Secondary | ICD-10-CM

## 2021-09-15 ENCOUNTER — Encounter: Admit: 2021-09-15 | Discharge: 2021-09-15 | Payer: Medicaid Other

## 2021-09-15 NOTE — Telephone Encounter
Received voicemail from patient stating someone tried to call her and she would like a return call  Returned call  No answer. LVM that office has not tried to call her today

## 2021-09-22 ENCOUNTER — Encounter: Admit: 2021-09-22 | Discharge: 2021-09-22 | Payer: Medicaid Other

## 2021-10-03 ENCOUNTER — Encounter: Admit: 2021-10-03 | Discharge: 2021-10-03 | Payer: Medicaid Other

## 2021-10-05 ENCOUNTER — Encounter: Admit: 2021-10-05 | Discharge: 2021-10-05 | Payer: Medicaid Other

## 2021-10-05 ENCOUNTER — Ambulatory Visit: Admit: 2021-10-05 | Discharge: 2021-10-06 | Payer: Medicaid Other

## 2021-10-05 DIAGNOSIS — R002 Palpitations: Secondary | ICD-10-CM

## 2021-10-05 DIAGNOSIS — M549 Dorsalgia, unspecified: Secondary | ICD-10-CM

## 2021-10-05 DIAGNOSIS — G43909 Migraine, unspecified, not intractable, without status migrainosus: Secondary | ICD-10-CM

## 2021-10-05 DIAGNOSIS — R0789 Other chest pain: Secondary | ICD-10-CM

## 2021-10-05 DIAGNOSIS — R52 Pain, unspecified: Secondary | ICD-10-CM

## 2021-10-05 DIAGNOSIS — M1990 Unspecified osteoarthritis, unspecified site: Secondary | ICD-10-CM

## 2021-10-05 DIAGNOSIS — F419 Anxiety disorder, unspecified: Secondary | ICD-10-CM

## 2021-10-05 DIAGNOSIS — M479 Spondylosis, unspecified: Secondary | ICD-10-CM

## 2021-10-05 DIAGNOSIS — K219 Gastro-esophageal reflux disease without esophagitis: Secondary | ICD-10-CM

## 2021-10-05 DIAGNOSIS — C801 Malignant (primary) neoplasm, unspecified: Secondary | ICD-10-CM

## 2021-10-05 DIAGNOSIS — F909 Attention-deficit hyperactivity disorder, unspecified type: Secondary | ICD-10-CM

## 2021-10-05 DIAGNOSIS — F32A Depression: Secondary | ICD-10-CM

## 2021-10-05 DIAGNOSIS — I2699 Other pulmonary embolism without acute cor pulmonale: Secondary | ICD-10-CM

## 2021-10-05 DIAGNOSIS — G971 Other reaction to spinal and lumbar puncture: Secondary | ICD-10-CM

## 2021-10-05 DIAGNOSIS — E079 Disorder of thyroid, unspecified: Secondary | ICD-10-CM

## 2021-10-05 DIAGNOSIS — G935 Compression of brain: Secondary | ICD-10-CM

## 2021-10-05 DIAGNOSIS — IMO0002 Chiari malformation: Secondary | ICD-10-CM

## 2021-10-05 DIAGNOSIS — R011 Cardiac murmur, unspecified: Secondary | ICD-10-CM

## 2021-10-05 DIAGNOSIS — J45909 Unspecified asthma, uncomplicated: Secondary | ICD-10-CM

## 2021-10-05 DIAGNOSIS — N920 Excessive and frequent menstruation with regular cycle: Secondary | ICD-10-CM

## 2021-10-05 DIAGNOSIS — M5134 Other intervertebral disc degeneration, thoracic region: Secondary | ICD-10-CM

## 2021-10-05 DIAGNOSIS — E663 Overweight: Secondary | ICD-10-CM

## 2021-10-05 DIAGNOSIS — M5030 Other cervical disc degeneration, unspecified cervical region: Secondary | ICD-10-CM

## 2021-10-05 DIAGNOSIS — M5136 Other intervertebral disc degeneration, lumbar region: Secondary | ICD-10-CM

## 2021-10-05 DIAGNOSIS — J189 Pneumonia, unspecified organism: Secondary | ICD-10-CM

## 2021-10-05 DIAGNOSIS — R110 Nausea: Secondary | ICD-10-CM

## 2021-10-05 DIAGNOSIS — S0990XA Unspecified injury of head, initial encounter: Secondary | ICD-10-CM

## 2021-10-05 DIAGNOSIS — M2550 Pain in unspecified joint: Secondary | ICD-10-CM

## 2021-10-05 DIAGNOSIS — Q0700 Arnold-Chiari syndrome without spina bifida or hydrocephalus: Secondary | ICD-10-CM

## 2021-10-05 DIAGNOSIS — R079 Chest pain, unspecified: Secondary | ICD-10-CM

## 2021-10-05 DIAGNOSIS — I82409 Acute embolism and thrombosis of unspecified deep veins of unspecified lower extremity: Secondary | ICD-10-CM

## 2021-10-05 DIAGNOSIS — M797 Fibromyalgia: Secondary | ICD-10-CM

## 2021-10-05 DIAGNOSIS — K5732 Diverticulitis of large intestine without perforation or abscess without bleeding: Secondary | ICD-10-CM

## 2021-10-05 DIAGNOSIS — R519 Generalized headaches: Secondary | ICD-10-CM

## 2021-10-05 NOTE — Progress Notes
Date of Service: 10/05/2021     Subjective:                 History of Present Illness    Her last visit was in June 2022 at which time I discharged her from the neurology clinic.  She had bilateral foot dysesthesia without definite evidence for polyneuropathy.      It was thought that she had central pain sensitization.  I referred her to a neuromuscular subspecialist who also thought it was a central process rather than peripheral. They thought abnormal EMG findings due to edema.   It was advised that she follow-up with her primary care provider but return if needed.    She had a normal skin biopsy and October 2021-no small fiber polyneuropathy.  EMG/NCS April 2021: Abnormal study suggestive of sensorimotor polyneuropathy.    She returns today because of ongoing numbness in the feet with tingling.  She has had leg cramps.  Balance is a bit steady and she has had a couple falls.    She complains of right shoulder pain.  She was sitting in the emergency department on Monday when a monitor fell and struck her on the shoulder.  She was not evaluated there and has not sought medical evaluation from PCP for it yet either.  She has pain when lifting the right arm.    She says she is here because her PCP wanted to check for neurological cause for tingling of the feet.  It has not spread elsewhere.  In fact, she says that it is better than it was when I saw her before.  It tends to involve the plantar aspects of the feet bilaterally but not elsewhere.    MRI cervical spine January 2022: Mild degenerative changes mid and lower C-spine.  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         Chief Complaint:  Chief Complaint   Patient presents with   ? Follow Up     Bilateral foot dysesthesia; new onset leg cramps       Past Medical History:  Medical History:   Diagnosis Date   ? Acid reflux    ? ADHD (attention deficit hyperactivity disorder)     Have had since I was a child   ? Anxiety     panic attacks   ? Arnold-Chiari malformation (HCC)    ? Arthritis    ? Asthma    ? Cancer [facility])     cervical   ? Chest pain     associated with asthma?   ? 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 Macario Carls in early 2000s   ? Chiari malformation     Dr. Danielle Dess did my surgery at [facility]   ? Chronic back pain 02/11/2009   ? Deep vein thrombosis (DVT) (HCC) 1996    right leg   ? Degenerative arthritis of spine    ? Degenerative disc disease, cervical    ? Degenerative disc disease, lumbar    ? Degenerative disc disease, thoracic    ? Depression     In the past   ? Diverticulitis of colon (without mention of hemorrhage)(562.11)     4 times   ? Fibromyalgia    ? Fibromyalgia    ? Generalized headaches     migraines   ? Head injury    ? Heart murmur     Was noticed during a test   ? HTN    ?  Joint pain     Arthritis   ? Menorrhagia    ? Migraines    ? Nausea    ? Other chest pain 07/09/2018   ? Overweight(278.02)    ? Pain     back, neck, legs   ? Pneumonia     In the past   ? Pulmonary embolism [facility])     My lags   ? Rapid palpitations 02/11/2009   ? Spinal headache     Chiar headache they start my neck and going to my head   ? Thyroid disorder        Surgical History:  Surgical History:   Procedure Laterality Date   ? HYSTERECTOMY  2005    uterus + 1 ovary removed   ? HX KNEE ARTHROSCOPY Left Jan 2016    and Dec 2015   ? TOTAL THYROIDECTOMY, POSSIBLE CENTRAL NECK LYMPH NODE DISSECTION, INTRAOPERATIVE RECURRENT LARYNGEAL NERVE MONITORING, INTRAOPERATIVE PARATHYROID HORMONE MONITORING, PARATHYROIDECTOMY N/A 08/06/2014    Performed by Marcelle Overlie, MD at [facility]   ? UPPER GASTROINTESTINAL ENDOSCOPY  11/03/2015   ? HX CHOLECYSTECTOMY  04/29/2017   ? HX CESAREAN SECTION     ? HX HYSTERECTOMY      [facility] did it   ? HX KNEE ARTHROSCOPY Right 1990's   ? HX SURGERY      neck area, chiari malformation   ? HX SURGERY      abdominal sx   ? HX TUBAL LIGATION     ? KNEE SURGERY      Both knee have been scoped   ? OOPHORECTOMY      remaining ovary removed       Social History:   Social History     Socioeconomic History   ? Marital status: Divorced   Tobacco Use   ? Smoking status: Former     Packs/day: 1.50     Years: 15.00     Pack years: 22.50     Types: Cigarettes     Quit date: 07/04/1994     Years since quitting: 27.2   ? Smokeless tobacco: Former     Types: Chew   Substance and Sexual Activity   ? Alcohol use: Yes     Alcohol/week: 1.0 standard drink of alcohol     Types: 1 Glasses of wine per week     Comment: Socially   ? 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 Sexual activity: Not Currently     Partners: Male     Birth control/protection: None             Family History:  Family History   Problem Relation Age of Onset   ? Cancer Father         My dad passed away of leukemi and Lung canceta and lung cancer and another form of cancer   ? Heart Failure Father    ? Migraines Father    ? Alcohol liver disease Father         Was a heavy drinker throughout life. 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 Arthritis Father         Hands lagsand back   ? Back pain Father         Had arthritis in his back he had been paralyzed at one point also was in a motorcycle accident  and semi accident   ? Hypertension Father    ? Joint Pain Father    ? Arthritis-rheumatoid Other    ? Seizures Other    ? Mental Retardation Sister    ? Migraines Sister    ? Tremor Sister    ? Diabetes Sister    ? Alcohol liver disease Sister         Was a have drinker   ? Stroke Maternal Grandfather    ? Arthritis Maternal Grandfather         Legs and hands back   ? Back pain Maternal Grandfather    ? Heart problem Maternal Grandfather    ? Hypertension Maternal Grandfather    ? Stroke Paternal Grandmother    ? Cancer Paternal Grandmother         My grandmother had bone cancer cervical cancer   ? Heart Attack Paternal Grandmother    ? Heart problem Paternal Grandmother    ? Hypertension Paternal Grandmother    ? Joint Pain Paternal Grandmother    ? Osteoporosis Paternal Grandmother    ? Arthritis Paternal Grandmother         Had and her legs back a hands   ? Back pain Paternal Grandmother    ? Bleeding Disorders Paternal Grandmother         Blood clots /   ? Other Other         hyerectomies in age 34s or 32s in her sisters.    ? Alcohol liver disease Mother         Severe alcoholic   ? Heart problem Brother    ? Hypertension Brother    ? Joint Pain Brother    ? Osteoporosis Brother    ? Osteoporosis Sister    ? Arthritis Sister         Hand   ? Cancer Sister         Cervical cancer and cervical cyst   ? Hypertension Sister    ? Back pain Daughter         Chiari   ? Neck Pain Daughter         Chair   ? Arthritis Maternal Grandmother    ? Cancer Maternal Grandmother         Breast Cancer   ? Osteoporosis Maternal Grandmother    ? Stroke Maternal Grandmother        Allergies:  Allergies   Allergen Reactions   ? Aspirin ANAPHYLAXIS   ? Ibuprofen ANAPHYLAXIS     Patient can tolerate 800 mg tablet   ? Pcn [Penicillins] ANAPHYLAXIS   ? Reglan [Metoclopramide] SEE COMMENTS     Severe leg shakiness   ? Adhesive Tape (Rosins) RASH   ? Sulfa (Sulfonamide Antibiotics) NAUSEA AND VOMITING   ? Omeprazole NAUSEA AND VOMITING   ? Prednisone NAUSEA AND VOMITING       Objective:         ? duloxetine DR (CYMBALTA) 20 mg capsule    ? EPINEPHrine(+) (EPIPEN) 1 mg/mL injection pen Inject 0.3 mL into the muscle once as needed.   ? eszopiclone (LUNESTA) 3 mg tablet    ? levothyroxine (SYNTHROID) 112 mcg tablet Take one tablet by mouth daily 30 minutes before breakfast.   ? ondansetron (ZOFRAN ODT) 4 mg rapid dissolve tablet    ? phentermine (ADIPEX-P) 37.5 mg tablet Take one tablet by mouth daily.   ? topiramate (TOPAMAX) 50 mg tablet Take two tablets by mouth  at bedtime daily.   ? WEGOVY 2.4 mg/0.75 mL injector PEN Inject 2.4 mg under the skin every 7 days.     Vitals:    10/05/21 1316   BP: (!) 138/90  Comment: denies CP,HA,dizziness or vision changes   Pulse: 84   Temp: 36.9 ?C (98.4 ?F)   SpO2: 96%   PainSc: Six   Weight: 91.6 kg (202 lb)   Height: 170.2 cm (5' 7)     Body mass index is 31.64 kg/m?Marland Kitchen       Physical Exam    General: WG/WN/WD,?obese, AAOx4, NAD  HEENT: NC/AT  Cardiac: RRR without murmur  Speech: fluent, no dysarthria or aphasia  ?  CN: PERRL, EOMI without restriction or nystagmus, facial sensation symmetric (V1-V3) to LT bilaterally, No facial droop or ptosis, palatal rise symmetric, shoulder shrug symmetric, tongue midline  ?  Strength:   UEs:??????4/5 SA/EF/EE 5/5WE/WF (pain limited on the right)  LEs:??????5/5 HF/KE/DF/PF  No involuntary movements, no tremor  ?  Sensation:?PP no identified gradient of lower extremities, vibration 12 seconds right great toe 8 seconds left great toe   ?  DTRs: 2/2 in BR/B/P?1/1A, downgoing plantar reflexes bilaterally  Gait: normal primary base and station, no ataxia       Assessment and Plan:    1.  Bilateral foot dysesthesia  2.  Prior abnormal EMG-possible due to edema/technical with normal skin biopsy/ENFD  3.  Right arm pain post injury  4.  Possible central pain sensitivity    Plan:  1.  Her exam is unreliable and symptoms nonspecific.  EMG abnormal before but neuromuscular thought due to pedal edema.  Skin biopsy was negative for polyneuropathy.  2.  Refer back to the neuromuscular clinic for updated opinion on possible polyneuropathy.  They may choose to repeat EMG or biopsy.  Since there is a question of discrepancy after last EMG, I think it is best that neuromuscular clinic sort this out as to whether polyneuropathy present or not.  Discharge from general neurology clinic with plan to be seen again in neuromuscular.  3.  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    Total Time Today was 30 minutes in the following activities: Obtaining and/or reviewing separately obtained history, Performing a medically appropriate examination and/or evaluation, Counseling and educating the patient/family/caregiver, Ordering medications, tests, or procedures and Documenting clinical information in the electronic or other health record

## 2021-10-05 NOTE — Progress Notes
Tina [name] dropped off her hearing aids at the [facility] ENT front desk.  Hearing aids were cleaned and in good working condition.  [name] was notified via phone and MyChart that her aids are ready for pick up at her convenience.     Manufacturer: Resound  Model: Key 4 [id number]  Serial Numbers: Right: [id number]  Left: [id number]  Receiver Length: Right: 0  Left: 0  Receiver Strength: MP  Repair Warranty: 01/15/2024  L&D Warranty: 01/15/2024  Battery: Rechargeable FG:[id number]  Domes: Alfredia Ferguson  Programs: N/A  Volume: Lenn Sink Right- raise Left-lower  Fit Date: 02/03/2021  Purchase Type: [employer] Medicaid

## 2021-10-06 DIAGNOSIS — R200 Anesthesia of skin: Secondary | ICD-10-CM

## 2021-10-16 ENCOUNTER — Encounter: Admit: 2021-10-16 | Discharge: 2021-10-16 | Payer: Medicaid Other

## 2021-11-17 ENCOUNTER — Encounter: Admit: 2021-11-17 | Discharge: 2021-11-17 | Payer: Medicaid Other

## 2021-11-21 ENCOUNTER — Encounter: Admit: 2021-11-21 | Discharge: 2021-11-21 | Payer: Medicaid Other

## 2021-11-21 DIAGNOSIS — R7989 Other specified abnormal findings of blood chemistry: Secondary | ICD-10-CM

## 2021-11-21 DIAGNOSIS — E890 Postprocedural hypothyroidism: Secondary | ICD-10-CM

## 2021-11-21 LAB — TSH WITH FREE T4 REFLEX: TSH: 0.4 m[IU]/mL (ref 0.35–4.94)

## 2021-11-27 ENCOUNTER — Encounter: Admit: 2021-11-27 | Discharge: 2021-11-27 | Payer: Medicaid Other

## 2021-11-27 DIAGNOSIS — R7989 Other specified abnormal findings of blood chemistry: Secondary | ICD-10-CM

## 2021-11-27 NOTE — Progress Notes
Need to confirm took dex first before AM cortisol. Also no dexamethasone level. Testing seems to have been done after 11am. May need to repeat .

## 2021-12-01 ENCOUNTER — Encounter: Admit: 2021-12-01 | Discharge: 2021-12-01 | Payer: Medicaid Other

## 2021-12-01 DIAGNOSIS — R7989 Other specified abnormal findings of blood chemistry: Secondary | ICD-10-CM

## 2021-12-01 DIAGNOSIS — R635 Abnormal weight gain: Secondary | ICD-10-CM

## 2021-12-01 LAB — CORTISOL,SALIVA: CORTISOL, SALIVA SAMPLE 1: 0

## 2021-12-01 NOTE — Telephone Encounter
Dr Sheppard Coil please see the salivary cortisol scanned in this afternoon

## 2021-12-01 NOTE — Telephone Encounter
Called Patient she states the lab she went to had her take a pill before bed and do the salivary test in the morning.

## 2021-12-07 ENCOUNTER — Encounter: Admit: 2021-12-07 | Discharge: 2021-12-07 | Payer: Medicaid Other

## 2021-12-18 ENCOUNTER — Encounter: Admit: 2021-12-18 | Discharge: 2021-12-18 | Payer: Medicaid Other

## 2021-12-18 ENCOUNTER — Ambulatory Visit: Admit: 2021-12-18 | Discharge: 2021-12-18 | Payer: Medicaid Other

## 2021-12-18 DIAGNOSIS — G935 Compression of brain: Secondary | ICD-10-CM

## 2021-12-18 DIAGNOSIS — M5134 Other intervertebral disc degeneration, thoracic region: Secondary | ICD-10-CM

## 2021-12-18 DIAGNOSIS — K5732 Diverticulitis of large intestine without perforation or abscess without bleeding: Secondary | ICD-10-CM

## 2021-12-18 DIAGNOSIS — S0990XA Unspecified injury of head, initial encounter: Secondary | ICD-10-CM

## 2021-12-18 DIAGNOSIS — M5412 Radiculopathy, cervical region: Secondary | ICD-10-CM

## 2021-12-18 DIAGNOSIS — G971 Other reaction to spinal and lumbar puncture: Secondary | ICD-10-CM

## 2021-12-18 DIAGNOSIS — R52 Pain, unspecified: Secondary | ICD-10-CM

## 2021-12-18 DIAGNOSIS — R002 Palpitations: Secondary | ICD-10-CM

## 2021-12-18 DIAGNOSIS — M5136 Other intervertebral disc degeneration, lumbar region: Secondary | ICD-10-CM

## 2021-12-18 DIAGNOSIS — R110 Nausea: Secondary | ICD-10-CM

## 2021-12-18 DIAGNOSIS — N920 Excessive and frequent menstruation with regular cycle: Secondary | ICD-10-CM

## 2021-12-18 DIAGNOSIS — IMO0002 Chiari malformation: Secondary | ICD-10-CM

## 2021-12-18 DIAGNOSIS — R011 Cardiac murmur, unspecified: Secondary | ICD-10-CM

## 2021-12-18 DIAGNOSIS — G43909 Migraine, unspecified, not intractable, without status migrainosus: Secondary | ICD-10-CM

## 2021-12-18 DIAGNOSIS — C801 Malignant (primary) neoplasm, unspecified: Secondary | ICD-10-CM

## 2021-12-18 DIAGNOSIS — F32A Depression: Secondary | ICD-10-CM

## 2021-12-18 DIAGNOSIS — J45909 Unspecified asthma, uncomplicated: Secondary | ICD-10-CM

## 2021-12-18 DIAGNOSIS — F909 Attention-deficit hyperactivity disorder, unspecified type: Secondary | ICD-10-CM

## 2021-12-18 DIAGNOSIS — R079 Chest pain, unspecified: Secondary | ICD-10-CM

## 2021-12-18 DIAGNOSIS — K219 Gastro-esophageal reflux disease without esophagitis: Secondary | ICD-10-CM

## 2021-12-18 DIAGNOSIS — M479 Spondylosis, unspecified: Secondary | ICD-10-CM

## 2021-12-18 DIAGNOSIS — M1990 Unspecified osteoarthritis, unspecified site: Secondary | ICD-10-CM

## 2021-12-18 DIAGNOSIS — I82409 Acute embolism and thrombosis of unspecified deep veins of unspecified lower extremity: Secondary | ICD-10-CM

## 2021-12-18 DIAGNOSIS — E663 Overweight: Secondary | ICD-10-CM

## 2021-12-18 DIAGNOSIS — R0789 Other chest pain: Secondary | ICD-10-CM

## 2021-12-18 DIAGNOSIS — F419 Anxiety disorder, unspecified: Secondary | ICD-10-CM

## 2021-12-18 DIAGNOSIS — M7918 Myalgia, other site: Secondary | ICD-10-CM

## 2021-12-18 DIAGNOSIS — Q0700 Arnold-Chiari syndrome without spina bifida or hydrocephalus: Secondary | ICD-10-CM

## 2021-12-18 DIAGNOSIS — R519 Generalized headaches: Secondary | ICD-10-CM

## 2021-12-18 DIAGNOSIS — E079 Disorder of thyroid, unspecified: Secondary | ICD-10-CM

## 2021-12-18 DIAGNOSIS — M549 Dorsalgia, unspecified: Secondary | ICD-10-CM

## 2021-12-18 DIAGNOSIS — M2550 Pain in unspecified joint: Secondary | ICD-10-CM

## 2021-12-18 DIAGNOSIS — J189 Pneumonia, unspecified organism: Secondary | ICD-10-CM

## 2021-12-18 DIAGNOSIS — M5030 Other cervical disc degeneration, unspecified cervical region: Secondary | ICD-10-CM

## 2021-12-18 DIAGNOSIS — I2699 Other pulmonary embolism without acute cor pulmonale: Secondary | ICD-10-CM

## 2021-12-18 DIAGNOSIS — M797 Fibromyalgia: Secondary | ICD-10-CM
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
## 2021-12-18 NOTE — Patient Instructions
It was nice to see you today. Thank you for choosing to visit our clinic.      Your time is important and if you had to wait today, we do apologize. Our goal is to run exactly on time; however, on occasion, we get behind in clinic due to unexpected patient issues. Thank you for your patience.    General Instructions:  How to reach me: Please send a MyChart message to the Spine Center or leave a voicemail for my nurse, Cara ([phone].  How to get a medication refill: Please use the MyChart Refill request or contact your pharmacy directly to request medication refills. Please allow 72 business hours for request to be completed.  How to receive your test results: If you have signed up for MyChart, you will receive your test results and messages from me this way. Otherwise, you will get a phone call or letter. If you are expecting results and have not heard from my office within 2 weeks of your testing, please send a MyChart message or call my office.  Scheduling: Our scheduling phone number is [phone].  Appointment Reminders on your cell phone: Communication preferences can be managed in MyChart to ensure you receive important appointment notifications  Support for many chronic illnesses is available through [facility]: [facility] or [phone].  For questions on nights, weekends or holidays, call the Operator at [phone], and ask for the doctor on call for Anest[facility] Pain Management.      Again, thank you for coming in today.    ________________________________________________________________

## 2021-12-19 ENCOUNTER — Encounter: Admit: 2021-12-19 | Discharge: 2021-12-19 | Payer: Medicaid Other

## 2021-12-26 ENCOUNTER — Encounter: Admit: 2021-12-26 | Discharge: 2021-12-26 | Payer: Medicaid Other

## 2022-01-03 ENCOUNTER — Encounter: Admit: 2022-01-03 | Discharge: 2022-01-03 | Payer: Medicaid Other

## 2022-01-03 MED ORDER — LEVOTHYROXINE 112 MCG PO TAB
112 ug | ORAL_TABLET | Freq: Every day | ORAL | 0 refills | 30.00000 days | Status: AC
Start: 2022-01-03 — End: ?

## 2022-01-03 NOTE — Telephone Encounter
Received fax requesting 90 day supply of levothyroxine  Prescription sent per protocol

## 2022-01-11 ENCOUNTER — Encounter: Admit: 2022-01-11 | Discharge: 2022-02-12 | Payer: Medicaid Other

## 2022-01-11 ENCOUNTER — Ambulatory Visit: Admit: 2022-01-11 | Discharge: 2022-02-10 | Payer: Medicaid Other

## 2022-01-12 ENCOUNTER — Encounter: Admit: 2022-01-12 | Discharge: 2022-01-12 | Payer: Medicaid Other

## 2022-01-12 ENCOUNTER — Ambulatory Visit: Admit: 2022-01-12 | Discharge: 2022-01-12 | Payer: Medicaid Other

## 2022-01-12 DIAGNOSIS — M7918 Myalgia, other site: Secondary | ICD-10-CM

## 2022-01-12 DIAGNOSIS — R635 Abnormal weight gain: Secondary | ICD-10-CM

## 2022-01-12 DIAGNOSIS — M5412 Radiculopathy, cervical region: Secondary | ICD-10-CM

## 2022-01-16 ENCOUNTER — Encounter: Admit: 2022-01-16 | Discharge: 2022-01-16 | Payer: Medicaid Other

## 2022-01-18 ENCOUNTER — Encounter: Admit: 2022-01-18 | Discharge: 2022-01-18 | Payer: Medicaid Other

## 2022-01-18 ENCOUNTER — Ambulatory Visit: Admit: 2022-01-18 | Discharge: 2022-01-19 | Payer: Medicaid Other

## 2022-01-18 DIAGNOSIS — M5416 Radiculopathy, lumbar region: Secondary | ICD-10-CM

## 2022-01-18 NOTE — Progress Notes
SPINE CENTER CLINIC NOTE        Obtained patient's verbal consent to treat them and their agreement to [facility] financial policy and NPP via this telehealth visit during the Coronavirus Public Health Emergency    SUBJECTIVE: Ms. Tina [name] participated in a Zoom video telehealth conference for ongoing care regarding neck and lumbar pain.  Her pain is bilateral and intermittently sharp and dull.  Cervical spine MRI was obtained and revealed mild disc bulge at C4-5 C5-6 and C6-7 with mild facet arthropathy as well.  Her main complaint is neck pain which is nonradiating and low back pain spreading to the bilateral posterior thighs.  Walking and standing exacerbates pain.  Pain is improved with rest.  Trigger point injections of reduced pain by about 50% for about 3 months at a time in the past and she wishes to have them repeated.         Review of Systems    Current Outpatient Medications:   ?  duloxetine DR (CYMBALTA) 20 mg capsule, , Disp: , Rfl:   ?  EPINEPHrine(+) (EPIPEN) 1 mg/mL injection pen, Inject 0.3 mL into the muscle once as needed., Disp: , Rfl:   ?  eszopiclone (LUNESTA) 3 mg tablet, , Disp: , Rfl:   ?  levothyroxine (SYNTHROID) 112 mcg tablet, Take one tablet by mouth daily 30 minutes before breakfast., Disp: 90 tablet, Rfl: 0  ?  ondansetron (ZOFRAN ODT) 4 mg rapid dissolve tablet, , Disp: , Rfl:   ?  phentermine (ADIPEX-P) 37.5 mg tablet, Take one tablet by mouth daily., Disp: , Rfl:   ?  topiramate (TOPAMAX) 50 mg tablet, Take two tablets by mouth at bedtime daily., Disp: , Rfl:   ?  WEGOVY 2.4 mg/0.75 mL injector PEN, Inject 2.4 mg under the skin every 7 days., Disp: , Rfl:   Allergies   Allergen Reactions   ? Aspirin ANAPHYLAXIS   ? Ibuprofen ANAPHYLAXIS     Patient can tolerate 800 mg tablet   ? Pcn [Penicillins] ANAPHYLAXIS   ? Reglan [Metoclopramide] SEE COMMENTS     Severe leg shakiness   ? Adhesive Tape (Rosins) RASH   ? Sulfa (Sulfonamide Antibiotics) NAUSEA AND VOMITING   ? Omeprazole NAUSEA AND VOMITING   ? Prednisone NAUSEA AND VOMITING     Physical Exam  Vitals:    01/18/22 1435   PainSc: Seven     Oswestry Total Score:: (P) 50  Pain Score: Seven  There is no height or weight on file to calculate BMI.  General: Alert and oriented, very pleasant female.   HEENT showed extraocular muscles were intact and no other abnormalities.  Unlabored breathing.  Regular rate and rhythm on CV exam.   5/5 strength in bilateral upper and lower extremities.    Sensation is intact to light touch and equal in the upper and lower extremities.         IMPRESSION:  1. Lumbar radiculopathy          PLAN: We will schedule a trigger point injection in clinic in the neck and low back regions and will obtain an MRI of the lumbar spine to help direct care for worsening back and leg pain.    Visit Start Time 15:02 Visit End Time 15:07.

## 2022-01-31 ENCOUNTER — Encounter: Admit: 2022-01-31 | Discharge: 2022-01-31 | Payer: Medicaid Other

## 2022-02-02 ENCOUNTER — Encounter: Admit: 2022-02-02 | Discharge: 2022-02-02 | Payer: Medicaid Other

## 2022-02-02 DIAGNOSIS — R7989 Other specified abnormal findings of blood chemistry: Secondary | ICD-10-CM

## 2022-02-02 MED ORDER — DEXAMETHASONE 1 MG PO TAB
ORAL_TABLET | 0 refills
Start: 2022-02-02 — End: ?

## 2022-02-05 ENCOUNTER — Encounter: Admit: 2022-02-05 | Discharge: 2022-02-05 | Payer: Medicaid Other

## 2022-02-05 NOTE — Telephone Encounter
RN received outside lab TSH results  Per T Harrel Carina, At goal continue Levo 112 mcg tablet  RN called pt to inform-pt voiced understanding  RN sent original lab to scan

## 2022-02-07 ENCOUNTER — Encounter: Admit: 2022-02-07 | Discharge: 2022-02-07 | Payer: Medicaid Other

## 2022-02-07 ENCOUNTER — Ambulatory Visit: Admit: 2022-02-07 | Discharge: 2022-02-08 | Payer: Medicaid Other

## 2022-02-07 DIAGNOSIS — E663 Overweight: Secondary | ICD-10-CM

## 2022-02-07 DIAGNOSIS — K5732 Diverticulitis of large intestine without perforation or abscess without bleeding: Secondary | ICD-10-CM

## 2022-02-07 DIAGNOSIS — C801 Malignant (primary) neoplasm, unspecified: Secondary | ICD-10-CM

## 2022-02-07 DIAGNOSIS — M5134 Other intervertebral disc degeneration, thoracic region: Secondary | ICD-10-CM

## 2022-02-07 DIAGNOSIS — K219 Gastro-esophageal reflux disease without esophagitis: Secondary | ICD-10-CM

## 2022-02-07 DIAGNOSIS — R52 Pain, unspecified: Secondary | ICD-10-CM

## 2022-02-07 DIAGNOSIS — G971 Other reaction to spinal and lumbar puncture: Secondary | ICD-10-CM

## 2022-02-07 DIAGNOSIS — R079 Chest pain, unspecified: Secondary | ICD-10-CM

## 2022-02-07 DIAGNOSIS — J45909 Unspecified asthma, uncomplicated: Secondary | ICD-10-CM

## 2022-02-07 DIAGNOSIS — Q0700 Arnold-Chiari syndrome without spina bifida or hydrocephalus: Secondary | ICD-10-CM

## 2022-02-07 DIAGNOSIS — F909 Attention-deficit hyperactivity disorder, unspecified type: Secondary | ICD-10-CM

## 2022-02-07 DIAGNOSIS — F32A Depression: Secondary | ICD-10-CM

## 2022-02-07 DIAGNOSIS — M797 Fibromyalgia: Secondary | ICD-10-CM

## 2022-02-07 DIAGNOSIS — M549 Dorsalgia, unspecified: Secondary | ICD-10-CM

## 2022-02-07 DIAGNOSIS — I2699 Other pulmonary embolism without acute cor pulmonale: Secondary | ICD-10-CM

## 2022-02-07 DIAGNOSIS — N920 Excessive and frequent menstruation with regular cycle: Secondary | ICD-10-CM

## 2022-02-07 DIAGNOSIS — E079 Disorder of thyroid, unspecified: Secondary | ICD-10-CM

## 2022-02-07 DIAGNOSIS — IMO0002 Chiari malformation: Secondary | ICD-10-CM

## 2022-02-07 DIAGNOSIS — G43909 Migraine, unspecified, not intractable, without status migrainosus: Secondary | ICD-10-CM

## 2022-02-07 DIAGNOSIS — R110 Nausea: Secondary | ICD-10-CM

## 2022-02-07 DIAGNOSIS — M2550 Pain in unspecified joint: Secondary | ICD-10-CM

## 2022-02-07 DIAGNOSIS — R519 Generalized headaches: Secondary | ICD-10-CM

## 2022-02-07 DIAGNOSIS — M1990 Unspecified osteoarthritis, unspecified site: Secondary | ICD-10-CM

## 2022-02-07 DIAGNOSIS — M5136 Other intervertebral disc degeneration, lumbar region: Secondary | ICD-10-CM

## 2022-02-07 DIAGNOSIS — E890 Postprocedural hypothyroidism: Secondary | ICD-10-CM

## 2022-02-07 DIAGNOSIS — R002 Palpitations: Secondary | ICD-10-CM

## 2022-02-07 DIAGNOSIS — M479 Spondylosis, unspecified: Secondary | ICD-10-CM

## 2022-02-07 DIAGNOSIS — R0789 Other chest pain: Secondary | ICD-10-CM

## 2022-02-07 DIAGNOSIS — S0990XA Unspecified injury of head, initial encounter: Secondary | ICD-10-CM

## 2022-02-07 DIAGNOSIS — R011 Cardiac murmur, unspecified: Secondary | ICD-10-CM

## 2022-02-07 DIAGNOSIS — I82409 Acute embolism and thrombosis of unspecified deep veins of unspecified lower extremity: Secondary | ICD-10-CM

## 2022-02-07 DIAGNOSIS — G935 Compression of brain: Secondary | ICD-10-CM

## 2022-02-07 DIAGNOSIS — F419 Anxiety disorder, unspecified: Secondary | ICD-10-CM

## 2022-02-07 DIAGNOSIS — M5030 Other cervical disc degeneration, unspecified cervical region: Secondary | ICD-10-CM

## 2022-02-07 DIAGNOSIS — J189 Pneumonia, unspecified organism: Secondary | ICD-10-CM

## 2022-02-08 ENCOUNTER — Encounter: Admit: 2022-02-08 | Discharge: 2022-02-08 | Payer: Medicaid Other

## 2022-02-08 DIAGNOSIS — E039 Hypothyroidism, unspecified: Secondary | ICD-10-CM

## 2022-02-08 DIAGNOSIS — N951 Menopausal and female climacteric states: Secondary | ICD-10-CM

## 2022-02-08 NOTE — Telephone Encounter
Faxed lab orders to [facility] office  Received fax confirmation

## 2022-02-09 ENCOUNTER — Encounter: Admit: 2022-02-09 | Discharge: 2022-02-09 | Payer: Medicaid Other

## 2022-02-09 NOTE — Telephone Encounter
Pt called, LVM. Pt thought that she had more labs to do than the TSH??  She thought she needed orders for 24 urine and a dex test?   Please advise

## 2022-02-10 DIAGNOSIS — R635 Abnormal weight gain: Secondary | ICD-10-CM

## 2022-02-12 ENCOUNTER — Encounter: Admit: 2022-02-12 | Discharge: 2022-02-12 | Payer: Medicaid Other

## 2022-02-12 DIAGNOSIS — R635 Abnormal weight gain: Secondary | ICD-10-CM

## 2022-02-14 ENCOUNTER — Encounter: Admit: 2022-02-14 | Discharge: 2022-02-14 | Payer: Medicaid Other

## 2022-03-11 ENCOUNTER — Encounter: Admit: 2022-03-11 | Discharge: 2022-03-11 | Payer: Medicaid Other

## 2022-03-13 ENCOUNTER — Encounter: Admit: 2022-03-13 | Discharge: 2022-03-13 | Payer: Medicaid Other

## 2022-03-13 NOTE — Telephone Encounter
-    Voice Message from the pt stating that she wants to know what Dr Sela Hua wants to do with her appt because she had to push her MRI Appt out to 04/2022.    -  MRI scheduled for 2/9 & Procedure Appt scheduled for 2/12.    -  Routed to Dr Sela Hua for review.

## 2022-03-14 ENCOUNTER — Encounter: Admit: 2022-03-14 | Discharge: 2022-03-14 | Payer: Medicaid Other

## 2022-03-23 ENCOUNTER — Encounter: Admit: 2022-03-23 | Discharge: 2022-03-23 | Payer: Medicaid Other

## 2022-03-23 DIAGNOSIS — E039 Hypothyroidism, unspecified: Secondary | ICD-10-CM

## 2022-03-23 LAB — TSH WITH FREE T4 REFLEX: TSH: 1.2 m[IU]/mL (ref 0.35–4.94)

## 2022-03-23 MED ORDER — DEXAMETHASONE 1 MG PO TAB
1 mg | ORAL_TABLET | Freq: Two times a day (BID) | ORAL | 0 refills | 12.50000 days | Status: DC
Start: 2022-03-23 — End: 2022-03-23

## 2022-03-23 MED ORDER — DEXAMETHASONE 1 MG PO TAB
1 mg | ORAL_TABLET | Freq: Once | ORAL | 0 refills | 12.50000 days | Status: AC
Start: 2022-03-23 — End: ?

## 2022-03-23 NOTE — Telephone Encounter
Received voicemail from patient  Patient asking about medication related to her labs    Called patient  Patient needing Dexamethasone tablet ordered   Routing to [employer]

## 2022-03-30 ENCOUNTER — Encounter: Admit: 2022-03-30 | Discharge: 2022-03-30 | Payer: Medicaid Other

## 2022-04-02 ENCOUNTER — Encounter: Admit: 2022-04-02 | Discharge: 2022-04-02 | Payer: Medicaid Other

## 2022-04-02 NOTE — Telephone Encounter
Received Lab records from [employer] [facility] to [place] for review

## 2022-04-05 ENCOUNTER — Ambulatory Visit: Admit: 2022-04-05 | Discharge: 2022-04-06 | Payer: Medicaid Other

## 2022-04-05 ENCOUNTER — Encounter: Admit: 2022-04-05 | Discharge: 2022-04-05 | Payer: Medicaid Other

## 2022-04-05 DIAGNOSIS — N951 Menopausal and female climacteric states: Secondary | ICD-10-CM

## 2022-04-11 ENCOUNTER — Encounter: Admit: 2022-04-11 | Discharge: 2022-04-11 | Payer: Medicaid Other

## 2022-04-11 ENCOUNTER — Ambulatory Visit: Admit: 2022-04-11 | Discharge: 2022-04-11 | Payer: Medicaid Other

## 2022-04-11 ENCOUNTER — Ambulatory Visit: Admit: 2022-04-11 | Discharge: 2022-04-12 | Payer: Medicaid Other

## 2022-04-11 DIAGNOSIS — R519 Generalized headaches: Secondary | ICD-10-CM

## 2022-04-11 DIAGNOSIS — R0789 Other chest pain: Secondary | ICD-10-CM

## 2022-04-11 DIAGNOSIS — J189 Pneumonia, unspecified organism: Secondary | ICD-10-CM

## 2022-04-11 DIAGNOSIS — I2699 Other pulmonary embolism without acute cor pulmonale: Secondary | ICD-10-CM

## 2022-04-11 DIAGNOSIS — M5416 Radiculopathy, lumbar region: Secondary | ICD-10-CM

## 2022-04-11 DIAGNOSIS — M5030 Other cervical disc degeneration, unspecified cervical region: Secondary | ICD-10-CM

## 2022-04-11 DIAGNOSIS — K5732 Diverticulitis of large intestine without perforation or abscess without bleeding: Secondary | ICD-10-CM

## 2022-04-11 DIAGNOSIS — G43909 Migraine, unspecified, not intractable, without status migrainosus: Secondary | ICD-10-CM

## 2022-04-11 DIAGNOSIS — M5136 Other intervertebral disc degeneration, lumbar region: Secondary | ICD-10-CM

## 2022-04-11 DIAGNOSIS — M2550 Pain in unspecified joint: Secondary | ICD-10-CM

## 2022-04-11 DIAGNOSIS — R0602 Shortness of breath: Secondary | ICD-10-CM

## 2022-04-11 DIAGNOSIS — M797 Fibromyalgia: Secondary | ICD-10-CM

## 2022-04-11 DIAGNOSIS — Q0700 Arnold-Chiari syndrome without spina bifida or hydrocephalus: Secondary | ICD-10-CM

## 2022-04-11 DIAGNOSIS — M1990 Unspecified osteoarthritis, unspecified site: Secondary | ICD-10-CM

## 2022-04-11 DIAGNOSIS — G935 Compression of brain: Secondary | ICD-10-CM

## 2022-04-11 DIAGNOSIS — E079 Disorder of thyroid, unspecified: Secondary | ICD-10-CM

## 2022-04-11 DIAGNOSIS — F419 Anxiety disorder, unspecified: Secondary | ICD-10-CM

## 2022-04-11 DIAGNOSIS — G971 Other reaction to spinal and lumbar puncture: Secondary | ICD-10-CM

## 2022-04-11 DIAGNOSIS — R011 Cardiac murmur, unspecified: Secondary | ICD-10-CM

## 2022-04-11 DIAGNOSIS — K219 Gastro-esophageal reflux disease without esophagitis: Secondary | ICD-10-CM

## 2022-04-11 DIAGNOSIS — R110 Nausea: Secondary | ICD-10-CM

## 2022-04-11 DIAGNOSIS — IMO0002 Chiari malformation: Secondary | ICD-10-CM

## 2022-04-11 DIAGNOSIS — M5134 Other intervertebral disc degeneration, thoracic region: Secondary | ICD-10-CM

## 2022-04-11 DIAGNOSIS — R079 Chest pain, unspecified: Secondary | ICD-10-CM

## 2022-04-11 DIAGNOSIS — S0990XA Unspecified injury of head, initial encounter: Secondary | ICD-10-CM

## 2022-04-11 DIAGNOSIS — F909 Attention-deficit hyperactivity disorder, unspecified type: Secondary | ICD-10-CM

## 2022-04-11 DIAGNOSIS — F32A Depression: Secondary | ICD-10-CM

## 2022-04-11 DIAGNOSIS — M549 Dorsalgia, unspecified: Secondary | ICD-10-CM

## 2022-04-11 DIAGNOSIS — C801 Malignant (primary) neoplasm, unspecified: Secondary | ICD-10-CM

## 2022-04-11 DIAGNOSIS — R002 Palpitations: Secondary | ICD-10-CM

## 2022-04-11 DIAGNOSIS — E663 Overweight: Secondary | ICD-10-CM

## 2022-04-11 DIAGNOSIS — N920 Excessive and frequent menstruation with regular cycle: Secondary | ICD-10-CM

## 2022-04-11 DIAGNOSIS — I82409 Acute embolism and thrombosis of unspecified deep veins of unspecified lower extremity: Secondary | ICD-10-CM

## 2022-04-11 DIAGNOSIS — R52 Pain, unspecified: Secondary | ICD-10-CM

## 2022-04-11 DIAGNOSIS — J45909 Unspecified asthma, uncomplicated: Secondary | ICD-10-CM

## 2022-04-11 DIAGNOSIS — M479 Spondylosis, unspecified: Secondary | ICD-10-CM

## 2022-04-11 MED ORDER — FAMOTIDINE 20 MG PO TAB
20 mg | ORAL_TABLET | Freq: Every evening | ORAL | 0 refills | 90.00000 days | Status: AC
Start: 2022-04-11 — End: ?

## 2022-04-11 NOTE — Patient Instructions
It was a pleasure seeing you today.      My nurse is Kendra Sewell, RN.  She can be reached at [phone].    Please contact my nurse with any signs and symptoms of worsening productive cough with thick secretions, blood in sputum, chest pain or tightness, shortness of breath, fever, chills, night sweats, or any questions or concerns.    For refills on medications, please have your pharmacy request a refill authorization either electronically or via fax to our office at [phone]. Please allow at least 3 business days for refill requests.    For urgent issues after business hours, weekends, or holidays please call [phone] and request for the pulmonary fellow to be paged.    If you need to cancel or reschedule an appointment, please call [phone].

## 2022-04-12 DIAGNOSIS — J4540 Moderate persistent asthma, uncomplicated: Secondary | ICD-10-CM

## 2022-04-16 ENCOUNTER — Encounter: Admit: 2022-04-16 | Discharge: 2022-04-16 | Payer: Medicaid Other

## 2022-04-16 ENCOUNTER — Ambulatory Visit: Admit: 2022-04-16 | Discharge: 2022-04-16 | Payer: Medicaid Other

## 2022-04-16 DIAGNOSIS — R002 Palpitations: Secondary | ICD-10-CM

## 2022-04-16 DIAGNOSIS — E663 Overweight: Secondary | ICD-10-CM

## 2022-04-16 DIAGNOSIS — M7918 Myalgia, other site: Secondary | ICD-10-CM

## 2022-04-16 DIAGNOSIS — J189 Pneumonia, unspecified organism: Secondary | ICD-10-CM

## 2022-04-16 DIAGNOSIS — C801 Malignant (primary) neoplasm, unspecified: Secondary | ICD-10-CM

## 2022-04-16 DIAGNOSIS — J45909 Unspecified asthma, uncomplicated: Secondary | ICD-10-CM

## 2022-04-16 DIAGNOSIS — I82409 Acute embolism and thrombosis of unspecified deep veins of unspecified lower extremity: Secondary | ICD-10-CM

## 2022-04-16 DIAGNOSIS — M797 Fibromyalgia: Secondary | ICD-10-CM

## 2022-04-16 DIAGNOSIS — I2699 Other pulmonary embolism without acute cor pulmonale: Secondary | ICD-10-CM

## 2022-04-16 DIAGNOSIS — M2550 Pain in unspecified joint: Secondary | ICD-10-CM

## 2022-04-16 DIAGNOSIS — M5136 Other intervertebral disc degeneration, lumbar region: Secondary | ICD-10-CM

## 2022-04-16 DIAGNOSIS — R079 Chest pain, unspecified: Secondary | ICD-10-CM

## 2022-04-16 DIAGNOSIS — M479 Spondylosis, unspecified: Secondary | ICD-10-CM

## 2022-04-16 DIAGNOSIS — M1990 Unspecified osteoarthritis, unspecified site: Secondary | ICD-10-CM

## 2022-04-16 DIAGNOSIS — E079 Disorder of thyroid, unspecified: Secondary | ICD-10-CM

## 2022-04-16 DIAGNOSIS — M5134 Other intervertebral disc degeneration, thoracic region: Secondary | ICD-10-CM

## 2022-04-16 DIAGNOSIS — G43909 Migraine, unspecified, not intractable, without status migrainosus: Secondary | ICD-10-CM

## 2022-04-16 DIAGNOSIS — G935 Compression of brain: Secondary | ICD-10-CM

## 2022-04-16 DIAGNOSIS — K219 Gastro-esophageal reflux disease without esophagitis: Secondary | ICD-10-CM

## 2022-04-16 DIAGNOSIS — R519 Generalized headaches: Secondary | ICD-10-CM

## 2022-04-16 DIAGNOSIS — G971 Other reaction to spinal and lumbar puncture: Secondary | ICD-10-CM

## 2022-04-16 DIAGNOSIS — R011 Cardiac murmur, unspecified: Secondary | ICD-10-CM

## 2022-04-16 DIAGNOSIS — S0990XA Unspecified injury of head, initial encounter: Secondary | ICD-10-CM

## 2022-04-16 DIAGNOSIS — M549 Dorsalgia, unspecified: Secondary | ICD-10-CM

## 2022-04-16 DIAGNOSIS — K5732 Diverticulitis of large intestine without perforation or abscess without bleeding: Secondary | ICD-10-CM

## 2022-04-16 DIAGNOSIS — F909 Attention-deficit hyperactivity disorder, unspecified type: Secondary | ICD-10-CM

## 2022-04-16 DIAGNOSIS — Q0700 Arnold-Chiari syndrome without spina bifida or hydrocephalus: Secondary | ICD-10-CM

## 2022-04-16 DIAGNOSIS — M5416 Radiculopathy, lumbar region: Secondary | ICD-10-CM

## 2022-04-16 DIAGNOSIS — R52 Pain, unspecified: Secondary | ICD-10-CM

## 2022-04-16 DIAGNOSIS — R0789 Other chest pain: Secondary | ICD-10-CM

## 2022-04-16 DIAGNOSIS — F32A Depression: Secondary | ICD-10-CM

## 2022-04-16 DIAGNOSIS — F419 Anxiety disorder, unspecified: Secondary | ICD-10-CM

## 2022-04-16 DIAGNOSIS — R110 Nausea: Secondary | ICD-10-CM

## 2022-04-16 DIAGNOSIS — M5030 Other cervical disc degeneration, unspecified cervical region: Secondary | ICD-10-CM

## 2022-04-16 DIAGNOSIS — IMO0002 Chiari malformation: Secondary | ICD-10-CM

## 2022-04-16 DIAGNOSIS — N920 Excessive and frequent menstruation with regular cycle: Secondary | ICD-10-CM

## 2022-04-25 ENCOUNTER — Encounter: Admit: 2022-04-25 | Discharge: 2022-04-25 | Payer: Medicaid Other

## 2022-04-25 MED ORDER — LEVOTHYROXINE 112 MCG PO TAB
112 ug | ORAL_TABLET | Freq: Every day | ORAL | 2 refills | 30.00000 days | Status: AC
Start: 2022-04-25 — End: ?

## 2022-05-03 ENCOUNTER — Encounter: Admit: 2022-05-03 | Discharge: 2022-05-03 | Payer: Medicaid Other

## 2022-05-03 DIAGNOSIS — M545 Lumbar pain: Secondary | ICD-10-CM

## 2022-05-08 ENCOUNTER — Encounter: Admit: 2022-05-08 | Discharge: 2022-05-08 | Payer: Medicaid Other

## 2022-05-08 ENCOUNTER — Ambulatory Visit: Admit: 2022-05-08 | Discharge: 2022-05-09 | Payer: Medicaid Other

## 2022-05-08 DIAGNOSIS — F32A Depression: Secondary | ICD-10-CM

## 2022-05-08 DIAGNOSIS — R110 Nausea: Secondary | ICD-10-CM

## 2022-05-08 DIAGNOSIS — N920 Excessive and frequent menstruation with regular cycle: Secondary | ICD-10-CM

## 2022-05-08 DIAGNOSIS — K5732 Diverticulitis of large intestine without perforation or abscess without bleeding: Secondary | ICD-10-CM

## 2022-05-08 DIAGNOSIS — J45909 Unspecified asthma, uncomplicated: Secondary | ICD-10-CM

## 2022-05-08 DIAGNOSIS — S0990XA Unspecified injury of head, initial encounter: Secondary | ICD-10-CM

## 2022-05-08 DIAGNOSIS — M5030 Other cervical disc degeneration, unspecified cervical region: Secondary | ICD-10-CM

## 2022-05-08 DIAGNOSIS — I82409 Acute embolism and thrombosis of unspecified deep veins of unspecified lower extremity: Secondary | ICD-10-CM

## 2022-05-08 DIAGNOSIS — E663 Overweight: Secondary | ICD-10-CM

## 2022-05-08 DIAGNOSIS — G935 Compression of brain: Secondary | ICD-10-CM

## 2022-05-08 DIAGNOSIS — M1990 Unspecified osteoarthritis, unspecified site: Secondary | ICD-10-CM

## 2022-05-08 DIAGNOSIS — I2699 Other pulmonary embolism without acute cor pulmonale: Secondary | ICD-10-CM

## 2022-05-08 DIAGNOSIS — M797 Fibromyalgia: Secondary | ICD-10-CM

## 2022-05-08 DIAGNOSIS — R519 Generalized headaches: Secondary | ICD-10-CM

## 2022-05-08 DIAGNOSIS — M5134 Other intervertebral disc degeneration, thoracic region: Secondary | ICD-10-CM

## 2022-05-08 DIAGNOSIS — G43909 Migraine, unspecified, not intractable, without status migrainosus: Secondary | ICD-10-CM

## 2022-05-08 DIAGNOSIS — Q0700 Arnold-Chiari syndrome without spina bifida or hydrocephalus: Secondary | ICD-10-CM

## 2022-05-08 DIAGNOSIS — E079 Disorder of thyroid, unspecified: Secondary | ICD-10-CM

## 2022-05-08 DIAGNOSIS — G971 Other reaction to spinal and lumbar puncture: Secondary | ICD-10-CM

## 2022-05-08 DIAGNOSIS — M5136 Other intervertebral disc degeneration, lumbar region: Secondary | ICD-10-CM

## 2022-05-08 DIAGNOSIS — F419 Anxiety disorder, unspecified: Secondary | ICD-10-CM

## 2022-05-08 DIAGNOSIS — R52 Pain, unspecified: Secondary | ICD-10-CM

## 2022-05-08 DIAGNOSIS — J189 Pneumonia, unspecified organism: Secondary | ICD-10-CM

## 2022-05-08 DIAGNOSIS — M479 Spondylosis, unspecified: Secondary | ICD-10-CM

## 2022-05-08 DIAGNOSIS — M2550 Pain in unspecified joint: Secondary | ICD-10-CM

## 2022-05-08 DIAGNOSIS — C801 Malignant (primary) neoplasm, unspecified: Secondary | ICD-10-CM

## 2022-05-08 DIAGNOSIS — L821 Other seborrheic keratosis: Secondary | ICD-10-CM

## 2022-05-08 DIAGNOSIS — IMO0002 Chiari malformation: Secondary | ICD-10-CM

## 2022-05-08 DIAGNOSIS — R079 Chest pain, unspecified: Secondary | ICD-10-CM

## 2022-05-08 DIAGNOSIS — D229 Melanocytic nevi, unspecified: Secondary | ICD-10-CM

## 2022-05-08 DIAGNOSIS — R002 Palpitations: Secondary | ICD-10-CM

## 2022-05-08 DIAGNOSIS — R011 Cardiac murmur, unspecified: Secondary | ICD-10-CM

## 2022-05-08 DIAGNOSIS — K219 Gastro-esophageal reflux disease without esophagitis: Secondary | ICD-10-CM

## 2022-05-08 DIAGNOSIS — M549 Dorsalgia, unspecified: Secondary | ICD-10-CM

## 2022-05-08 DIAGNOSIS — R0789 Other chest pain: Secondary | ICD-10-CM

## 2022-05-08 DIAGNOSIS — B078 Other viral warts: Secondary | ICD-10-CM

## 2022-05-08 DIAGNOSIS — F909 Attention-deficit hyperactivity disorder, unspecified type: Secondary | ICD-10-CM

## 2022-05-09 ENCOUNTER — Encounter: Admit: 2022-05-09 | Discharge: 2022-05-09 | Payer: Medicaid Other

## 2022-05-17 ENCOUNTER — Encounter: Admit: 2022-05-17 | Discharge: 2022-05-17 | Payer: Medicaid Other

## 2022-05-17 ENCOUNTER — Ambulatory Visit: Admit: 2022-05-17 | Discharge: 2022-05-17 | Payer: Medicaid Other

## 2022-05-17 DIAGNOSIS — J189 Pneumonia, unspecified organism: Secondary | ICD-10-CM

## 2022-05-17 DIAGNOSIS — R011 Cardiac murmur, unspecified: Secondary | ICD-10-CM

## 2022-05-17 DIAGNOSIS — M5136 Other intervertebral disc degeneration, lumbar region: Secondary | ICD-10-CM

## 2022-05-17 DIAGNOSIS — G971 Other reaction to spinal and lumbar puncture: Secondary | ICD-10-CM

## 2022-05-17 DIAGNOSIS — E663 Overweight: Secondary | ICD-10-CM

## 2022-05-17 DIAGNOSIS — M5030 Other cervical disc degeneration, unspecified cervical region: Secondary | ICD-10-CM

## 2022-05-17 DIAGNOSIS — F419 Anxiety disorder, unspecified: Secondary | ICD-10-CM

## 2022-05-17 DIAGNOSIS — M479 Spondylosis, unspecified: Secondary | ICD-10-CM

## 2022-05-17 DIAGNOSIS — M1990 Unspecified osteoarthritis, unspecified site: Secondary | ICD-10-CM

## 2022-05-17 DIAGNOSIS — K5732 Diverticulitis of large intestine without perforation or abscess without bleeding: Secondary | ICD-10-CM

## 2022-05-17 DIAGNOSIS — M549 Dorsalgia, unspecified: Secondary | ICD-10-CM

## 2022-05-17 DIAGNOSIS — G43909 Migraine, unspecified, not intractable, without status migrainosus: Secondary | ICD-10-CM

## 2022-05-17 DIAGNOSIS — Q0700 Arnold-Chiari syndrome without spina bifida or hydrocephalus: Secondary | ICD-10-CM

## 2022-05-17 DIAGNOSIS — G5793 Unspecified mononeuropathy of bilateral lower limbs: Secondary | ICD-10-CM

## 2022-05-17 DIAGNOSIS — M48061 Spinal stenosis, lumbar region without neurogenic claudication: Secondary | ICD-10-CM

## 2022-05-17 DIAGNOSIS — Z8669 Personal history of other diseases of the nervous system and sense organs: Secondary | ICD-10-CM

## 2022-05-17 DIAGNOSIS — E079 Disorder of thyroid, unspecified: Secondary | ICD-10-CM

## 2022-05-17 DIAGNOSIS — F909 Attention-deficit hyperactivity disorder, unspecified type: Secondary | ICD-10-CM

## 2022-05-17 DIAGNOSIS — M545 Chronic midline low back pain without sciatica: Secondary | ICD-10-CM

## 2022-05-17 DIAGNOSIS — M2550 Pain in unspecified joint: Secondary | ICD-10-CM

## 2022-05-17 DIAGNOSIS — R0789 Other chest pain: Secondary | ICD-10-CM

## 2022-05-17 DIAGNOSIS — C801 Malignant (primary) neoplasm, unspecified: Secondary | ICD-10-CM

## 2022-05-17 DIAGNOSIS — I82409 Acute embolism and thrombosis of unspecified deep veins of unspecified lower extremity: Secondary | ICD-10-CM

## 2022-05-17 DIAGNOSIS — J45909 Unspecified asthma, uncomplicated: Secondary | ICD-10-CM

## 2022-05-17 DIAGNOSIS — R110 Nausea: Secondary | ICD-10-CM

## 2022-05-17 DIAGNOSIS — M797 Fibromyalgia: Secondary | ICD-10-CM

## 2022-05-17 DIAGNOSIS — K219 Gastro-esophageal reflux disease without esophagitis: Secondary | ICD-10-CM

## 2022-05-17 DIAGNOSIS — G935 Compression of brain: Secondary | ICD-10-CM

## 2022-05-17 DIAGNOSIS — Z9889 Other specified postprocedural states: Secondary | ICD-10-CM

## 2022-05-17 DIAGNOSIS — I2699 Other pulmonary embolism without acute cor pulmonale: Secondary | ICD-10-CM

## 2022-05-17 DIAGNOSIS — M5134 Other intervertebral disc degeneration, thoracic region: Secondary | ICD-10-CM

## 2022-05-17 DIAGNOSIS — F32A Depression: Secondary | ICD-10-CM

## 2022-05-17 DIAGNOSIS — R519 Generalized headaches: Secondary | ICD-10-CM

## 2022-05-17 DIAGNOSIS — R079 Chest pain, unspecified: Secondary | ICD-10-CM

## 2022-05-17 DIAGNOSIS — S0990XA Unspecified injury of head, initial encounter: Secondary | ICD-10-CM

## 2022-05-17 DIAGNOSIS — IMO0002 Chiari malformation: Secondary | ICD-10-CM

## 2022-05-17 DIAGNOSIS — R002 Palpitations: Secondary | ICD-10-CM

## 2022-05-17 DIAGNOSIS — R52 Pain, unspecified: Secondary | ICD-10-CM

## 2022-05-17 DIAGNOSIS — N920 Excessive and frequent menstruation with regular cycle: Secondary | ICD-10-CM

## 2022-05-18 ENCOUNTER — Encounter: Admit: 2022-05-18 | Discharge: 2022-05-18 | Payer: Medicaid Other

## 2022-05-18 DIAGNOSIS — M5416 Radiculopathy, lumbar region: Secondary | ICD-10-CM

## 2022-06-05 ENCOUNTER — Encounter: Admit: 2022-06-05 | Discharge: 2022-06-05 | Payer: Medicaid Other

## 2022-06-05 ENCOUNTER — Ambulatory Visit: Admit: 2022-06-05 | Discharge: 2022-06-05 | Payer: Medicaid Other

## 2022-06-05 DIAGNOSIS — R0602 Shortness of breath: Secondary | ICD-10-CM

## 2022-06-18 ENCOUNTER — Encounter: Admit: 2022-06-18 | Discharge: 2022-06-18 | Payer: Medicaid Other

## 2022-07-04 ENCOUNTER — Encounter: Admit: 2022-07-04 | Discharge: 2022-07-04 | Payer: Medicaid Other

## 2022-07-04 ENCOUNTER — Ambulatory Visit: Admit: 2022-07-04 | Discharge: 2022-07-04 | Payer: Medicaid Other

## 2022-07-04 DIAGNOSIS — E079 Disorder of thyroid, unspecified: Secondary | ICD-10-CM

## 2022-07-04 DIAGNOSIS — R079 Chest pain, unspecified: Secondary | ICD-10-CM

## 2022-07-04 DIAGNOSIS — Q0700 Arnold-Chiari syndrome without spina bifida or hydrocephalus: Secondary | ICD-10-CM

## 2022-07-04 DIAGNOSIS — IMO0002 Chiari malformation: Secondary | ICD-10-CM

## 2022-07-04 DIAGNOSIS — R519 Generalized headaches: Secondary | ICD-10-CM

## 2022-07-04 DIAGNOSIS — C801 Malignant (primary) neoplasm, unspecified: Secondary | ICD-10-CM

## 2022-07-04 DIAGNOSIS — G971 Other reaction to spinal and lumbar puncture: Secondary | ICD-10-CM

## 2022-07-04 DIAGNOSIS — M5136 Other intervertebral disc degeneration, lumbar region: Secondary | ICD-10-CM

## 2022-07-04 DIAGNOSIS — M549 Dorsalgia, unspecified: Secondary | ICD-10-CM

## 2022-07-04 DIAGNOSIS — R0789 Other chest pain: Secondary | ICD-10-CM

## 2022-07-04 DIAGNOSIS — M797 Fibromyalgia: Secondary | ICD-10-CM

## 2022-07-04 DIAGNOSIS — M1990 Unspecified osteoarthritis, unspecified site: Secondary | ICD-10-CM

## 2022-07-04 DIAGNOSIS — S0990XA Unspecified injury of head, initial encounter: Secondary | ICD-10-CM

## 2022-07-04 DIAGNOSIS — F909 Attention-deficit hyperactivity disorder, unspecified type: Secondary | ICD-10-CM

## 2022-07-04 DIAGNOSIS — K5732 Diverticulitis of large intestine without perforation or abscess without bleeding: Secondary | ICD-10-CM

## 2022-07-04 DIAGNOSIS — R110 Nausea: Secondary | ICD-10-CM

## 2022-07-04 DIAGNOSIS — F32A Depression: Secondary | ICD-10-CM

## 2022-07-04 DIAGNOSIS — F419 Anxiety disorder, unspecified: Secondary | ICD-10-CM

## 2022-07-04 DIAGNOSIS — M479 Spondylosis, unspecified: Secondary | ICD-10-CM

## 2022-07-04 DIAGNOSIS — R0602 Shortness of breath: Secondary | ICD-10-CM

## 2022-07-04 DIAGNOSIS — K219 Gastro-esophageal reflux disease without esophagitis: Secondary | ICD-10-CM

## 2022-07-04 DIAGNOSIS — E663 Overweight: Secondary | ICD-10-CM

## 2022-07-04 DIAGNOSIS — J189 Pneumonia, unspecified organism: Secondary | ICD-10-CM

## 2022-07-04 DIAGNOSIS — I2699 Other pulmonary embolism without acute cor pulmonale: Secondary | ICD-10-CM

## 2022-07-04 DIAGNOSIS — G935 Compression of brain: Secondary | ICD-10-CM

## 2022-07-04 DIAGNOSIS — M5134 Other intervertebral disc degeneration, thoracic region: Secondary | ICD-10-CM

## 2022-07-04 DIAGNOSIS — R52 Pain, unspecified: Secondary | ICD-10-CM

## 2022-07-04 DIAGNOSIS — M2550 Pain in unspecified joint: Secondary | ICD-10-CM

## 2022-07-04 DIAGNOSIS — J4520 Mild intermittent asthma, uncomplicated: Secondary | ICD-10-CM

## 2022-07-04 DIAGNOSIS — J45909 Unspecified asthma, uncomplicated: Secondary | ICD-10-CM

## 2022-07-04 DIAGNOSIS — R011 Cardiac murmur, unspecified: Secondary | ICD-10-CM

## 2022-07-04 DIAGNOSIS — M5030 Other cervical disc degeneration, unspecified cervical region: Secondary | ICD-10-CM

## 2022-07-04 DIAGNOSIS — R002 Palpitations: Secondary | ICD-10-CM

## 2022-07-04 DIAGNOSIS — G43909 Migraine, unspecified, not intractable, without status migrainosus: Secondary | ICD-10-CM

## 2022-07-04 DIAGNOSIS — I82409 Acute embolism and thrombosis of unspecified deep veins of unspecified lower extremity: Secondary | ICD-10-CM

## 2022-07-04 DIAGNOSIS — N920 Excessive and frequent menstruation with regular cycle: Secondary | ICD-10-CM

## 2022-07-04 MED ORDER — AIRSUPRA 90-80 MCG/ACTUATION IN HFAA
2 | RESPIRATORY_TRACT | 11 refills | 10.00000 days | Status: AC | PRN
Start: 2022-07-04 — End: ?

## 2022-07-04 NOTE — Patient Instructions
It was a pleasure seeing you today.      My nurse is Kendra Sewell, RN.  She can be reached at [phone].    Please contact my nurse with any signs and symptoms of worsening productive cough with thick secretions, blood in sputum, chest pain or tightness, shortness of breath, fever, chills, night sweats, or any questions or concerns.    For refills on medications, please have your pharmacy request a refill authorization either electronically or via fax to our office at [phone]. Please allow at least 3 business days for refill requests.    For urgent issues after business hours, weekends, or holidays please call [phone] and request for the pulmonary fellow to be paged.    If you need to cancel or reschedule an appointment, please call [phone].

## 2022-07-09 ENCOUNTER — Encounter: Admit: 2022-07-09 | Discharge: 2022-07-09 | Payer: Medicaid Other

## 2022-07-09 NOTE — Telephone Encounter
Pt called & she had an appt today & asked to reschedule it because he has a temp so I told her the  next available was 08/27/22 at 8:20 am & she said ok if that is the next available so I scheduled it & now she is wanting to know if she can come in a little later that day I told her I can make that decision so she wanted me to reach out to the nurse?

## 2022-07-09 NOTE — Telephone Encounter
Patient requesting later appointment; rescheduled patient to July 5 at 9:40am with Dr. Mikki Santee.  June 24 appointment cancelled.

## 2022-07-09 NOTE — Telephone Encounter
Prior authorization request received for patient's [employer].    Authorization submitted to [facility] on 07/09/2022 via Cover My Meds.  Key: [id number].

## 2022-07-12 ENCOUNTER — Encounter: Admit: 2022-07-12 | Discharge: 2022-07-12 | Payer: Medicaid Other

## 2022-07-12 NOTE — Telephone Encounter
Patient reports she received more than 50% relief from her last trigger point injections on 04/16/22. She states her relief almost lasted 3 months and her pain has now returned to baseline. During this time she is able to increase her mobility and decrease her PRN medications.

## 2022-07-13 ENCOUNTER — Encounter: Admit: 2022-07-13 | Discharge: 2022-07-13 | Payer: Medicaid Other

## 2022-07-13 DIAGNOSIS — E039 Hypothyroidism, unspecified: Secondary | ICD-10-CM

## 2022-07-13 NOTE — Telephone Encounter
Received VM asking if she needs to do more labs pt states she is down to 179 lbs   Routing to provider

## 2022-07-13 NOTE — Telephone Encounter
While speaking with pt regarding PA update for Airsupra, pt reported decreased O2 levels.    Pt states she has been experiencing dizziness and lightheadedness for quite some time and has undergone extensive cardiac workup to determine the cause.  Pt reports that over the past week, she has started monitoring her oxygen levels during these episodes and noticed her oxygen is in the low-mid 80's.      Pt states oxygen desats are intermittent and some times she can take her dog for a walk for 15-20 minutes and her oxygen levels are normal, and other times she can walk for 5 minutes and have to stop due to dizziness and her oxygen is in the 80's.  Pt states she does recover quickly with rest when this happens.    Pt denies any other symptoms including cough, wheezing, chest pain, fever, chills, and body aches.  Pt has never had exercise oximetry testing.      Discussed with pt that if oxygen levels drop below 89% while resting or do not recover with rest, or if pt develops new symptoms including chest pain or tightness, pt should be evaluated in the ED.  Will route to Dr. Domingo Dimes.

## 2022-07-13 NOTE — Telephone Encounter
Voalte sent to Dr. Domingo Dimes regarding pt's oxygen.  Dr. Domingo Dimes advised pt should continue to monitor symptoms and oxygen and complete exercise oximetry at follow up.      Pt notified of recommendations and again reiterated to go to the ED for new or worsening symptoms or if oxygen stays below 89%.  Pt voiced understanding and appreciation of call.

## 2022-07-16 ENCOUNTER — Encounter: Admit: 2022-07-16 | Discharge: 2022-07-16 | Payer: Medicaid Other

## 2022-07-16 ENCOUNTER — Ambulatory Visit: Admit: 2022-07-16 | Discharge: 2022-07-16 | Payer: Medicaid Other

## 2022-07-16 DIAGNOSIS — IMO0002 Chiari malformation: Secondary | ICD-10-CM

## 2022-07-16 DIAGNOSIS — M5134 Other intervertebral disc degeneration, thoracic region: Secondary | ICD-10-CM

## 2022-07-16 DIAGNOSIS — G935 Compression of brain: Secondary | ICD-10-CM

## 2022-07-16 DIAGNOSIS — K219 Gastro-esophageal reflux disease without esophagitis: Secondary | ICD-10-CM

## 2022-07-16 DIAGNOSIS — E079 Disorder of thyroid, unspecified: Secondary | ICD-10-CM

## 2022-07-16 DIAGNOSIS — G971 Other reaction to spinal and lumbar puncture: Secondary | ICD-10-CM

## 2022-07-16 DIAGNOSIS — R110 Nausea: Secondary | ICD-10-CM

## 2022-07-16 DIAGNOSIS — M479 Spondylosis, unspecified: Secondary | ICD-10-CM

## 2022-07-16 DIAGNOSIS — R0789 Other chest pain: Secondary | ICD-10-CM

## 2022-07-16 DIAGNOSIS — I2699 Other pulmonary embolism without acute cor pulmonale: Secondary | ICD-10-CM

## 2022-07-16 DIAGNOSIS — Q0700 Arnold-Chiari syndrome without spina bifida or hydrocephalus: Secondary | ICD-10-CM

## 2022-07-16 DIAGNOSIS — K5732 Diverticulitis of large intestine without perforation or abscess without bleeding: Secondary | ICD-10-CM

## 2022-07-16 DIAGNOSIS — R519 Generalized headaches: Secondary | ICD-10-CM

## 2022-07-16 DIAGNOSIS — M2550 Pain in unspecified joint: Secondary | ICD-10-CM

## 2022-07-16 DIAGNOSIS — F909 Attention-deficit hyperactivity disorder, unspecified type: Secondary | ICD-10-CM

## 2022-07-16 DIAGNOSIS — G43909 Migraine, unspecified, not intractable, without status migrainosus: Secondary | ICD-10-CM

## 2022-07-16 DIAGNOSIS — R011 Cardiac murmur, unspecified: Secondary | ICD-10-CM

## 2022-07-16 DIAGNOSIS — M5136 Other intervertebral disc degeneration, lumbar region: Secondary | ICD-10-CM

## 2022-07-16 DIAGNOSIS — M1990 Unspecified osteoarthritis, unspecified site: Secondary | ICD-10-CM

## 2022-07-16 DIAGNOSIS — E663 Overweight: Secondary | ICD-10-CM

## 2022-07-16 DIAGNOSIS — F419 Anxiety disorder, unspecified: Secondary | ICD-10-CM

## 2022-07-16 DIAGNOSIS — E039 Hypothyroidism, unspecified: Secondary | ICD-10-CM

## 2022-07-16 DIAGNOSIS — R079 Chest pain, unspecified: Secondary | ICD-10-CM

## 2022-07-16 DIAGNOSIS — M549 Dorsalgia, unspecified: Secondary | ICD-10-CM

## 2022-07-16 DIAGNOSIS — M797 Fibromyalgia: Secondary | ICD-10-CM

## 2022-07-16 DIAGNOSIS — R002 Palpitations: Secondary | ICD-10-CM

## 2022-07-16 DIAGNOSIS — L821 Other seborrheic keratosis: Secondary | ICD-10-CM

## 2022-07-16 DIAGNOSIS — F32A Depression: Secondary | ICD-10-CM

## 2022-07-16 DIAGNOSIS — M7918 Myalgia, other site: Secondary | ICD-10-CM

## 2022-07-16 DIAGNOSIS — M5030 Other cervical disc degeneration, unspecified cervical region: Secondary | ICD-10-CM

## 2022-07-16 DIAGNOSIS — S0990XA Unspecified injury of head, initial encounter: Secondary | ICD-10-CM

## 2022-07-16 DIAGNOSIS — I82409 Acute embolism and thrombosis of unspecified deep veins of unspecified lower extremity: Secondary | ICD-10-CM

## 2022-07-16 DIAGNOSIS — L820 Inflamed seborrheic keratosis: Secondary | ICD-10-CM

## 2022-07-16 DIAGNOSIS — J189 Pneumonia, unspecified organism: Secondary | ICD-10-CM

## 2022-07-16 DIAGNOSIS — J45909 Unspecified asthma, uncomplicated: Secondary | ICD-10-CM

## 2022-07-16 DIAGNOSIS — N920 Excessive and frequent menstruation with regular cycle: Secondary | ICD-10-CM

## 2022-07-16 DIAGNOSIS — C801 Malignant (primary) neoplasm, unspecified: Secondary | ICD-10-CM

## 2022-07-16 DIAGNOSIS — D229 Melanocytic nevi, unspecified: Secondary | ICD-10-CM

## 2022-07-16 DIAGNOSIS — L570 Actinic keratosis: Secondary | ICD-10-CM

## 2022-07-16 DIAGNOSIS — R52 Pain, unspecified: Secondary | ICD-10-CM

## 2022-07-16 DIAGNOSIS — M5416 Radiculopathy, lumbar region: Secondary | ICD-10-CM

## 2022-07-16 LAB — BASIC METABOLIC PANEL
ANION GAP: 10 (ref 3–12)
BLD UREA NITROGEN: 11 mg/dL (ref 7–25)
CALCIUM: 9.4 mg/dL (ref 8.5–10.6)
CHLORIDE: 103 MMOL/L (ref 98–110)
CO2: 32 MMOL/L — ABNORMAL HIGH (ref >60)
CREATININE: 0.7 mg/dL (ref 0.4–1.00)
EGFR: >60 mL/min (ref >60)
GLUCOSE,PANEL: 86 mg/dL (ref 70–100)
POTASSIUM: 3.1 MMOL/L — ABNORMAL LOW (ref 3.5–5.1)
SODIUM: 145 MMOL/L (ref 137–147)

## 2022-07-16 LAB — TSH WITH FREE T4 REFLEX: TSH: 1.5 uU/mL — ABNORMAL HIGH (ref 0.35–5.00)

## 2022-07-16 MED ORDER — BUPIVACAINE (PF) 0.25 % (2.5 MG/ML) IJ SOLN
1 mL | Freq: Once | INTRAMUSCULAR | 0 refills | Status: CP | PRN
Start: 2022-07-16 — End: ?

## 2022-07-16 MED ORDER — TRIAMCINOLONE ACETONIDE 40 MG/ML IJ SUSP
10 mg | Freq: Once | INTRAMUSCULAR | 0 refills | Status: CP | PRN
Start: 2022-07-16 — End: ?

## 2022-07-16 NOTE — Procedures
Attending Surgeon: Gabriel Rung, MD    Anesthesia: Local    Pre-Procedure Diagnosis:   1. Lumbar radiculopathy    2. Myalgia, other site        Post-Procedure Diagnosis:   1. Lumbar radiculopathy    2. Myalgia, other site        Pain Score: Four    [place] [facility] SPINE TRIGGER POINT INJECTION  Locations: L cervical/thoracic/lumbar paraspinal, R cervical/thoracic/lumbar paraspinal, R cervical, L cervical, L thoracic and R thoracic    Consent:   Consent obtained: verbal and written  Consent given by: patient  Alternatives discussed: alternative treatment     Universal Protocol:  Relevant documents: relevant documents present and verified  Test results: test results available and properly labeled  Imaging studies: imaging studies available  Required items: required blood products, implants, devices, and special equipment available  Site marked: the operative site was marked  Patient identity confirmed: Patient identify confirmed verbally with patient.        Time out: Immediately prior to procedure a time out was called to verify the correct patient, procedure, equipment, support staff and site/side marked as required      Procedures Details:   Prep: alcohol  Needle size: 27 G  Number of muscles: 3 or more    Approach: posterior    Patient tolerance: Patient tolerated the procedure well with no immediate complications. Pressure was applied, and hemostasis was accomplished.  Right Medication Administered - 10 mg triamcinolone acetonide 40 mg/mL; 1 mL bupivacaine PF 0.25 %  Medication Administered - 10 mg triamcinolone acetonide 40 mg/mL; 1 mL bupivacaine PF 0.25 %    Estimated blood loss: none or minimal  Specimens: none  Patient tolerated the procedure well with no immediate complications. Pressure was applied, and hemostasis was accomplished.

## 2022-07-16 NOTE — Progress Notes
SPINE CENTER CLINIC NOTE       SUBJECTIVE: Tina [name] presents for care regarding neck thoracic and lumbar pain.  Trigger point injections have reduced pain in the neck and thoracic region by more than 50% for 3 months at a time she wishes to have one repeated.  She is interested in trying a lumbar epidural steroid injection under fluoroscopy. This patient's clinical history, exam, AND imaging support radiculopathy AND there is a significant impact on quality of life and function AND the pain has been present for at least 4 weeks AND they have failed to improve with noninvasive conservative care.            Review of Systems    Current Outpatient Medications:     albuterol-budesonide (AIRSUPRA) 90-80 mcg/actuation HFA inhaler, Inhale two puffs by mouth into the lungs every 4 hours as needed for up to 30 days., Disp: 10.7 g, Rfl: 11    duloxetine DR (CYMBALTA) 20 mg capsule, , Disp: , Rfl:     EPINEPHrine(+) (EPIPEN) 1 mg/mL injection pen, Inject 0.3 mL into the muscle once as needed., Disp: , Rfl:     eszopiclone (LUNESTA) 3 mg tablet, , Disp: , Rfl:     furosemide (LASIX) 20 mg tablet, Take one tablet by mouth daily., Disp: , Rfl:     levothyroxine (SYNTHROID) 112 mcg tablet, Take one tablet by mouth daily 30 minutes before breakfast., Disp: 90 tablet, Rfl: 2    ondansetron (ZOFRAN ODT) 4 mg rapid dissolve tablet, , Disp: , Rfl:     phentermine (ADIPEX-P) 37.5 mg tablet, Take one tablet by mouth daily., Disp: , Rfl:     topiramate (TOPAMAX) 50 mg tablet, Take two tablets by mouth at bedtime daily., Disp: , Rfl:     WEGOVY 2.4 mg/0.75 mL injector PEN, Inject 2.4 mg under the skin every 7 days., Disp: , Rfl:   Allergies   Allergen Reactions    Aspirin ANAPHYLAXIS    Ibuprofen ANAPHYLAXIS     Patient can tolerate 800 mg tablet    Pcn [Penicillins] ANAPHYLAXIS    Reglan [Metoclopramide] SEE COMMENTS     Severe leg shakiness    Adhesive Tape (Rosins) RASH    Sulfa (Sulfonamide Antibiotics) NAUSEA AND VOMITING Omeprazole NAUSEA AND VOMITING    Prednisone NAUSEA AND VOMITING     Physical Exam  Vitals:    07/16/22 1256   BP: (P) 127/71   Pulse: (P) 84   SpO2: (P) 97%   PainSc: Four   Weight: 81.2 kg (179 lb)   Height: 170.2 cm (5' 7)     Oswestry Total Score:: 38  Pain Score: Four  Body mass index is 28.04 kg/m?Marland Kitchen  General: Alert and oriented, very pleasant female.   HEENT showed extraocular muscles were intact and no other abnormalities.  Unlabored breathing.  Regular rate and rhythm on CV exam.   5/5 strength in bilateral upper and lower extremities.    Sensation is intact to light touch and equal in the upper and lower extremities.   There is bilateral cervical and thoracic tenderness with trigger points palpable         IMPRESSION:  1. Lumbar radiculopathy    2. Myalgia, other site          PLAN: Will perform trigger point injections in the neck and thoracic region and schedule a lumbar epidural steroid injection under fluoroscopy at L5-S1

## 2022-07-16 NOTE — Patient Instructions
Thank you for choosing [facility]. I am happy that you are allowing me to participate in your care.     A summary of the recommendations made at today's visit is below:    General recommendations  Applying creams/ointments  If you were prescribed a topical medication, a small amount will suffice. A line of cream/ointment placed on your fingertip to the first joint (where the fingertip bends) should be sufficient to cover a surface area of the size of the palm of your hand. You do not need a thick layer the prescription cream/ointment.     Your rash may return after application of the prescription topical medicine. Repeat the course of treatment as discussed in your visit with me. Please let me know if you don't get enough relief, the rash spreads, or if new or worsening symptoms develop.    Gentle skin care for everyday  - Bathe with lukewarm water, 5-10 minutes. Dry thoroughly afterward by patting yourself with a towel. Avoid heavy rubbing  - Use a mild soap, such as Dove, Cetaphil, or any fragrance-free product  - Apply a moisturizer of your choice as much as you can (there's no limit like in prescription topical medications), but at least 2 times a day (even if no bath is taken) and after every bath.  After the bath, pat dry and apply the moisturizer right away. Use a thick moisturizer that says cream or ointment (not lotion that comes in a pump bottle - this is not as moisturizing, but does work). The stickier the better (like Vaseline or Aquaphor)! However, find one that you like and will use.  - For your face, you may use a gentle cleanser (such as CeraVe or Cetaphil just as examples) or plain water, followed by a non-comedogenic (meaning it won't clog pores) moisturizer with sunscreen SPF 30 or higher. A mineral sunscreen with zinc, titanium, or iron oxide are preferable      Sun protection is KEY to success in taking care of your skin!  Using sunscreens and sun avoidance will decrease your risk of developing skin cancers like melanoma, as well as prevent developing signs of aging like wrinkles, redness, and brown/dark spots.  - Avoid the sun at peak hours of the day (10 AM - 4 PM)   - Wear sunscreen SPF 30 or above. It should be reapplied every 2 hours if possible, or when you wipe off with a towel after swimming  - Use sunprotective clothing (shirts with long sleeves, pants, and wide-brimmed cowboy style hat). Baseball caps don't work well enough!    ** FOR MOISTURIZERS AND SUNSCREENS: I RECOMMEND ANY PRODUCT THAT YOU LIKE AND WILL USE CONSISTENTLY -- ANY BRAND/PRICE IS ACCEPTABLE **    Moles and other brown spots   - Common melanocytic nevi, or moles, tend to be less than 6 mm in diameter (size of a pencil eraser) and symmetric with even pigmentation, round or oval shape, regular outline, and sharp, non-fuzzy border. They can stick out from the skin.  - Flaky, seemingly stuck-on brown bumps are usually benign/non-cancerous keratoses, not moles. You will likely get more of these with time.  - Bright red smooth bumps that do not bleed are usually benign blood vessel lesions (cherry angiomas), not moles.  - It is normal to get new moles until the age of 30-40 years old.    Please contact me if you notice the following:  - A mole that differs from others (an ugly duckling that has   an odd shape, uneven or uncertain border, different colors), or one that changes, bleeds, or itches.  - Any growth that has one or more of the following features:  A - Asymmetry. (Concerning if spot is not symmetric--one half is unlike the other half)  B - Border. (Irregular, notched, or poorly defined border are concerning)  C - Color. (Multiple colors [shades of tan, brown or black, or is sometimes white, red, or blue] or changes in color are concerning)  D - Diameter. (Larger than 6mm, i.e., a pencil eraser)  E - Evolution. (An evolving or changing spot, which looks different from others on your body. If there's a new itch, tenderness, or bleeding develop, these are concerning changes too.  - An uneven, new, or large (greater than 3mm) brown or black streak underneath a fingernail or toenail, which may or may not spread onto the skin next to the nail  - A sore that just won't heal after about 2-4 weeks    Contact info  Routine - Please don't hesitate to let me know if you have additional questions or concerns before your next visit. I recommend directing your inquiries through MyChart. However, if you do not have MyChart access or prefer calling, the phone number is [phone], Option 4.   Urgent - For urgent, after-hours/weekend issues that are not emergencies, please call the [facility] at [phone]. Ask to have paged the dermatologist on call. You will receive a call back from the dermatologist.  Emergency - Please call 911 or go to the nearest Emergency Department for all medical emergencies.    Follow up scheduling for visits greater than 6 months in the future  Your name will be put on the call back list if your recommended follow up time is greater than 6 months' time from your current appointment. The schedule opens only 6 months in advance, and a representative from [facility] will call you around that time to get you scheduled within the requested timeframe. It may be helpful for you to put a reminder in your calendar so you will remember to be on the look out for that call.    Procedure scheduling  All procedures need to be cleared through your insurance. This can take 2-3 weeks, or sometimes longer. If you do not hear back from [facility] within 1 month from the date that we discussed that you need a procedure, please send a MyChart message or call.    Drew A. Emge, MD MSc FAAD  Assistant Professor - Dermatology  Department of Internal Medicine  [facility]

## 2022-07-16 NOTE — Patient Instructions
It was nice to see you today. Thank you for choosing to visit our clinic.      Your time is important and if you had to wait today, we do apologize. Our goal is to run exactly on time; however, on occasion, we get behind in clinic due to unexpected patient issues. Thank you for your patience.    General Instructions:  How to reach me: Please send a MyChart message to the Spine Center or leave a voicemail for my nurse, Cara ([phone].  How to get a medication refill: Please use the MyChart Refill request or contact your pharmacy directly to request medication refills. Please allow 72 business hours for request to be completed.  How to receive your test results: If you have signed up for MyChart, you will receive your test results and messages from me this way. Otherwise, you will get a phone call or letter. If you are expecting results and have not heard from my office within 2 weeks of your testing, please send a MyChart message or call my office.  Scheduling: Our scheduling phone number is [phone].  Appointment Reminders on your cell phone: Communication preferences can be managed in MyChart to ensure you receive important appointment notifications  Support for many chronic illnesses is available through [facility]: [facility] or [phone].  For questions on nights, weekends or holidays, call the Operator at [phone], and ask for the doctor on call for Anest[facility] Pain Management.      Again, thank you for coming in today.    ________________________________________________________________

## 2022-07-17 ENCOUNTER — Encounter: Admit: 2022-07-17 | Discharge: 2022-07-17 | Payer: Medicaid Other

## 2022-07-18 ENCOUNTER — Encounter: Admit: 2022-07-18 | Discharge: 2022-07-18 | Payer: Medicaid Other

## 2022-08-02 ENCOUNTER — Encounter: Admit: 2022-08-02 | Discharge: 2022-08-02 | Payer: Medicaid Other

## 2022-08-08 ENCOUNTER — Ambulatory Visit: Admit: 2022-08-08 | Discharge: 2022-08-08 | Payer: Medicaid Other

## 2022-08-08 ENCOUNTER — Encounter: Admit: 2022-08-08 | Discharge: 2022-08-08 | Payer: Medicaid Other

## 2022-08-08 DIAGNOSIS — M7918 Myalgia, other site: Secondary | ICD-10-CM

## 2022-08-08 DIAGNOSIS — M2550 Pain in unspecified joint: Secondary | ICD-10-CM

## 2022-08-08 DIAGNOSIS — M5030 Other cervical disc degeneration, unspecified cervical region: Secondary | ICD-10-CM

## 2022-08-08 DIAGNOSIS — R011 Cardiac murmur, unspecified: Secondary | ICD-10-CM

## 2022-08-08 DIAGNOSIS — M5134 Other intervertebral disc degeneration, thoracic region: Secondary | ICD-10-CM

## 2022-08-08 DIAGNOSIS — K219 Gastro-esophageal reflux disease without esophagitis: Secondary | ICD-10-CM

## 2022-08-08 DIAGNOSIS — Q0700 Arnold-Chiari syndrome without spina bifida or hydrocephalus: Secondary | ICD-10-CM

## 2022-08-08 DIAGNOSIS — R002 Palpitations: Secondary | ICD-10-CM

## 2022-08-08 DIAGNOSIS — M5136 Other intervertebral disc degeneration, lumbar region: Secondary | ICD-10-CM

## 2022-08-08 DIAGNOSIS — IMO0002 Chiari malformation: Secondary | ICD-10-CM

## 2022-08-08 DIAGNOSIS — J45909 Unspecified asthma, uncomplicated: Secondary | ICD-10-CM

## 2022-08-08 DIAGNOSIS — M479 Spondylosis, unspecified: Secondary | ICD-10-CM

## 2022-08-08 DIAGNOSIS — R52 Pain, unspecified: Secondary | ICD-10-CM

## 2022-08-08 DIAGNOSIS — R519 Generalized headaches: Secondary | ICD-10-CM

## 2022-08-08 DIAGNOSIS — M1990 Unspecified osteoarthritis, unspecified site: Secondary | ICD-10-CM

## 2022-08-08 DIAGNOSIS — R110 Nausea: Secondary | ICD-10-CM

## 2022-08-08 DIAGNOSIS — G43909 Migraine, unspecified, not intractable, without status migrainosus: Secondary | ICD-10-CM

## 2022-08-08 DIAGNOSIS — N920 Excessive and frequent menstruation with regular cycle: Secondary | ICD-10-CM

## 2022-08-08 DIAGNOSIS — I82409 Acute embolism and thrombosis of unspecified deep veins of unspecified lower extremity: Secondary | ICD-10-CM

## 2022-08-08 DIAGNOSIS — R079 Chest pain, unspecified: Secondary | ICD-10-CM

## 2022-08-08 DIAGNOSIS — F419 Anxiety disorder, unspecified: Secondary | ICD-10-CM

## 2022-08-08 DIAGNOSIS — J189 Pneumonia, unspecified organism: Secondary | ICD-10-CM

## 2022-08-08 DIAGNOSIS — I2699 Other pulmonary embolism without acute cor pulmonale: Secondary | ICD-10-CM

## 2022-08-08 DIAGNOSIS — E079 Disorder of thyroid, unspecified: Secondary | ICD-10-CM

## 2022-08-08 DIAGNOSIS — M5416 Radiculopathy, lumbar region: Secondary | ICD-10-CM

## 2022-08-08 DIAGNOSIS — R0789 Other chest pain: Secondary | ICD-10-CM

## 2022-08-08 DIAGNOSIS — S0990XA Unspecified injury of head, initial encounter: Secondary | ICD-10-CM

## 2022-08-08 DIAGNOSIS — K5732 Diverticulitis of large intestine without perforation or abscess without bleeding: Secondary | ICD-10-CM

## 2022-08-08 DIAGNOSIS — C801 Malignant (primary) neoplasm, unspecified: Secondary | ICD-10-CM

## 2022-08-08 DIAGNOSIS — F32A Depression: Secondary | ICD-10-CM

## 2022-08-08 DIAGNOSIS — F909 Attention-deficit hyperactivity disorder, unspecified type: Secondary | ICD-10-CM

## 2022-08-08 DIAGNOSIS — G935 Compression of brain: Secondary | ICD-10-CM

## 2022-08-08 DIAGNOSIS — G971 Other reaction to spinal and lumbar puncture: Secondary | ICD-10-CM

## 2022-08-08 DIAGNOSIS — M797 Fibromyalgia: Secondary | ICD-10-CM

## 2022-08-08 DIAGNOSIS — M549 Dorsalgia, unspecified: Secondary | ICD-10-CM

## 2022-08-08 DIAGNOSIS — E663 Overweight: Secondary | ICD-10-CM

## 2022-08-08 MED ORDER — TRIAMCINOLONE ACETONIDE 40 MG/ML IJ SUSP
80 mg | Freq: Once | EPIDURAL | 0 refills | Status: CP
Start: 2022-08-08 — End: ?
  Administered 2022-08-08: 16:00:00 80 mg via EPIDURAL

## 2022-08-08 MED ORDER — IOHEXOL 240 MG IODINE/ML IV SOLN
1 mL | Freq: Once | EPIDURAL | 0 refills | Status: CP
Start: 2022-08-08 — End: ?
  Administered 2022-08-08: 16:00:00 1 mL via EPIDURAL

## 2022-08-08 NOTE — Patient Instructions
Procedure Completed Today: Lumbar Epidural Steroid Injection L5-S1     Important information following your procedure today: You may drive today    Pain relief may not be immediate. It is possible you may even experience an increase in pain during the first 24-48 hours followed by a gradual decrease of your pain.  Though the procedure is generally safe and complications are rare, we do ask that you be aware of any of the following:   Any swelling, persistent redness, new bleeding, or drainage from the site of the injection.  You should not experience a severe headache.  You should not run a fever over 101? F.  New onset of sharp, severe back & or neck pain.  New onset of upper or lower extremity numbness or weakness.  New difficulty controlling bowel or bladder function after the injection.  New shortness of breath.    If any of these occur, please call to report this occurrence to Dr. Braun at [phone]. If you are calling after 4:00 p.m., on weekends or holidays please call [phone] and ask to have the resident physician on call for the physician paged or go to your local emergency room.  You may experience soreness at the injection site. Ice can be applied at 20 minute intervals. Avoid application of direct heat, hot showers or hot tubs today.  Avoid strenuous activity today. You may resume your regular activities and exercise tomorrow.  Patients with diabetes may see an elevation in blood sugars for 7-10 days after the injection. It is important to pay close attention to your diet, check your blood sugars daily and report extreme elevations to the physician that treats your diabetes.  Patients taking a daily blood thinner can resume their regular dose this evening.  It is important that you take all medications ordered by your pain physician. Taking medication as ordered is an important part of your pain care plan. If you cannot continue the medication plan, please notify the physician.     Possible side effects to steroids that may occur:  Flushing or redness of the face  Irritability  Fluid retention  Change in women?s menses    The following medications were used: Lidocaine , Triamcinolone  , and Contrast Dye

## 2022-08-08 NOTE — Procedures
Attending Surgeon: Gabriel Rung, MD    Anesthesia: Local    Pre-Procedure Diagnosis:   1. Lumbar radiculopathy    2. Myalgia, other site        Post-Procedure Diagnosis:   1. Lumbar radiculopathy    2. Myalgia, other site        Procedures  Estimated blood loss: none or minimal  Specimens: none  Patient tolerated the procedure well with no immediate complications. Pressure was applied, and hemostasis was accomplished.

## 2022-08-08 NOTE — Procedures
Attending Surgeon: Gabriel Rung, MD    Anesthesia: Local    Pre-Procedure Diagnosis:   1. Lumbar radiculopathy    2. Myalgia, other site        Post-Procedure Diagnosis:   1. Lumbar radiculopathy    2. Myalgia, other site        Pain Score: Seven    [facility] SPINE INJECT INTERLAM LMBR/SAC  Procedure: epidural - interlaminar    Laterality: n/a   on 08/08/2022 9:30 AM  Location: lumbar ESI with imaging - L5-S1      Consent:   Consent obtained: written  Consent given by: patient       Universal Protocol:  Relevant documents: relevant documents present and verified  Test results: test results available and properly labeled  Imaging studies: imaging studies available  Required items: required blood products, implants, devices, and special equipment available  Site marked: the operative site was marked  Patient identity confirmed: Patient identify confirmed verbally with patient.        Time out: Immediately prior to procedure a time out was called to verify the correct patient, procedure, equipment, support staff and site/side marked as required      Procedures Details:   Indications: pain   Prep: chlorhexidine  Patient position: prone  Specimens: none  Number of Levels: 1  Approach: midline  Guidance: fluoroscopy  Contrast: Procedure confirmed with contrast under live fluoroscopy.  Needle and Epidural Catheter: tuohy  Needle size: 18 G  Injection procedure: Negative aspiration for blood  Patient tolerance: Patient tolerated the procedure well with no immediate complications. Pressure was applied, and hemostasis was accomplished.  Comments: Kenalog 80 mg and normal saline 2 ml was injected    Administrations This Visit       iohexoL (OMNIPAQUE-240) 240 mg/mL injection 1 mL       Admin Date  08/08/2022 Action  Given Dose  1 mL Route  Epidural Documented By  Gabriel Rung, MD              triamcinolone acetonide [facility]) injection 80 mg       Admin Date  08/08/2022 Action  Given Dose  80 mg Route  Epidural Documented By  Gabriel Rung, MD                  Estimated blood loss: none or minimal  Specimens: none  Patient tolerated the procedure well with no immediate complications. Pressure was applied, and hemostasis was accomplished.

## 2022-08-08 NOTE — Progress Notes

## 2022-08-08 NOTE — Progress Notes
SPINE CENTER  INTERVENTIONAL PAIN PROCEDURE HISTORY AND PHYSICAL    Chief Complaint: Pain    HISTORY OF PRESENT ILLNESS:  Tina [name] presents for care regarding back and leg pain. This patient had at least 50% pain relief for at least 3 months with the last epidural injection. The pain score was 8/10 prior to the injection and 2/10 following the injection.     Past Medical History:   Diagnosis Date    Acid reflux     ADHD (attention deficit hyperactivity disorder)     Have had since I was a child    Anxiety     panic attacks    Arnold-Chiari malformation (HCC)     Arthritis     Asthma     Cancer (HCC)     cervical    Chest pain     associated with asthma?    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 Macario Carls in early 2000s    Chiari malformation     Dr. Danielle Dess did my surgery at [facility]    Chronic back pain 02/11/2009    Deep vein thrombosis (DVT) (HCC) 1996    right leg    Degenerative arthritis of spine     Degenerative disc disease, cervical     Degenerative disc disease, lumbar     Degenerative disc disease, thoracic     Depression     In the past    Diverticulitis of colon (without mention of hemorrhage)(562.11)     4 times    Fibromyalgia     Fibromyalgia     Generalized headaches     migraines    Head injury     Heart murmur     Was noticed during a test    HTN     Joint pain     Arthritis    Menorrhagia     Migraines     Nausea     Other chest pain 07/09/2018    Overweight(278.02)     Pain     back, neck, legs    Pneumonia     In the past    Pulmonary embolism (HCC)     My lags    Rapid palpitations 02/11/2009    Spinal headache     Chiar headache they start my neck and going to my head    Thyroid disorder        Surgical History:   Procedure Laterality Date    HYSTERECTOMY  2005    uterus + 1 ovary removed    HX KNEE ARTHROSCOPY Left Jan 2016    and Dec 2015    TOTAL THYROIDECTOMY, POSSIBLE CENTRAL NECK LYMPH NODE DISSECTION, INTRAOPERATIVE RECURRENT LARYNGEAL NERVE MONITORING, INTRAOPERATIVE PARATHYROID HORMONE MONITORING, PARATHYROIDECTOMY N/A 08/06/2014    Performed by Marcelle Overlie, MD at [facility] OR    UPPER GASTROINTESTINAL ENDOSCOPY  11/03/2015    HX CHOLECYSTECTOMY  04/29/2017    HX CESAREAN SECTION      HX HYSTERECTOMY      [facility] did it    HX KNEE ARTHROSCOPY Right 1990's    HX SURGERY      neck area, chiari malformation    HX SURGERY      abdominal sx    HX TUBAL LIGATION      KNEE SURGERY      Both knee have been scoped    OOPHORECTOMY      remaining ovary removed  family history includes Alcohol liver disease in her father, mother, and sister; Arthritis in her father, maternal grandfather, maternal grandmother, paternal grandmother, and sister; Arthritis-rheumatoid in an other family member; Back pain in her daughter, father, maternal grandfather, and paternal grandmother; Bleeding Disorders in her paternal grandmother and sister; Cancer in her father, maternal grandmother, paternal grandmother, and sister; Diabetes in her sister; Heart Attack in her paternal grandmother; Heart Failure in her father; Heart problem in her brother, maternal grandfather, and paternal grandmother; Hypertension in her brother, father, maternal grandfather, paternal grandmother, and sister; Hypotension in her sister; Joint Pain in her brother, father, and paternal grandmother; Mental Retardation in her sister; Migraines in her father and sister; Neck Pain in her daughter; Neurologic Disorder in her daughter; Osteoporosis in her brother, maternal grandmother, paternal grandmother, and sister; Other in an other family member; Seizures in an other family member; Stroke in her maternal grandfather, maternal grandmother, and paternal grandmother; Tremor in her sister.    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    Sexual activity: Not Currently     Partners: Male     Birth control/protection: None       Allergies   Allergen Reactions    Aspirin ANAPHYLAXIS    Ibuprofen ANAPHYLAXIS     Patient can tolerate 800 mg tablet    Pcn [Penicillins] ANAPHYLAXIS    Reglan [Metoclopramide] SEE COMMENTS     Severe leg shakiness    Adhesive Tape (Rosins) RASH    Sulfa (Sulfonamide Antibiotics) NAUSEA AND VOMITING    Omeprazole NAUSEA AND VOMITING    Prednisone NAUSEA AND VOMITING       Vitals:    08/08/22 0946   BP: 112/82   BP Source: Arm, Left Upper   Pulse: 86   Temp: 37.1 ?C (98.7 ?F)   Resp: 12   SpO2: 97%   TempSrc: Oral   PainSc: Seven     Pain Score: Seven  Oswestry Total Score:: 38    REVIEW OF SYSTEMS: 10 point ROS obtained and negative except back and leg pain      PHYSICAL EXAM:  General: Alert and oriented, very pleasant female.   HEENT showed extraocular muscles were intact and no other abnormalities.  Unlabored breathing.  Regular rate and rhythm on CV exam.   5/5 strength in bilateral upper and lower extremities.    Sensation is intact to light touch and equal in the upper and lower extremities.         IMPRESSION:    1. Lumbar radiculopathy    2. Myalgia, other site         PLAN: Lumbar Epidural Steroid Injection at L5-S1

## 2022-08-15 ENCOUNTER — Encounter: Admit: 2022-08-15 | Discharge: 2022-08-15 | Payer: Medicaid Other

## 2022-08-15 ENCOUNTER — Ambulatory Visit: Admit: 2022-08-15 | Discharge: 2022-08-16 | Payer: Medicaid Other

## 2022-08-15 DIAGNOSIS — M479 Spondylosis, unspecified: Secondary | ICD-10-CM

## 2022-08-15 DIAGNOSIS — K219 Gastro-esophageal reflux disease without esophagitis: Secondary | ICD-10-CM

## 2022-08-15 DIAGNOSIS — S0990XA Unspecified injury of head, initial encounter: Secondary | ICD-10-CM

## 2022-08-15 DIAGNOSIS — J45909 Unspecified asthma, uncomplicated: Secondary | ICD-10-CM

## 2022-08-15 DIAGNOSIS — M5134 Other intervertebral disc degeneration, thoracic region: Secondary | ICD-10-CM

## 2022-08-15 DIAGNOSIS — M797 Fibromyalgia: Secondary | ICD-10-CM

## 2022-08-15 DIAGNOSIS — Z9889 Other specified postprocedural states: Secondary | ICD-10-CM

## 2022-08-15 DIAGNOSIS — R0789 Other chest pain: Secondary | ICD-10-CM

## 2022-08-15 DIAGNOSIS — F909 Attention-deficit hyperactivity disorder, unspecified type: Secondary | ICD-10-CM

## 2022-08-15 DIAGNOSIS — F419 Anxiety disorder, unspecified: Secondary | ICD-10-CM

## 2022-08-15 DIAGNOSIS — I82409 Acute embolism and thrombosis of unspecified deep veins of unspecified lower extremity: Secondary | ICD-10-CM

## 2022-08-15 DIAGNOSIS — R110 Nausea: Secondary | ICD-10-CM

## 2022-08-15 DIAGNOSIS — R002 Palpitations: Secondary | ICD-10-CM

## 2022-08-15 DIAGNOSIS — R011 Cardiac murmur, unspecified: Secondary | ICD-10-CM

## 2022-08-15 DIAGNOSIS — M5030 Other cervical disc degeneration, unspecified cervical region: Secondary | ICD-10-CM

## 2022-08-15 DIAGNOSIS — J189 Pneumonia, unspecified organism: Secondary | ICD-10-CM

## 2022-08-15 DIAGNOSIS — I2699 Other pulmonary embolism without acute cor pulmonale: Secondary | ICD-10-CM

## 2022-08-15 DIAGNOSIS — M5136 Other intervertebral disc degeneration, lumbar region: Secondary | ICD-10-CM

## 2022-08-15 DIAGNOSIS — IMO0002 Chiari malformation: Secondary | ICD-10-CM

## 2022-08-15 DIAGNOSIS — C801 Malignant (primary) neoplasm, unspecified: Secondary | ICD-10-CM

## 2022-08-15 DIAGNOSIS — R52 Pain, unspecified: Secondary | ICD-10-CM

## 2022-08-15 DIAGNOSIS — N920 Excessive and frequent menstruation with regular cycle: Secondary | ICD-10-CM

## 2022-08-15 DIAGNOSIS — E663 Overweight: Secondary | ICD-10-CM

## 2022-08-15 DIAGNOSIS — M1990 Unspecified osteoarthritis, unspecified site: Secondary | ICD-10-CM

## 2022-08-15 DIAGNOSIS — E039 Hypothyroidism, unspecified: Secondary | ICD-10-CM

## 2022-08-15 DIAGNOSIS — R079 Chest pain, unspecified: Secondary | ICD-10-CM

## 2022-08-15 DIAGNOSIS — K5732 Diverticulitis of large intestine without perforation or abscess without bleeding: Secondary | ICD-10-CM

## 2022-08-15 DIAGNOSIS — Q0700 Arnold-Chiari syndrome without spina bifida or hydrocephalus: Secondary | ICD-10-CM

## 2022-08-15 DIAGNOSIS — M2550 Pain in unspecified joint: Secondary | ICD-10-CM

## 2022-08-15 DIAGNOSIS — G935 Compression of brain: Secondary | ICD-10-CM

## 2022-08-15 DIAGNOSIS — M549 Dorsalgia, unspecified: Secondary | ICD-10-CM

## 2022-08-15 DIAGNOSIS — R519 Generalized headaches: Secondary | ICD-10-CM

## 2022-08-15 DIAGNOSIS — G971 Other reaction to spinal and lumbar puncture: Secondary | ICD-10-CM

## 2022-08-15 DIAGNOSIS — E079 Disorder of thyroid, unspecified: Secondary | ICD-10-CM

## 2022-08-15 DIAGNOSIS — F32A Depression: Secondary | ICD-10-CM

## 2022-08-15 DIAGNOSIS — G43909 Migraine, unspecified, not intractable, without status migrainosus: Secondary | ICD-10-CM

## 2022-08-15 MED ORDER — LEVOTHYROXINE 112 MCG PO TAB
112 ug | ORAL_TABLET | Freq: Every day | ORAL | 3 refills | 30.00000 days | Status: AC
Start: 2022-08-15 — End: ?
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
## 2022-09-07 ENCOUNTER — Encounter: Admit: 2022-09-07 | Discharge: 2022-09-07 | Payer: Medicaid Other

## 2022-09-07 ENCOUNTER — Ambulatory Visit: Admit: 2022-09-07 | Discharge: 2022-09-07 | Payer: Medicaid Other

## 2022-09-07 DIAGNOSIS — R079 Chest pain, unspecified: Secondary | ICD-10-CM

## 2022-09-07 DIAGNOSIS — I2699 Other pulmonary embolism without acute cor pulmonale: Secondary | ICD-10-CM

## 2022-09-07 DIAGNOSIS — IMO0002 Chiari malformation: Secondary | ICD-10-CM

## 2022-09-07 DIAGNOSIS — M2550 Pain in unspecified joint: Secondary | ICD-10-CM

## 2022-09-07 DIAGNOSIS — H60393 Other infective otitis externa, bilateral: Secondary | ICD-10-CM

## 2022-09-07 DIAGNOSIS — R519 Generalized headaches: Secondary | ICD-10-CM

## 2022-09-07 DIAGNOSIS — E079 Disorder of thyroid, unspecified: Secondary | ICD-10-CM

## 2022-09-07 DIAGNOSIS — E663 Overweight: Secondary | ICD-10-CM

## 2022-09-07 DIAGNOSIS — M5136 Other intervertebral disc degeneration, lumbar region: Secondary | ICD-10-CM

## 2022-09-07 DIAGNOSIS — G43909 Migraine, unspecified, not intractable, without status migrainosus: Secondary | ICD-10-CM

## 2022-09-07 DIAGNOSIS — M479 Spondylosis, unspecified: Secondary | ICD-10-CM

## 2022-09-07 DIAGNOSIS — R0789 Other chest pain: Secondary | ICD-10-CM

## 2022-09-07 DIAGNOSIS — M5134 Other intervertebral disc degeneration, thoracic region: Secondary | ICD-10-CM

## 2022-09-07 DIAGNOSIS — M5030 Other cervical disc degeneration, unspecified cervical region: Secondary | ICD-10-CM

## 2022-09-07 DIAGNOSIS — G935 Compression of brain: Secondary | ICD-10-CM

## 2022-09-07 DIAGNOSIS — R110 Nausea: Secondary | ICD-10-CM

## 2022-09-07 DIAGNOSIS — F32A Depression: Secondary | ICD-10-CM

## 2022-09-07 DIAGNOSIS — R011 Cardiac murmur, unspecified: Secondary | ICD-10-CM

## 2022-09-07 DIAGNOSIS — S0990XA Unspecified injury of head, initial encounter: Secondary | ICD-10-CM

## 2022-09-07 DIAGNOSIS — C801 Malignant (primary) neoplasm, unspecified: Secondary | ICD-10-CM

## 2022-09-07 DIAGNOSIS — I82409 Acute embolism and thrombosis of unspecified deep veins of unspecified lower extremity: Secondary | ICD-10-CM

## 2022-09-07 DIAGNOSIS — M1990 Unspecified osteoarthritis, unspecified site: Secondary | ICD-10-CM

## 2022-09-07 DIAGNOSIS — F419 Anxiety disorder, unspecified: Secondary | ICD-10-CM

## 2022-09-07 DIAGNOSIS — J189 Pneumonia, unspecified organism: Secondary | ICD-10-CM

## 2022-09-07 DIAGNOSIS — J45909 Unspecified asthma, uncomplicated: Secondary | ICD-10-CM

## 2022-09-07 DIAGNOSIS — R002 Palpitations: Secondary | ICD-10-CM

## 2022-09-07 DIAGNOSIS — R52 Pain, unspecified: Secondary | ICD-10-CM

## 2022-09-07 DIAGNOSIS — M549 Dorsalgia, unspecified: Secondary | ICD-10-CM

## 2022-09-07 DIAGNOSIS — F909 Attention-deficit hyperactivity disorder, unspecified type: Secondary | ICD-10-CM

## 2022-09-07 DIAGNOSIS — K5732 Diverticulitis of large intestine without perforation or abscess without bleeding: Secondary | ICD-10-CM

## 2022-09-07 DIAGNOSIS — M797 Fibromyalgia: Secondary | ICD-10-CM

## 2022-09-07 DIAGNOSIS — Q0700 Arnold-Chiari syndrome without spina bifida or hydrocephalus: Secondary | ICD-10-CM

## 2022-09-07 DIAGNOSIS — K219 Gastro-esophageal reflux disease without esophagitis: Secondary | ICD-10-CM

## 2022-09-07 DIAGNOSIS — G971 Other reaction to spinal and lumbar puncture: Secondary | ICD-10-CM

## 2022-09-07 DIAGNOSIS — N920 Excessive and frequent menstruation with regular cycle: Secondary | ICD-10-CM

## 2022-09-07 NOTE — Progress Notes
Date of Service: 09/07/2022    Subjective:             Tina [name] is a 52 y.o. female.    History of Present Illness  She is here for drainage in her ears.  She continues to have intermittent drainage.  He was placed on eardrops by her PCP because the ear canal was swollen shut.  She does wear hearing aids.  Left ear may be worse than the right.             Objective:         DULoxetine (DRIZALMA SPRINKLE) 30 mg capsule,delayed release sprinkle     EPINEPHrine(+) (EPIPEN) 1 mg/mL injection pen Inject 0.3 mL into the muscle once as needed.    furosemide (LASIX) 20 mg tablet Take one tablet by mouth daily.    levothyroxine (SYNTHROID) 112 mcg tablet Take one tablet by mouth daily 30 minutes before breakfast.    ondansetron (ZOFRAN ODT) 4 mg rapid dissolve tablet     phentermine (ADIPEX-P) 37.5 mg tablet Take one tablet by mouth daily.    WEGOVY 2.4 mg/0.75 mL injector PEN Inject 2.4 mg under the skin every 7 days.     Vitals:    09/07/22 0918   BP: 124/84   Pulse: 71   Temp: 36.7 ?C (98.1 ?F)   PainSc: Zero   Weight: 83.9 kg (185 lb)   Height: 170.2 cm (5' 7)     Body mass index is 28.98 kg/m?Marland Kitchen     Physical Exam     Constitutional: She appears well-developed and well-nourished.   HENT:   Normal voice  Head: Normocephalic and atraumatic.   Right Ear: While visualized under the microscope there is a small tympanic membrane, external ear and ear canal normal. No drainage, swelling or tenderness. No middle ear effusion.   Left Ear: While visualized under the microscope the canal was normal.  No significant cerumen.  No evidence of area of infection.  Tympanic membrane, external ear and ear canal normal. No drainage, swelli while visualized under the microscope ng or tenderness.  No middle ear effusion.   Eyes: EOM and lids are normal.   Neck: Trachea normal, normal range of motion and phonation normal. No thyromegaly present.   No adenopathy.  Salivary Glands: normal  Cranial nerves: 2-12 normal      Testing: Not done today         Assessment and Plan:  52 year old here for what sounds like recurrent external otitis.  This is being more recent problem.  Will have her try drying agents such as using the hair dryer and possibly also half-and-half and agreed alcohol as a preventative measure.  She may also be having difficulty with her hearing aids will have her checked out as well.

## 2022-09-07 NOTE — Patient Instructions
Dry ears after bathing  Can also try using Half &half white vinegar and rubbing alcohol

## 2022-09-11 ENCOUNTER — Encounter: Admit: 2022-09-11 | Discharge: 2022-09-11 | Payer: Medicaid Other

## 2022-09-11 ENCOUNTER — Ambulatory Visit: Admit: 2022-09-11 | Discharge: 2022-09-11 | Payer: Medicaid Other

## 2022-09-11 DIAGNOSIS — H903 Sensorineural hearing loss, bilateral: Secondary | ICD-10-CM

## 2022-09-11 NOTE — Progress Notes
Tina [name] was seen today for a hearing aid check. She reported that everything sounds like she is in a tunnel. This occurs for her own voice as well as other voices/sounds.    Both aids were cleaned with filters and domes replaced. Settings were confirmed and found to be set to an older audiogram. This was recalculated. Low frequency gain was rolled off and high frequency gain was increased to patient comfort. Microphone mode changed to Softswitching from monitor and focus ear. Tina [name] reported improved sound in the exam room.     Aids were paired to Tina [name]. She was shown app use and instructed on streaming audio.     Tina [name] should be seen at our [facility] location for insurance reasons moving forward. She was informed of this and she expressed understanding.    Manufacturer: [employer]  Model: Key 4 [id number]  Serial Numbers: Right: [id number]  Left: [id number]  Receiver Length: Right: 0  Left: 0  Receiver Strength: MP  Repair Warranty: 01/15/2024  L&D Warranty: 01/15/2024  Battery: Rechargeable [id number]  Domes: [name]  Programs: 1. Automatic 2. Hear in noise  Volume: Buttons (Right- raise Left-lower)   App control  Fit Date: 02/03/2021  Purchase Type: [employer]

## 2022-10-09 ENCOUNTER — Encounter: Admit: 2022-10-09 | Discharge: 2022-10-09 | Payer: Medicaid Other

## 2022-10-10 ENCOUNTER — Encounter: Admit: 2022-10-10 | Discharge: 2022-10-10 | Payer: Medicaid Other

## 2022-10-10 NOTE — Telephone Encounter
Tina [name], PT with [facility] saying he is working with patient. She has an appt Aug 9th. He needs to send a letter to Dr. Assunta Curtis and needs his direct fax number. He was told Dr. Assunta Curtis is no longer at the number he used before. He also needs to  confirm that patient will see Dr. Assunta Curtis at the [facility] on Aug 9th. He requested call back to [phone].    Spoke to [employer] and provided him with fax number to send letter to and RN's direct number. Also verified patient will be seen here at [facility] Aug. 9th.

## 2022-10-11 ENCOUNTER — Encounter: Admit: 2022-10-11 | Discharge: 2022-10-11 | Payer: Medicaid Other

## 2022-10-11 DIAGNOSIS — M5412 Radiculopathy, cervical region: Secondary | ICD-10-CM

## 2022-10-11 NOTE — Telephone Encounter
Received letter from Eulis Canner, PT, with [facility] requesting Dr. 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 Discussed with Dr. Samara Deist. He would like PCP to take care of the vertebral artery compression but said he would put in an order for cervical flexion/extension xray to evaluate further. Order placed and patient informed of this.

## 2022-10-12 ENCOUNTER — Ambulatory Visit: Admit: 2022-10-12 | Discharge: 2022-10-12 | Payer: Medicaid Other

## 2022-10-12 ENCOUNTER — Encounter: Admit: 2022-10-12 | Discharge: 2022-10-12 | Payer: Medicaid Other

## 2022-10-12 DIAGNOSIS — E079 Disorder of thyroid, unspecified: Secondary | ICD-10-CM

## 2022-10-12 DIAGNOSIS — R110 Nausea: Secondary | ICD-10-CM

## 2022-10-12 DIAGNOSIS — IMO0002 Chiari malformation: Secondary | ICD-10-CM

## 2022-10-12 DIAGNOSIS — R55 Syncope and collapse: Secondary | ICD-10-CM

## 2022-10-12 DIAGNOSIS — N920 Excessive and frequent menstruation with regular cycle: Secondary | ICD-10-CM

## 2022-10-12 DIAGNOSIS — F909 Attention-deficit hyperactivity disorder, unspecified type: Secondary | ICD-10-CM

## 2022-10-12 DIAGNOSIS — E663 Overweight: Secondary | ICD-10-CM

## 2022-10-12 DIAGNOSIS — R0789 Other chest pain: Secondary | ICD-10-CM

## 2022-10-12 DIAGNOSIS — M797 Fibromyalgia: Secondary | ICD-10-CM

## 2022-10-12 DIAGNOSIS — M5030 Other cervical disc degeneration, unspecified cervical region: Secondary | ICD-10-CM

## 2022-10-12 DIAGNOSIS — K5732 Diverticulitis of large intestine without perforation or abscess without bleeding: Secondary | ICD-10-CM

## 2022-10-12 DIAGNOSIS — M5134 Other intervertebral disc degeneration, thoracic region: Secondary | ICD-10-CM

## 2022-10-12 DIAGNOSIS — G935 Compression of brain: Secondary | ICD-10-CM

## 2022-10-12 DIAGNOSIS — I82409 Acute embolism and thrombosis of unspecified deep veins of unspecified lower extremity: Secondary | ICD-10-CM

## 2022-10-12 DIAGNOSIS — R011 Cardiac murmur, unspecified: Secondary | ICD-10-CM

## 2022-10-12 DIAGNOSIS — R519 Generalized headaches: Secondary | ICD-10-CM

## 2022-10-12 DIAGNOSIS — M1990 Unspecified osteoarthritis, unspecified site: Secondary | ICD-10-CM

## 2022-10-12 DIAGNOSIS — M479 Spondylosis, unspecified: Secondary | ICD-10-CM

## 2022-10-12 DIAGNOSIS — M2550 Pain in unspecified joint: Secondary | ICD-10-CM

## 2022-10-12 DIAGNOSIS — M549 Dorsalgia, unspecified: Secondary | ICD-10-CM

## 2022-10-12 DIAGNOSIS — C801 Malignant (primary) neoplasm, unspecified: Secondary | ICD-10-CM

## 2022-10-12 DIAGNOSIS — S0990XA Unspecified injury of head, initial encounter: Secondary | ICD-10-CM

## 2022-10-12 DIAGNOSIS — M5136 Other intervertebral disc degeneration, lumbar region: Secondary | ICD-10-CM

## 2022-10-12 DIAGNOSIS — R002 Palpitations: Secondary | ICD-10-CM

## 2022-10-12 DIAGNOSIS — G971 Other reaction to spinal and lumbar puncture: Secondary | ICD-10-CM

## 2022-10-12 DIAGNOSIS — G43909 Migraine, unspecified, not intractable, without status migrainosus: Secondary | ICD-10-CM

## 2022-10-12 DIAGNOSIS — J45909 Unspecified asthma, uncomplicated: Secondary | ICD-10-CM

## 2022-10-12 DIAGNOSIS — Q0700 Arnold-Chiari syndrome without spina bifida or hydrocephalus: Secondary | ICD-10-CM

## 2022-10-12 DIAGNOSIS — J189 Pneumonia, unspecified organism: Secondary | ICD-10-CM

## 2022-10-12 DIAGNOSIS — K219 Gastro-esophageal reflux disease without esophagitis: Secondary | ICD-10-CM

## 2022-10-12 DIAGNOSIS — I2699 Other pulmonary embolism without acute cor pulmonale: Secondary | ICD-10-CM

## 2022-10-12 DIAGNOSIS — F419 Anxiety disorder, unspecified: Secondary | ICD-10-CM

## 2022-10-12 DIAGNOSIS — R079 Chest pain, unspecified: Secondary | ICD-10-CM

## 2022-10-12 DIAGNOSIS — R52 Pain, unspecified: Secondary | ICD-10-CM

## 2022-10-12 DIAGNOSIS — F32A Depression: Secondary | ICD-10-CM

## 2022-10-12 DIAGNOSIS — G629 Polyneuropathy, unspecified: Secondary | ICD-10-CM

## 2022-10-24 ENCOUNTER — Encounter: Admit: 2022-10-24 | Discharge: 2022-10-24 | Payer: Medicaid Other

## 2022-10-30 ENCOUNTER — Encounter: Admit: 2022-10-30 | Discharge: 2022-10-30 | Payer: Medicaid Other

## 2022-11-02 ENCOUNTER — Encounter: Admit: 2022-11-02 | Discharge: 2022-11-02 | Payer: Medicaid Other

## 2022-11-07 ENCOUNTER — Encounter: Admit: 2022-11-07 | Discharge: 2022-11-07 | Payer: Medicaid Other

## 2022-11-08 ENCOUNTER — Encounter: Admit: 2022-11-08 | Discharge: 2022-11-08 | Payer: Medicaid Other

## 2022-11-12 ENCOUNTER — Encounter: Admit: 2022-11-12 | Discharge: 2022-11-12 | Payer: Medicaid Other

## 2022-11-12 ENCOUNTER — Ambulatory Visit: Admit: 2022-11-12 | Discharge: 2022-11-12 | Payer: Medicaid Other

## 2022-11-12 DIAGNOSIS — M479 Spondylosis, unspecified: Secondary | ICD-10-CM

## 2022-11-12 DIAGNOSIS — C801 Malignant (primary) neoplasm, unspecified: Secondary | ICD-10-CM

## 2022-11-12 DIAGNOSIS — E079 Disorder of thyroid, unspecified: Secondary | ICD-10-CM

## 2022-11-12 DIAGNOSIS — R002 Palpitations: Secondary | ICD-10-CM

## 2022-11-12 DIAGNOSIS — M1990 Unspecified osteoarthritis, unspecified site: Secondary | ICD-10-CM

## 2022-11-12 DIAGNOSIS — R0789 Other chest pain: Secondary | ICD-10-CM

## 2022-11-12 DIAGNOSIS — M5416 Radiculopathy, lumbar region: Secondary | ICD-10-CM

## 2022-11-12 DIAGNOSIS — M5030 Other cervical disc degeneration, unspecified cervical region: Secondary | ICD-10-CM

## 2022-11-12 DIAGNOSIS — I82409 Acute embolism and thrombosis of unspecified deep veins of unspecified lower extremity: Secondary | ICD-10-CM

## 2022-11-12 DIAGNOSIS — M5134 Other intervertebral disc degeneration, thoracic region: Secondary | ICD-10-CM

## 2022-11-12 DIAGNOSIS — M7918 Myalgia, other site: Secondary | ICD-10-CM

## 2022-11-12 DIAGNOSIS — I2699 Other pulmonary embolism without acute cor pulmonale: Secondary | ICD-10-CM

## 2022-11-12 DIAGNOSIS — M797 Fibromyalgia: Secondary | ICD-10-CM

## 2022-11-12 DIAGNOSIS — N920 Excessive and frequent menstruation with regular cycle: Secondary | ICD-10-CM

## 2022-11-12 DIAGNOSIS — E663 Overweight: Secondary | ICD-10-CM

## 2022-11-12 DIAGNOSIS — K219 Gastro-esophageal reflux disease without esophagitis: Secondary | ICD-10-CM

## 2022-11-12 DIAGNOSIS — G971 Other reaction to spinal and lumbar puncture: Secondary | ICD-10-CM

## 2022-11-12 DIAGNOSIS — M5412 Radiculopathy, cervical region: Secondary | ICD-10-CM

## 2022-11-12 DIAGNOSIS — G43909 Migraine, unspecified, not intractable, without status migrainosus: Secondary | ICD-10-CM

## 2022-11-12 DIAGNOSIS — F909 Attention-deficit hyperactivity disorder, unspecified type: Secondary | ICD-10-CM

## 2022-11-12 DIAGNOSIS — G935 Compression of brain: Secondary | ICD-10-CM

## 2022-11-12 DIAGNOSIS — M5136 Other intervertebral disc degeneration, lumbar region: Secondary | ICD-10-CM

## 2022-11-12 DIAGNOSIS — IMO0002 Chiari malformation: Secondary | ICD-10-CM

## 2022-11-12 DIAGNOSIS — J189 Pneumonia, unspecified organism: Secondary | ICD-10-CM

## 2022-11-12 DIAGNOSIS — R519 Generalized headaches: Secondary | ICD-10-CM

## 2022-11-12 DIAGNOSIS — F419 Anxiety disorder, unspecified: Secondary | ICD-10-CM

## 2022-11-12 DIAGNOSIS — R079 Chest pain, unspecified: Secondary | ICD-10-CM

## 2022-11-12 DIAGNOSIS — J45909 Unspecified asthma, uncomplicated: Secondary | ICD-10-CM

## 2022-11-12 DIAGNOSIS — M2550 Pain in unspecified joint: Secondary | ICD-10-CM

## 2022-11-12 DIAGNOSIS — F32A Depression: Secondary | ICD-10-CM

## 2022-11-12 DIAGNOSIS — R011 Cardiac murmur, unspecified: Secondary | ICD-10-CM

## 2022-11-12 DIAGNOSIS — R52 Pain, unspecified: Secondary | ICD-10-CM

## 2022-11-12 DIAGNOSIS — K5732 Diverticulitis of large intestine without perforation or abscess without bleeding: Secondary | ICD-10-CM

## 2022-11-12 DIAGNOSIS — M549 Dorsalgia, unspecified: Secondary | ICD-10-CM

## 2022-11-12 DIAGNOSIS — Q0700 Arnold-Chiari syndrome without spina bifida or hydrocephalus: Secondary | ICD-10-CM

## 2022-11-12 DIAGNOSIS — R110 Nausea: Secondary | ICD-10-CM

## 2022-11-12 DIAGNOSIS — S0990XA Unspecified injury of head, initial encounter: Secondary | ICD-10-CM

## 2022-11-12 MED ORDER — IOHEXOL 240 MG IODINE/ML IV SOLN
1 mL | Freq: Once | EPIDURAL | 0 refills | Status: CP
Start: 2022-11-12 — End: ?
  Administered 2022-11-12: 15:00:00 1 mL via EPIDURAL

## 2022-11-12 MED ORDER — TRIAMCINOLONE ACETONIDE 40 MG/ML IJ SUSP
80 mg | Freq: Once | EPIDURAL | 0 refills | Status: CP
Start: 2022-11-12 — End: ?
  Administered 2022-11-12: 15:00:00 80 mg via EPIDURAL

## 2022-11-12 NOTE — Progress Notes

## 2022-11-17 ENCOUNTER — Encounter: Admit: 2022-11-17 | Discharge: 2022-11-17 | Payer: Medicaid Other

## 2022-11-17 ENCOUNTER — Inpatient Hospital Stay: Admit: 2022-11-17 | Discharge: 2022-11-17 | Payer: Medicaid Other

## 2022-11-17 DIAGNOSIS — F8081 Childhood onset fluency disorder: Secondary | ICD-10-CM

## 2022-11-17 DIAGNOSIS — I639 Cerebral infarction, unspecified: Secondary | ICD-10-CM

## 2022-11-17 DIAGNOSIS — S069XAS Traumatic brain injury, with unknown loss of consciousness status, sequela (HCC): Secondary | ICD-10-CM

## 2022-11-17 LAB — CBC AND DIFF
ABSOLUTE BASO COUNT: 0 10*3/uL (ref 0–0.20)
ABSOLUTE EOS COUNT: 0.1 10*3/uL (ref 0–0.45)
ABSOLUTE LYMPH COUNT: 1.9 10*3/uL (ref 1.0–4.8)
ABSOLUTE MONO COUNT: 0.6 10*3/uL (ref 0–0.80)
ABSOLUTE NEUTROPHIL: 7.3 10*3/uL — ABNORMAL HIGH (ref 1.8–7.0)
BASOPHILS %: 1 % (ref 0–2)
EOSINOPHILS %: 2 % (ref 0–5)
HEMATOCRIT: 42 % — ABNORMAL LOW (ref 36–45)
LYMPHOCYTES %: 19 % — ABNORMAL LOW (ref 24–44)
MCH: 30 pg (ref 26–34)
MCHC: 33 g/dL (ref 32.0–36.0)
MCV: 90 FL (ref 80–100)
MPV: 7.8 FL (ref 7–11)
NEUTROPHILS %: 72 % (ref 41–77)
PLATELET COUNT: 243 10*3/uL (ref 150–400)
RBC COUNT: 4.6 M/UL (ref 4.0–5.0)
RDW: 13 % (ref 11–15)
WBC COUNT: 10 10*3/uL (ref 4.5–11.0)

## 2022-11-17 LAB — PROTIME INR (PT): PROTIME: 9.9 s — ABNORMAL LOW (ref 10.2–12.9)

## 2022-11-17 MED ORDER — LEVOTHYROXINE 112 MCG PO TAB
112 ug | Freq: Every day | ORAL | 0 refills | Status: DC
Start: 2022-11-17 — End: 2022-11-19
  Administered 2022-11-18: 11:00:00 112 ug via ORAL

## 2022-11-17 MED ORDER — POTASSIUM CHLORIDE 20 MEQ/15 ML PO LIQD
40-60 meq | NASOGASTRIC | 0 refills | Status: DC | PRN
Start: 2022-11-17 — End: 2022-11-19

## 2022-11-17 MED ORDER — MAGNESIUM SULFATE IN D5W 1 GRAM/100 ML IV PGBK
1 g | INTRAVENOUS | 0 refills | Status: DC | PRN
Start: 2022-11-17 — End: 2022-11-19
  Administered 2022-11-18: 14:00:00 1 g via INTRAVENOUS

## 2022-11-17 MED ORDER — METHOCARBAMOL 500 MG PO TAB
1000 mg | Freq: Three times a day (TID) | ORAL | 0 refills | Status: DC
Start: 2022-11-17 — End: 2022-11-19
  Administered 2022-11-18 (×3): 1000 mg via ORAL

## 2022-11-17 MED ORDER — BUDESONIDE-FORMOTEROL 80-4.5 MCG/ACTUATION IN HFAA
2 | Freq: Two times a day (BID) | RESPIRATORY_TRACT | 0 refills | Status: DC
Start: 2022-11-17 — End: 2022-11-18
  Administered 2022-11-18: 01:00:00 2 via RESPIRATORY_TRACT

## 2022-11-17 MED ORDER — LORAZEPAM 2 MG/ML IJ SOLN GROUP
1 mg | Freq: Once | INTRAVENOUS | 0 refills | Status: CP
Start: 2022-11-17 — End: ?

## 2022-11-17 MED ORDER — CALCIUM GLUC IN NACL, ISO-OSM 1 GRAM/100 ML IV SOLN
1 g | INTRAVENOUS | 0 refills | Status: DC | PRN
Start: 2022-11-17 — End: 2022-11-19

## 2022-11-17 MED ORDER — DULOXETINE 30 MG PO CPDR
30 mg | Freq: Every day | ORAL | 0 refills | Status: DC
Start: 2022-11-17 — End: 2022-11-17

## 2022-11-17 MED ORDER — POTASSIUM CHLORIDE IN WATER 10 MEQ/50 ML IV PGBK
10 meq | INTRAVENOUS | 0 refills | Status: DC | PRN
Start: 2022-11-17 — End: 2022-11-19

## 2022-11-17 MED ORDER — POTASSIUM CHLORIDE 20 MEQ PO TBTQ
40-60 meq | ORAL | 0 refills | Status: DC | PRN
Start: 2022-11-17 — End: 2022-11-19

## 2022-11-17 MED ORDER — PRAZOSIN 1 MG PO CAP
1 mg | Freq: Every evening | ORAL | 0 refills | Status: CN
Start: 2022-11-17 — End: ?

## 2022-11-17 MED ORDER — LORAZEPAM 2 MG/ML IJ SOLN GROUP
1 mg | Freq: Once | INTRAVENOUS | 0 refills | Status: DC | PRN
Start: 2022-11-17 — End: 2022-11-18

## 2022-11-17 MED ORDER — ALBUTEROL SULFATE 2.5 MG /3 ML (0.083 %) IN NEBU
2.5 mg | RESPIRATORY_TRACT | 0 refills | Status: DC | PRN
Start: 2022-11-17 — End: 2022-11-19

## 2022-11-17 MED ORDER — METHOCARBAMOL 750 MG PO TAB
750 mg | Freq: Two times a day (BID) | ORAL | 0 refills | Status: DC
Start: 2022-11-17 — End: 2022-11-18
  Administered 2022-11-17: 22:00:00 750 mg via ORAL

## 2022-11-17 MED ORDER — METHOCARBAMOL 750 MG PO TAB
750 mg | Freq: Two times a day (BID) | ORAL | 0 refills | Status: DC
Start: 2022-11-17 — End: 2022-11-17

## 2022-11-17 MED ORDER — DIAZEPAM 5 MG/ML IJ SYRG
2.5 mg | Freq: Once | INTRAVENOUS | 0 refills | Status: CP
Start: 2022-11-17 — End: ?
  Administered 2022-11-18: 03:00:00 2.5 mg via INTRAVENOUS

## 2022-11-17 MED ORDER — PRAZOSIN 1 MG PO CAP
1 mg | Freq: Every evening | ORAL | 0 refills | Status: DC
Start: 2022-11-17 — End: 2022-11-19
  Administered 2022-11-18: 05:00:00 1 mg via ORAL

## 2022-11-17 NOTE — Acute Stroke Response
RN Stroke Activation Summary    Date of Service:  11/17/2022  Tina [name] is a 52 y.o.  female.     DOB: Mar 01, 1971                  MRN#:  [id number]  Allergies:  Aspirin, Ibuprofen, Pcn [penicillins], Reglan [metoclopramide], Adhesive tape (rosins), Sulfa (sulfonamide antibiotics), Omeprazole, and Prednisone       ASRT Arrival: 1442  Location of Response : [facility]    Page Received: 1442  EMS Agency: [facility]       Clinical Presentation: Right sided weakness    Total Stroke Scale Score: 2    Signs & Symptoms: Last Known Well  Last Known Well - Date: 11/17/22  Last Known Well - Time: 1000     Dysphagia screen:  Patient Intubated?: No  Did Patient Pass The Swallow Screen Part I?: No  If No Name of Physician Notified: Anette Riedel MD  Did Patient Pass The Swallow Screen Part II?: Yes     Did Patient Pass The Swallow Screen?: Yes       Plan:    Summary:  Patient is a candidate for IV thrombolytic intervention.  See Acute Stroke Response Provider Note for more information. TNK Received at OSH at 1259 PM    Radiology/Interventional Delay  Decision to IR: No  Delays: No      No current facility-administered medications for this encounter.    N/A  IV Thrombolytic in the Scanner: No    Plan: Admit to CA5    Call Completion: 1500    RN handoff: Tina Marrow, RN       Tina Devoid, RN
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
## 2022-11-17 NOTE — Progress Notes
RT Adult Assessment Note    NAME:Tina [name]             MRN: [id number]             DOB:December 23, 1970          AGE: 52 y.o.  ADMISSION DATE: 11/17/2022             DAYS ADMITTED: LOS: 0 days    Additional Comments:  Impressions of the patient: Pt resting in bed on RA, in NAD distress. She reports no difficulty breathing. No concerns from a respiratory standpoint at this time. Replacements for home medications ordered.    Vital Signs:  Pulse: 68  RR: 19 PER MINUTE  SpO2: 97 %  O2 Device: None (Room air)  Liter Flow:    O2%:      Breath Sounds:   All Breath Sounds: Clear (Implies normal)  Respiratory Effort:   Respiratory WDL: Within Defined Limits  Comments:

## 2022-11-17 NOTE — Acute Stroke Response
Name: Tina [name]   MRN: [id number]     DOB: [birth year]      Age: 52 y.o.  Admission Date: (Not on file)     LOS: 0 days     Date of Service: 11/17/2022       Allergies:  Aspirin, Ibuprofen, Pcn [penicillins], Reglan [metoclopramide], Adhesive tape (rosins), Sulfa (sulfonamide antibiotics), Omeprazole, and Prednisone    Type of Acute Stroke Response Team note: Consult    Stroke Activation Tier: Tier 1       Assessment & Plan    Chief Complaint: RUE weakness, aphasia  Assessment: Tina [name] is a 52 y.o. female with h/o thyroid disorder, migraines, fibromyalgia, depression/anxiety, DDD, chiari malformation presented with RUE weakness and aphasia to [facility], LKW @ 1000.  NIHSS 5 for RUE weakness, language and dysarthria.  CT head negative for acute process, TNK was administered at 1259.  CTA H/N negative for LVO.  She was transferred to [facility] for post TNK monitoring and admitted to the Neuro ICU.  NIHSS 2 on admission, patient exam stable.     Impression: Possible L MCA thrombus of unknown origin    Suspected localization of Stroke Sx: L MCA    Suspected etiology: Cardio Embolism Have you consulted cardiology? No     Pre-event Modified Rankin Scale (mRS): 0 - No symptoms at all     Plan:   - Admit to neuro ICU on telemetry   - q1 hr neuro checks   - Treat headache accordingly  - ok to hold ASA due to allergy  - MRI head without contrast  - Stroke risk factor assessment    Labs to include FLP, A1c   Echocardiogram    Telemetry to monitor for arrhythmia    Evaluation of smoking history and smoking cessation consult if tobaccoism present   - CBC, BMP routine   - PT/OT/SLP consult eval and treat   - Rehab consult for assessment of post stroke care     FEN: NPO until speech eval, NS @ 100 as pt received contrast media     PPX: SCDs and Lovenox for DVT prevention     The patient was seen and discussed with Dr. Vickey Huger    History of Present Illness    History of Present Illness: 52 y.o. female with h/o thyroid disorder, migraines, fibromyalgia, depression/anxiety, DDD, chiari malformation presented with RUE weakness and aphasia to [facility] hospital, LKW @ 1000.  NIHSS 5 for RUE weakness, language and dysarthria.  CT head negative for acute process, TNK was administered at 1259.  CTA H/N negative for LVO.  She was transferred to [facility] for post TNK monitoring and admitted to the Neuro ICU.  NIHSS 2 on admission, patient exam stable.     Review of Systems    All other systems reviewed and are negative.    Stroke Activation Summary                              CT/CTP/CTA: negative for severe stenosis or LVO @ OSH       NIHSS Completed at: 1455      NIH Stroke Scale Item  Scoring Definition  Score    1a. LOC  0=alert and responsive   1=arousable to minor stimulation   2=arousable only to painful stimulation   3=reflex responses or unrousable  0    1b. LOC questions-as patient?s age and month. Must be exact.  0=both correct   1=one correct (or dysarthria, intubated, foreign language)   2=neither correct  0    1c. Commands-open/close eyes, grip and release non-paretic hand (other 1 step commands or mimic OK)  0=both correct (ok if impaired by weakness)   1=one correct   2=neither correct  0    2. Best Gaze-horizontal EOM by voluntary or Doll?s  0=normal   1=partial gaze palsy (abnormal gaze in one or both eyes)   2=forced eye deviation or total paresis which cannot be overcome by Doll?s  0    3. Visual Field-use visual threat if necessary. If monocular, score field of good eye  0=no visual loss   1=partial hemianopia, quadrantanopia, extinction   2=complete hemianopia   3=bilateral hemianopia or blindness  0    4. Facial Palsy-if stuporous, check symmetry of grimace to pain  0=normal   1=minor paralysis, flat NLF, asymm smile   2=partial paralysis (lower face=UMN)   3=complete paralysis (upper and lower face)  0    5. Motor Arm-arms outstretched 90 deg (sitting) or 45 deg (supine) for 10 seconds. Encourage best effort. 0=no drift x 10 seconds   1=drift but doesn?t hit bed   2=some antigravity effort, but can?t sustain   3=no antigravity effort, but even minimal mvt counts   4=no movement at all   X=unable to assess due to amputation, fusion, etc  L/R   0/1    6. Motor Leg-raise leg to 30 degrees supine x 5 seconds  0=no drift x 5 seconds   1=drift but doesn?t hit bed   2=some antigravity effort, but can?t sustain   3=no antigravity effort, but even minimal mvt counts   4=no movement at all   X=unable to assess due to amputation, fusion, etc  L/R   0/1    7. Limb Ataxia-check finger-nose- finger; heel-shin; and score only if out of proportion to paralysis  0=no ataxia (or aphasic, hemiplegic)   1=ataxia in upper or lower extremity   2=ataxia in upper AND lower extremity   X=unable to assess due to amputation, fusion, etc  0   8. Sensory-use safety pin. Check grimace or withdrawal if stuporous. Score only stroke- related losses  0=normal   1=mild-mod unilateral loss but patient aware of touch 9or aphasic, confused)   2=total loss, pt unaware of touch. Coma, bilateral loss  0   9. Best Language-describe cookie jar picture, name objects, read sentences. May use repeating, writing, stereognosis  0=normal   1=mild-mod aphasia (diff but partly comprehensible)   2=severe aphasia (almost no info exchanged)   3=mute, global aphasia, coma. No 1 step commands  0    10. Dysarthria-read list of words  0=normal or intubated or other physical barrier  1=mild-mod; slurred but intelligible   2=severe; unintelligible or mute  0    11. Extinction/Neglect- simultaneously touch patient on both hands, show fingers in both visual fields, ask about deficit, left hand  0=normal, none detected. (visual loss alone)   1=neglects or extinguishes to double stimulation in any modality   2=profound neglect in more than one modality  0    Score    2     Was the patient 4.5-9 hrs from last known well or a wake-up stroke? No    Was IV tenecteplase given? Started at outside hospital    The patient was not a thrombolytic candidate due to N/A    Advanced imaging was interpreted at completed to OSH  The patient was not a  thrombectomy candidate due to no LVO     Dysphagia screen: Failed screen by nursing staff and awaiting ST evaluation        Health History    Past Medical History:   Diagnosis Date    Acid reflux     ADHD (attention deficit hyperactivity disorder)     Have had since I was a child    Anxiety     panic attacks    Arnold-Chiari malformation (HCC)     Arthritis     Asthma     Cancer (HCC)     cervical    Chest pain     associated with asthma?    Chiari I malformation [facility])     Surgery with Dr. Macario Carls in early 2000s    Chiari malformation     Dr. Danielle Dess did my surgery at [facility]    Chronic back pain 02/11/2009    Deep vein thrombosis (DVT) (HCC) 1996    right leg    Degenerative arthritis of spine     Degenerative disc disease, cervical     Degenerative disc disease, lumbar     Degenerative disc disease, thoracic     Depression     In the past    Diverticulitis of colon (without mention of hemorrhage)(562.11)     4 times    Fibromyalgia     Fibromyalgia     Generalized headaches     migraines    Head injury     Heart murmur     Was noticed during a test    HTN     Joint pain     Arthritis    Menorrhagia     Migraines     Nausea     Other chest pain 07/09/2018    Overweight(278.02)     Pain     back, neck, legs    Pneumonia     In the past    Pulmonary embolism (HCC)     My lags    Rapid palpitations 02/11/2009    Spinal headache     Chiar headache they start my neck and going to my head    Thyroid disorder      Surgical History:   Procedure Laterality Date    HYSTERECTOMY  2005    uterus + 1 ovary removed    HX KNEE ARTHROSCOPY Left 03/2014    and Dec 2015    TOTAL THYROIDECTOMY, POSSIBLE CENTRAL NECK LYMPH NODE DISSECTION, INTRAOPERATIVE RECURRENT LARYNGEAL NERVE MONITORING, INTRAOPERATIVE PARATHYROID HORMONE MONITORING, PARATHYROIDECTOMY N/A 08/06/2014 Performed by Marcelle Overlie, MD at [facility] OR    UPPER GASTROINTESTINAL ENDOSCOPY  11/03/2015    HX CHOLECYSTECTOMY  04/29/2017    HX CESAREAN SECTION      HX HYSTERECTOMY      [facility] did it    HX KNEE ARTHROSCOPY Right 1990's    HX SURGERY      neck area, chiari malformation    HX SURGERY      abdominal sx    HX THYROIDECTOMY      HX TUBAL LIGATION      KNEE SURGERY      Both knee have been scoped    OOPHORECTOMY      remaining ovary removed     Family History   Problem Relation Name Age of Onset    Cancer Father Oscer Brandice Hugie         My dad passed away of leukemi and Lung canceta and lung  cancer and another form of cancer    Heart Failure Father Oscer Shaira Malony     Migraines Father Oscer Anjail Sanden     Alcohol liver disease Father Oscer Teyanna Garciagonzalez         Was a heavy drinker throughout life. 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 and paralyzation    Arthritis Father Charnessa Metzner         Hands lagsand back    Back pain Father Oscer Hyatt Brodrick         Had arthritis in his back he had been paralyzed at one point also was in a motorcycle accident and semi accident    Hypertension Father Heera Wenig     Joint Pain Father Oscer Samentha Elsayed     High Cholesterol Father Oscer Baileyann Dengler     Arthritis-rheumatoid Other patient     Seizures Other Uncle's son         Blackstone mal seizures    Mental Retardation Sister Glenys Begeman     Migraines Sister Zinaya Bodenhamer     Tremor Sister Mindy Fredenberg     Diabetes Sister Baraah Vandelinder     Alcohol liver disease Sister Jaanvi Mizener         Was a have drinker    Stroke Maternal Grandfather Ismelda Crupi     Arthritis Maternal Grandfather Myeisha Sangha         Legs and hands back    Back pain Maternal Grandfather Athira Bohon     Heart problem Maternal Grandfather Eleta Kolbe     Hypertension Maternal Grandfather Tiasha Horvitz     Stroke Paternal Grandmother Loreda Koralewski     Cancer Paternal Grandmother Lars Pinks         My grandmother had bone cancer cervical cancer    Heart Attack Paternal Grandmother Deania Rockwood     Heart problem Paternal Grandmother Jeaneane Barnier     Hypertension Paternal Grandmother Miliana Dozois     Joint Pain Paternal Grandmother Jeileen Rajan     Osteoporosis Paternal Grandmother Leshon Rueger     Arthritis Paternal Grandmother Eufelia Stepper         Had and her legs back a hands    Back pain Paternal Grandmother Lithzy Allcorn     Bleeding Disorders Paternal Grandmother Lauraine Desruisseaux         Blood clots /    High Cholesterol Paternal Grandmother Oretha Abby     Other Other Sister's daughter         hyerectomies in age 48s or 54s in her sisters.     Seizures Other Sister's daughter         Currently found out she's having seizures    Alcohol liver disease Mother Benito Mccreedy         Severe alcoholic    Heart problem Brother Tamala Bari     Hypertension Brother Tamala Bari     Joint Pain Brother Tamala Bari     Osteoporosis Brother Tamala Bari     Osteoporosis Sister Eunice Blase     Unknown to Patient Sister Mava Biber         Cleft palate    Arthritis Sister Tammy         Hand    Cancer Sister Tammy         Cervical cancer and cervical cyst    Hypertension Sister Tammy     Bleeding Disorders Sister Babette Relic  Brain Aneurysm    Hypotension Sister Tammy     Unknown to Patient Sister Babette Relic         Sister had a brain aneurysm    Back pain Daughter Leonette Nutting    Neck Pain Daughter Inetta Fermo         Chair    Neurologic Disorder Daughter Inetta Fermo     Arthritis Maternal Grandmother Marland Mcalpine     Cancer Maternal Grandmother Marland Mcalpine         Breast Cancer    Osteoporosis Maternal Grandmother Marland Mcalpine     Stroke Maternal Grandmother Marjaret Aura Camps     High Cholesterol Maternal Grandmother Marjaret Lachina Brun     Unknown to Patient Son Reuel Boom         Autistic    Unknown to Patient Other Grandparents brother Polio     Social History     Socioeconomic History    Marital status: Divorced   Tobacco Use    Smoking status: Former     Current packs/day: 0.00     Average packs/day: 1.5 packs/day for 15.0 years (22.5 ttl pk-yrs)     Types: Cigarettes     Start date: 07/04/1979     Quit date: 07/04/1994     Years since quitting: 28.3    Smokeless tobacco: Former     Types: Chew   Substance and Sexual Activity    Alcohol use: Yes     Alcohol/week: 1.0 standard drink of alcohol     Types: 1 Glasses of wine per week     Comment: Socially    Drug use: Yes     Types: Marijuana, Methamphetamines, 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    Sexual activity: Not Currently     Partners: Male     Birth control/protection: None            Medications:    PRN Medications:       Physical Exam    HEENT: normocephalic, eyes open with no discharge, nares patent, oropharynx is clear with no lesions, palate intact  CV: regular rate, distal pulses palpable  Chest: normal configuration, equal chest rise bilaterally  Ab: soft, non-tender, no masses, no organomegaly  Skin: no rashes or lesions    Extended Neuro Exam:    Mental status: alert, oriented to person/place/time  Speech:    Normal Abnormal   Fluency  Stuttering speech   Comprehension x    Articulation x    Repetition x    Naming x        Cranial Nerves:    Normal Abnormal   II x    III, IV, VI x    V x    VII                         x                             VIII x    IX, X x    XI x    XII x        Muscle/motor:   Tone: nml  Bulk: nml  Fasciculations: none  Pronator drift: none     NF NE SA EF EE WE WF FF FE FA TA HF HA HE KF KE DF PF  R 5 5 4+ 4+ 4+ 4 4+ 4+ 4+ 4+ 4+ 5 4+   5 5 5    L   5 5 5 5 5 5 5 5 5 5 5   5 5 5        Sensation:    Normal RUE LUE RLE LLE   Light Touch x           Coordination:    Normal Abnormal Right Abnormal Left   Finger to Nose x     Heel to Shin x       Gait and Station:  Regular gait: not assessed       Lab/Radiology/Other Diagnostic Tests:  Pertinent labs reviewed     Pertinent radiology reviewed.      I spent 39 minutes of critical care time managing the care of this patient. Annaleia is critically ill with an acute stroke, her cares included: preparing to see the patient, detailed neurologic and systems exam including NIHSS and reflex exam, obtaining and/or reviewing separately obtained history, medication review, laboratory data review and interpretation, review of available and obtained imaging, referring and communication with other health care professionals (when not separately reported), documenting clinical information in the electronic or other health record and Independently interpreting results (not separately reported) and communicating results to the patient/family/caregiver.     Hettie Holstein, APRN-NP  Vascular Neurology  Available on [facility]

## 2022-11-17 NOTE — Progress Notes
Report received from sending hospital. TNK given at outside hospital at 1259 P.M. LKW at 10AM. Found to have new onset stuttering and RUE weakness.     NIH at OSH  2 R Upper Extrem  2 Best Language  1 Dysarthria  ----------  Score: 5

## 2022-11-17 NOTE — Progress Notes
HPI: Stroke, right arm weakness, aphasia/stutter.     PMH: asthma, depression, disorder of thyroid, HTN, neuromyopathy.     Vital Signs: HR 70, RR 11, BP 133/82, Temp 97.31F, 98% on room air.     Imaging / Procedures: CT/CTA: NO hemodynamically significant stenosis, no LVO.    Labs: WBC 9.41, Hgb 13.8, Hct 41.9, Plt 262, Na 139, K 4, Cl 106, CO2 23, BUN 16, Cr 0.9, Glucose 130.    Meds / Drips: TNK at 13.00.     Reason for Transfer: service is not available, post-TNK administration.

## 2022-11-17 NOTE — Progress Notes
SPEECH-LANGUAGE PATHOLOGY  CLINICAL SWALLOW ASSESSMENT     Name: Tina [name]   MRN: [id number]     DOB: [birth year]      Age: 52 y.o.  Admission Date: 11/17/2022     LOS: 0 days     Date of Service: 11/17/2022    Evaluation Summary   Clinical swallow evaluation completed.   Clinical Impression: Normal oropharyngeal swallow    Moderately aphasic w/ word finding difficulty. Pt states it has gotten much better since earlier today and helps when she slows down her rate of speech. This service will follow up with formal speech-language assessment as appropriate.      Swallow Recommendations  PO: Regular solids, Thin liquids  Medications: As tolerated  Positioning: Upright 90 degrees or chair mode  Swallow Strategies: Small bites/sips, Slow rate of intake    PO Presentation  Presentations: Therapist fed   Thin liquids: tsp, straw, 3oz  Other Consistencies: Puree, Soft and bite-sized, Regular solids    Clinical Interpretation of Oral Stage  Withdraw Bolus: WFL  Form Bolus: WFL  Masticate Bolus: WFL  Transfer Bolus: WFL  Anterior Bolus Spillage: None  Oral Residue: None    Clinical Interpretation of Pharyngeal Stage  Swallow Initiation: Timely  Laryngeal Elevation: Suspected to be adequate  Signs / Symptoms Of Aspiration: No  > Pt completed 3 oz Water Protocol without stopping/re-starting and without overt s/s of aspiration immediately or within one minute following. This indicates decreased risk for silent aspiration.    Oral Mech Exam  Oral Mech: Slightly decreased strength during labial protrusion. Voice is strong. All else WNL. No focal orofacial weakness noted.    Attempted Swallow Strategies  Small Bites/Sips: Effective  Slow Rate of Intake: Effective    Subjective  Significant Hospital events: Tina [name] is a 52 y.o. female with h/o thyroid disorder, migraines, fibromyalgia, depression/anxiety, DDD, chiari malformation presented with RUE weakness and aphasia to [facility] hospital, LKW @ 1000.  NIHSS 5 for RUE weakness, language and dysarthria.  CT head negative for acute process, TNK was administered at 1259.  CTA H/N negative for LVO.  She was transferred to [facility] for post TNK monitoring and admitted to the Neuro ICU.  NIHSS 2 on admission     Pertinent history of dysphagia: None reported by pt. No recent history of PNA.     Psychosocial Status: Willing and Cooperative to Participate  Persons Present: None    Nutrition  Nutrition Prior To Hospitalization: Oral, Regular, Thin Liquids   Current Form Of Nutrition: NPO    Education  Persons Educated: Patient  Barriers To Learning: None Noted  Teaching Methods: Verbal  Topics: Dysphagia  Patient Response: Verbalized Understanding    Assessment/Prognosis  Prognosis: Anticipate patient is at or near baseline  NOMS Dysphagia Rating: 7-Normal -Ability to eat indep not limited by swallow function. Swallow safe/efficient for all consistencies. Compensatory strategies effectively used when needed.     Plan: speech/lang assessment    Therapist:Kymorah Korf Pasty Arch, CCC-SLP Amie Critchley [id number] // Weekend pager [phone]  Date:11/17/2022

## 2022-11-18 ENCOUNTER — Encounter: Admit: 2022-11-18 | Discharge: 2022-11-18 | Payer: Medicaid Other

## 2022-11-18 DIAGNOSIS — R079 Chest pain, unspecified: Secondary | ICD-10-CM

## 2022-11-18 DIAGNOSIS — IMO0002 Chiari malformation: Secondary | ICD-10-CM

## 2022-11-18 DIAGNOSIS — E079 Disorder of thyroid, unspecified: Secondary | ICD-10-CM

## 2022-11-18 DIAGNOSIS — N920 Excessive and frequent menstruation with regular cycle: Secondary | ICD-10-CM

## 2022-11-18 DIAGNOSIS — I2699 Other pulmonary embolism without acute cor pulmonale: Secondary | ICD-10-CM

## 2022-11-18 DIAGNOSIS — M5134 Other intervertebral disc degeneration, thoracic region: Secondary | ICD-10-CM

## 2022-11-18 DIAGNOSIS — J189 Pneumonia, unspecified organism: Secondary | ICD-10-CM

## 2022-11-18 DIAGNOSIS — R011 Cardiac murmur, unspecified: Secondary | ICD-10-CM

## 2022-11-18 DIAGNOSIS — M5136 Other intervertebral disc degeneration, lumbar region: Secondary | ICD-10-CM

## 2022-11-18 DIAGNOSIS — M2550 Pain in unspecified joint: Secondary | ICD-10-CM

## 2022-11-18 DIAGNOSIS — Q0700 Arnold-Chiari syndrome without spina bifida or hydrocephalus: Secondary | ICD-10-CM

## 2022-11-18 DIAGNOSIS — R0789 Other chest pain: Secondary | ICD-10-CM

## 2022-11-18 DIAGNOSIS — S0990XA Unspecified injury of head, initial encounter: Secondary | ICD-10-CM

## 2022-11-18 DIAGNOSIS — E663 Overweight: Secondary | ICD-10-CM

## 2022-11-18 DIAGNOSIS — M797 Fibromyalgia: Secondary | ICD-10-CM

## 2022-11-18 DIAGNOSIS — M5030 Other cervical disc degeneration, unspecified cervical region: Secondary | ICD-10-CM

## 2022-11-18 DIAGNOSIS — I82409 Acute embolism and thrombosis of unspecified deep veins of unspecified lower extremity: Secondary | ICD-10-CM

## 2022-11-18 DIAGNOSIS — F32A Depression: Secondary | ICD-10-CM

## 2022-11-18 DIAGNOSIS — I639 Cerebral infarction, unspecified: Secondary | ICD-10-CM

## 2022-11-18 DIAGNOSIS — G971 Other reaction to spinal and lumbar puncture: Secondary | ICD-10-CM

## 2022-11-18 DIAGNOSIS — G43909 Migraine, unspecified, not intractable, without status migrainosus: Secondary | ICD-10-CM

## 2022-11-18 DIAGNOSIS — J45909 Unspecified asthma, uncomplicated: Secondary | ICD-10-CM

## 2022-11-18 DIAGNOSIS — K219 Gastro-esophageal reflux disease without esophagitis: Secondary | ICD-10-CM

## 2022-11-18 DIAGNOSIS — K5732 Diverticulitis of large intestine without perforation or abscess without bleeding: Secondary | ICD-10-CM

## 2022-11-18 DIAGNOSIS — F419 Anxiety disorder, unspecified: Secondary | ICD-10-CM

## 2022-11-18 DIAGNOSIS — R002 Palpitations: Secondary | ICD-10-CM

## 2022-11-18 DIAGNOSIS — R519 Generalized headaches: Secondary | ICD-10-CM

## 2022-11-18 DIAGNOSIS — M479 Spondylosis, unspecified: Secondary | ICD-10-CM

## 2022-11-18 DIAGNOSIS — R110 Nausea: Secondary | ICD-10-CM

## 2022-11-18 DIAGNOSIS — F909 Attention-deficit hyperactivity disorder, unspecified type: Secondary | ICD-10-CM

## 2022-11-18 DIAGNOSIS — C801 Malignant (primary) neoplasm, unspecified: Secondary | ICD-10-CM

## 2022-11-18 DIAGNOSIS — R52 Pain, unspecified: Secondary | ICD-10-CM

## 2022-11-18 DIAGNOSIS — M1990 Unspecified osteoarthritis, unspecified site: Secondary | ICD-10-CM

## 2022-11-18 DIAGNOSIS — G935 Compression of brain: Secondary | ICD-10-CM

## 2022-11-18 DIAGNOSIS — M549 Dorsalgia, unspecified: Secondary | ICD-10-CM

## 2022-11-18 LAB — BASIC METABOLIC PANEL
ANION GAP: 7 10*3/uL (ref 3–12)
CALCIUM: 9.1 mg/dL (ref 8.5–10.6)
CHLORIDE: 105 MMOL/L (ref 98–110)
CO2: 25 MMOL/L (ref 21–30)
EGFR: >60 mL/min (ref >60)
GLUCOSE,PANEL: 106 mg/dL — ABNORMAL HIGH (ref 70–100)
POTASSIUM: 4.1 MMOL/L (ref 3.5–5.1)

## 2022-11-18 LAB — CBC AND DIFF
ABSOLUTE BASO COUNT: 0.2 10*3/uL — ABNORMAL HIGH (ref 0–0.20)
ABSOLUTE EOS COUNT: 0.1 10*3/uL (ref 0–0.45)
ABSOLUTE LYMPH COUNT: 0.7 10*3/uL — ABNORMAL LOW (ref 1.0–4.8)
ABSOLUTE MONO COUNT: 0.7 10*3/uL (ref 0–0.80)
ABSOLUTE NEUTROPHIL: 12 10*3/uL — ABNORMAL HIGH (ref 1.8–7.0)
BASOPHILS %: 2 % (ref 0–2)
HEMATOCRIT: 41 % (ref 36–45)
MCV: 89 FL (ref 80–100)
RBC COUNT: 4.6 M/UL — ABNORMAL HIGH (ref <100)
WBC COUNT: 14 10*3/uL — ABNORMAL HIGH (ref >40)

## 2022-11-18 LAB — IONIZED CALCIUM: IONIZED CALCIUM: 1.1 MMOL/L — ABNORMAL LOW (ref 1.0–1.3)

## 2022-11-18 LAB — LIPID PROFILE
CHOLESTEROL: 163 mg/dL (ref <200)
TRIGLYCERIDES: 132 mg/dL (ref <150)
VLDL: 26 mg/dL (ref 12.0–15.0)

## 2022-11-18 LAB — HEMOGLOBIN A1C: HEMOGLOBIN A1C: 5.6 % (ref 4.0–5.7)

## 2022-11-18 LAB — PHOSPHORUS: PHOSPHORUS: 3.5 mg/dL — ABNORMAL HIGH (ref 2.0–4.5)

## 2022-11-18 MED ORDER — ATORVASTATIN 40 MG PO TAB
40 mg | ORAL_TABLET | Freq: Every day | ORAL | 3 refills | Status: AC
Start: 2022-11-18 — End: ?
  Filled 2022-11-18: qty 30, 30d supply, fill #1

## 2022-11-18 MED ORDER — BUDESONIDE-FORMOTEROL 80-4.5 MCG/ACTUATION IN HFAA
2 | Freq: Two times a day (BID) | RESPIRATORY_TRACT | 0 refills | Status: DC | PRN
Start: 2022-11-18 — End: 2022-11-19

## 2022-11-18 MED ADMIN — LORAZEPAM 2 MG/ML IJ SOLN GROUP [280020]: 1 mg | INTRAVENOUS | @ 04:00:00 | Stop: 2022-11-18 | NDC 00641604401
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
## 2022-11-18 NOTE — Discharge Instructions - Appointments
Patient has follow-up appointment on Otcotber 3rd with Dr. Assunta Curtis (general neurology).
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
## 2022-11-19 ENCOUNTER — Inpatient Hospital Stay
Admit: 2022-11-17 | Discharge: 2022-11-19 | Disposition: A | Discharge status: Home / Self Care | Payer: Medicaid Other | Source: Other Acute Inpatient Hospital | Attending: Critical Care Medicine | Admitting: Neurology

## 2022-11-19 ENCOUNTER — Encounter: Admit: 2022-11-19 | Discharge: 2022-11-19 | Payer: Medicaid Other

## 2022-11-19 DIAGNOSIS — Z8249 Family history of ischemic heart disease and other diseases of the circulatory system: Secondary | ICD-10-CM

## 2022-11-19 DIAGNOSIS — R4701 Aphasia: Secondary | ICD-10-CM

## 2022-11-19 DIAGNOSIS — F909 Attention-deficit hyperactivity disorder, unspecified type: Secondary | ICD-10-CM

## 2022-11-19 DIAGNOSIS — E890 Postprocedural hypothyroidism: Secondary | ICD-10-CM

## 2022-11-19 DIAGNOSIS — Z79899 Other long term (current) drug therapy: Secondary | ICD-10-CM

## 2022-11-19 DIAGNOSIS — M479 Spondylosis, unspecified: Secondary | ICD-10-CM

## 2022-11-19 DIAGNOSIS — Z823 Family history of stroke: Secondary | ICD-10-CM

## 2022-11-19 DIAGNOSIS — Z888 Allergy status to other drugs, medicaments and biological substances status: Secondary | ICD-10-CM

## 2022-11-19 DIAGNOSIS — F410 Panic disorder [episodic paroxysmal anxiety] without agoraphobia: Secondary | ICD-10-CM

## 2022-11-19 DIAGNOSIS — Z83438 Family history of other disorder of lipoprotein metabolism and other lipidemia: Secondary | ICD-10-CM

## 2022-11-19 DIAGNOSIS — Z9851 Tubal ligation status: Secondary | ICD-10-CM

## 2022-11-19 DIAGNOSIS — F419 Anxiety disorder, unspecified: Secondary | ICD-10-CM

## 2022-11-19 DIAGNOSIS — Z803 Family history of malignant neoplasm of breast: Secondary | ICD-10-CM

## 2022-11-19 DIAGNOSIS — F4312 Post-traumatic stress disorder, chronic: Secondary | ICD-10-CM

## 2022-11-19 DIAGNOSIS — Z86711 Personal history of pulmonary embolism: Secondary | ICD-10-CM

## 2022-11-19 DIAGNOSIS — Z820 Family history of epilepsy and other diseases of the nervous system: Secondary | ICD-10-CM

## 2022-11-19 DIAGNOSIS — Z86718 Personal history of other venous thrombosis and embolism: Secondary | ICD-10-CM

## 2022-11-19 DIAGNOSIS — G43119 Migraine with aura, intractable, without status migrainosus: Secondary | ICD-10-CM

## 2022-11-19 DIAGNOSIS — E1142 Type 2 diabetes mellitus with diabetic polyneuropathy: Secondary | ICD-10-CM

## 2022-11-19 DIAGNOSIS — E785 Hyperlipidemia, unspecified: Secondary | ICD-10-CM

## 2022-11-19 DIAGNOSIS — Z801 Family history of malignant neoplasm of trachea, bronchus and lung: Secondary | ICD-10-CM

## 2022-11-19 DIAGNOSIS — Z98891 History of uterine scar from previous surgery: Secondary | ICD-10-CM

## 2022-11-19 DIAGNOSIS — Q0700 Arnold-Chiari syndrome without spina bifida or hydrocephalus: Secondary | ICD-10-CM

## 2022-11-19 DIAGNOSIS — Z87820 Personal history of traumatic brain injury: Secondary | ICD-10-CM

## 2022-11-19 DIAGNOSIS — Z79890 Hormone replacement therapy (postmenopausal): Secondary | ICD-10-CM

## 2022-11-19 DIAGNOSIS — Z8261 Family history of arthritis: Secondary | ICD-10-CM

## 2022-11-19 DIAGNOSIS — F32A Depression, unspecified: Secondary | ICD-10-CM

## 2022-11-19 DIAGNOSIS — I10 Essential (primary) hypertension: Secondary | ICD-10-CM

## 2022-11-19 DIAGNOSIS — Z886 Allergy status to analgesic agent status: Secondary | ICD-10-CM

## 2022-11-19 DIAGNOSIS — F444 Conversion disorder with motor symptom or deficit: Secondary | ICD-10-CM

## 2022-11-19 DIAGNOSIS — Z880 Allergy status to penicillin: Secondary | ICD-10-CM

## 2022-11-19 DIAGNOSIS — Z833 Family history of diabetes mellitus: Secondary | ICD-10-CM

## 2022-11-19 DIAGNOSIS — Z810 Family history of intellectual disabilities: Secondary | ICD-10-CM

## 2022-11-19 DIAGNOSIS — Z9049 Acquired absence of other specified parts of digestive tract: Secondary | ICD-10-CM

## 2022-11-19 DIAGNOSIS — M797 Fibromyalgia: Secondary | ICD-10-CM

## 2022-11-19 DIAGNOSIS — Z62810 Personal history of physical and sexual abuse in childhood: Secondary | ICD-10-CM

## 2022-11-19 DIAGNOSIS — Z8049 Family history of malignant neoplasm of other genital organs: Secondary | ICD-10-CM

## 2022-11-19 DIAGNOSIS — R29702 NIHSS score 2: Secondary | ICD-10-CM

## 2022-11-19 DIAGNOSIS — Z9282 Status post administration of tPA (rtPA) in a different facility within the last 24 hours prior to admission to current facility: Secondary | ICD-10-CM

## 2022-11-19 DIAGNOSIS — G8929 Other chronic pain: Secondary | ICD-10-CM

## 2022-11-19 DIAGNOSIS — M1990 Unspecified osteoarthritis, unspecified site: Secondary | ICD-10-CM

## 2022-11-19 DIAGNOSIS — Z90710 Acquired absence of both cervix and uterus: Secondary | ICD-10-CM

## 2022-11-19 DIAGNOSIS — Z8262 Family history of osteoporosis: Secondary | ICD-10-CM

## 2022-11-19 DIAGNOSIS — Z882 Allergy status to sulfonamides status: Secondary | ICD-10-CM

## 2022-11-19 NOTE — Progress Notes
1845: AVS reviewed with pt, all questions answered at this time. Neuro exam intact, pt reports no pain. PIV removed.     1900: Pt wheeled down to lobby via wheelchair by Adela Lank, RN; All belongings sent with patient.     DISCHARGED

## 2022-11-19 NOTE — Care Plan
Problem: Discharge Planning  Goal: Participation in plan of care  Outcome: Goal Achieved  Goal: Knowledge regarding plan of care  Outcome: Goal Achieved  Goal: Prepared for discharge  Outcome: Goal Achieved     Problem: Neurological Status, Impaired/Altered  Goal: Progress toward maximizing functional outcomes  Outcome: Goal Achieved  Goal: Cognitive status restored to baseline  Outcome: Goal Achieved     Problem: High Fall Risk  Goal: High Fall Risk  Outcome: Goal Achieved     Problem: Skin Integrity  Goal: Skin integrity intact  Outcome: Goal Achieved  Goal: Healing of skin (Wound & Incision)  Outcome: Goal Achieved  Goal: Healing of skin (Pressure Injury)  Outcome: Goal Achieved

## 2022-11-30 ENCOUNTER — Encounter: Admit: 2022-11-30 | Discharge: 2022-11-30 | Payer: Medicaid Other

## 2022-12-06 ENCOUNTER — Ambulatory Visit: Admit: 2022-12-06 | Discharge: 2022-12-06 | Payer: Medicaid Other

## 2022-12-06 ENCOUNTER — Encounter: Admit: 2022-12-06 | Discharge: 2022-12-06 | Payer: Medicaid Other

## 2022-12-06 DIAGNOSIS — R55 Syncope and collapse: Secondary | ICD-10-CM

## 2022-12-06 MED ORDER — IOHEXOL 350 MG IODINE/ML IV SOLN
70 mL | Freq: Once | INTRAVENOUS | 0 refills | Status: CP
Start: 2022-12-06 — End: ?
  Administered 2022-12-06: 21:00:00 70 mL via INTRAVENOUS

## 2022-12-06 MED ORDER — SODIUM CHLORIDE 0.9 % IJ SOLN
50 mL | Freq: Once | INTRAVENOUS | 0 refills | Status: CP
Start: 2022-12-06 — End: ?
  Administered 2022-12-06: 21:00:00 50 mL via INTRAVENOUS

## 2022-12-12 ENCOUNTER — Ambulatory Visit: Admit: 2022-12-12 | Discharge: 2022-12-12 | Payer: Medicaid Other

## 2022-12-12 ENCOUNTER — Encounter: Admit: 2022-12-12 | Discharge: 2022-12-12 | Payer: Medicaid Other

## 2022-12-12 DIAGNOSIS — R110 Nausea: Secondary | ICD-10-CM

## 2022-12-12 DIAGNOSIS — S0990XA Unspecified injury of head, initial encounter: Secondary | ICD-10-CM

## 2022-12-12 DIAGNOSIS — M549 Dorsalgia, unspecified: Secondary | ICD-10-CM

## 2022-12-12 DIAGNOSIS — R011 Cardiac murmur, unspecified: Secondary | ICD-10-CM

## 2022-12-12 DIAGNOSIS — R002 Palpitations: Secondary | ICD-10-CM

## 2022-12-12 DIAGNOSIS — N920 Excessive and frequent menstruation with regular cycle: Secondary | ICD-10-CM

## 2022-12-12 DIAGNOSIS — J45909 Unspecified asthma, uncomplicated: Secondary | ICD-10-CM

## 2022-12-12 DIAGNOSIS — R519 Generalized headaches: Secondary | ICD-10-CM

## 2022-12-12 DIAGNOSIS — E079 Disorder of thyroid, unspecified: Secondary | ICD-10-CM

## 2022-12-12 DIAGNOSIS — K219 Gastro-esophageal reflux disease without esophagitis: Secondary | ICD-10-CM

## 2022-12-12 DIAGNOSIS — M2550 Pain in unspecified joint: Secondary | ICD-10-CM

## 2022-12-12 DIAGNOSIS — J189 Pneumonia, unspecified organism: Secondary | ICD-10-CM

## 2022-12-12 DIAGNOSIS — G935 Compression of brain: Secondary | ICD-10-CM

## 2022-12-12 DIAGNOSIS — M5030 Other cervical disc degeneration, unspecified cervical region: Secondary | ICD-10-CM

## 2022-12-12 DIAGNOSIS — Q0700 Arnold-Chiari syndrome without spina bifida or hydrocephalus: Secondary | ICD-10-CM

## 2022-12-12 DIAGNOSIS — E663 Overweight: Secondary | ICD-10-CM

## 2022-12-12 DIAGNOSIS — M797 Fibromyalgia: Secondary | ICD-10-CM

## 2022-12-12 DIAGNOSIS — G43909 Migraine, unspecified, not intractable, without status migrainosus: Secondary | ICD-10-CM

## 2022-12-12 DIAGNOSIS — C801 Malignant (primary) neoplasm, unspecified: Secondary | ICD-10-CM

## 2022-12-12 DIAGNOSIS — R52 Pain, unspecified: Secondary | ICD-10-CM

## 2022-12-12 DIAGNOSIS — M1990 Unspecified osteoarthritis, unspecified site: Secondary | ICD-10-CM

## 2022-12-12 DIAGNOSIS — R55 Syncope and collapse: Secondary | ICD-10-CM

## 2022-12-12 DIAGNOSIS — M5134 Other intervertebral disc degeneration, thoracic region: Secondary | ICD-10-CM

## 2022-12-12 DIAGNOSIS — G971 Other reaction to spinal and lumbar puncture: Secondary | ICD-10-CM

## 2022-12-12 DIAGNOSIS — F419 Anxiety disorder, unspecified: Secondary | ICD-10-CM

## 2022-12-12 DIAGNOSIS — F909 Attention-deficit hyperactivity disorder, unspecified type: Secondary | ICD-10-CM

## 2022-12-12 DIAGNOSIS — I2699 Other pulmonary embolism without acute cor pulmonale: Secondary | ICD-10-CM

## 2022-12-12 DIAGNOSIS — K5732 Diverticulitis of large intestine without perforation or abscess without bleeding: Secondary | ICD-10-CM

## 2022-12-12 DIAGNOSIS — M479 Spondylosis, unspecified: Secondary | ICD-10-CM

## 2022-12-12 DIAGNOSIS — F32A Depression: Secondary | ICD-10-CM

## 2022-12-12 DIAGNOSIS — IMO0002 Chiari malformation: Secondary | ICD-10-CM

## 2022-12-12 DIAGNOSIS — M51369 Degenerative disc disease, lumbar: Secondary | ICD-10-CM

## 2022-12-12 DIAGNOSIS — I639 Cerebral infarction, unspecified: Secondary | ICD-10-CM

## 2022-12-12 DIAGNOSIS — R0789 Other chest pain: Secondary | ICD-10-CM

## 2022-12-12 DIAGNOSIS — I82409 Acute embolism and thrombosis of unspecified deep veins of unspecified lower extremity: Secondary | ICD-10-CM

## 2022-12-12 DIAGNOSIS — R079 Chest pain, unspecified: Secondary | ICD-10-CM

## 2022-12-12 NOTE — Telephone Encounter
Scheduled ans with tilt table. Gave instructions and answered any questions.

## 2022-12-19 NOTE — Patient Instructions
--  Preferred method of communication is through [employer], if the issue cannot wait until your next scheduled follow up.   -- MyChart may be used for non-emergent communication. MyChart messages are not reviewed after hours or over the weekend/holidays/after 4PM. Staff will reply to your email within 24-48 business hours.       -- If you do not hear from [place] within one week of a lab or imaging study being completed, please call/send MyChart message to the office to be sure that we have received the results. This is especially challenging when tests are done outside of the [facility] system, as many times results do not make it back to our office for a variety of reasons. In our office no news is good news does not apply. You should hear from [place] with results for each test.  [facility] lab/imaging results:  Due to the CARES act, results automatically release to MyChart.  Dr. Assunta Curtis will continue to send you a result note on any labs that he orders.  With these changes, you may see your results before Dr. Assunta Curtis does. Please allow up to 72 hours for review and response to your results.     -- If you are having acute (new/sudden onset) or severe/worsening neurologic symptoms, please call 911 or seek care in ED.    -- For scheduling of IMAGING/RADIOLOGY, please call [phone] at your convenience to schedule your studies.  -- For referrals placed during the visit, if you have not heard from scheduling within one week, please call the call center at [phone] to get scheduling assistance.  -- For refills on medications, please first contact your pharmacy, who will fax a refill authorization request form to our office.  Weekdays only. Allow up to 2 business days for refills. Please plan ahead, as refills will not be filled after hours.  -- Our scheduling staff, Mardella Layman, may be contacted [phone] for scheduling needs.   -- Gerri Lins, RN, may be contacted at [phone] for urgent needs. Staff will return your call within 24 business hours.     For Appointments:   -- Please try to arrive early for your appointment time to help facilitate your visit. 15 minutes early is recommended.   -- If you are late to your appointment, we reserve the right to ask you to reschedule or wait until next available time to be seen in fairness to other patients scheduled that day.   -- There are times when we are running behind in clinic. Our goal is to always be on time, however, there are time when unexpected events occur with patients, which may cause a delay. We appreciate your understanding when this occurs.

## 2022-12-21 ENCOUNTER — Ambulatory Visit: Admit: 2022-12-21 | Discharge: 2022-12-21 | Payer: Medicaid Other

## 2022-12-21 ENCOUNTER — Encounter: Admit: 2022-12-21 | Discharge: 2022-12-21 | Payer: Medicaid Other

## 2022-12-21 DIAGNOSIS — G629 Polyneuropathy, unspecified: Secondary | ICD-10-CM

## 2022-12-21 NOTE — Progress Notes
EMG/NCS Procedure Time Out Check List:  Prior to the start of the procedure, I personally confirmed the following:      Patient Identity (name & date of birth): Yes  Procedure: Yes  Site: Yes  Body Part: Yes    Time out was performed. On the verbal pain analog scale of 0 to 10, patient's pain level was 0 before the procedure and 0 afterwards. Tolerated NCS well without complication. Declined NEE due to prior intolerance of needles.     The risks of the procedure, including pain, were discussed with the patient.

## 2023-01-01 ENCOUNTER — Encounter: Admit: 2023-01-01 | Discharge: 2023-01-01 | Payer: Medicaid Other | Primary: General Practice

## 2023-01-01 ENCOUNTER — Encounter: Admit: 2023-01-01 | Discharge: 2023-01-01 | Payer: MEDICAID | Primary: General Practice

## 2023-01-01 DIAGNOSIS — R55 Syncope and collapse: Secondary | ICD-10-CM

## 2023-01-01 NOTE — Telephone Encounter
Called and spoke to pt. She said she talked to Dr. Eliane Decree nurse. He does not want her to do it in separate orders. He wants her to complete it at one time.

## 2023-01-01 NOTE — Telephone Encounter
Called and spoke to Tina [name]. She would like to do her tilt table test and then reschedule ANS testing due to equipment being out of service.

## 2023-01-02 ENCOUNTER — Encounter: Admit: 2023-01-02 | Discharge: 2023-01-02 | Payer: MEDICAID | Primary: General Practice

## 2023-01-08 NOTE — Progress Notes
Outpatient Pulmonary Medicine Note  Date of Service: 01/09/2023  Her PCP is C[place]ano, [place] L.     Initial Pulmonary Consult: Asthma evaluation (10/08/2018)   Reason for today's visit: Six month asthma follow up    History of Present Illness   Initial consultation information:  Tina [name] is a 52 y.o. female with hypertension, hypothyroidism, and neurocardiogenic syncope has been following up in our clinic for the management of asthma. She was last seen in March 2022 at which time her asthma and allergies were well controlled and was only needing Symbicort 80-4.5 2 puffs BID as-needed. However, for the past five months, she has been developing worsening exertional and non-exertional dyspnea that has not been responsive to ICS/LABA therapy despite escalation in doses (medium-dose Pulmicort and high-dose Advair).  She does have GERD and notices ongoing reflux symptoms. She has some night-time awakening a few times a week.  She has no pets, no mold or water leaks.  No changes in symptoms with weather or extreme temperature changes. She does snore and does wakeup a few times to catch her breath. She is a former smoker.      07/2022:  Significant improvement in respiratory symptoms. Only uses pulmicort as-needed (~2x/week)  No ongoing triggers. States that due to abdominal issues, all her medical weight loss Rx are on hold.    11/2022:  Admitted to Neuro-ICU for concern for stroke - got TNK.  MRI without evidence of stroke. 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  Dc'd phentermine and Topamax  Continues wegovy 2.4 mg weekly    01/2023:  Uses her AirSupra roughly twice a day. No wheezing or worsening dyspnea.      Review of Systems   Neurological:  Positive for speech change and focal weakness.   A 12-point ROS was completed and negative except for what was mentioned in HPI    Vital Signs and Physical Exam     Vitals:    01/09/23 1458   BP: 94/77   BP Source: Arm, Left Upper   Pulse: 93   Resp: 16   SpO2: 98%   PainSc: Two Weight: 91.6 kg (202 lb)   Height: 170.2 cm (5' 7)     Physical Exam  Vitals reviewed.   Constitutional:       Appearance: Normal appearance.   HENT:      Head: Normocephalic and atraumatic.      Mouth/Throat:      Mouth: Mucous membranes are moist.   Eyes:      General: No scleral icterus.     Conjunctiva/sclera: Conjunctivae normal.   Cardiovascular:      Rate and Rhythm: Normal rate and regular rhythm.   Pulmonary:      Effort: Pulmonary effort is normal. No respiratory distress.   Abdominal:      General: There is no distension.   Musculoskeletal:      Cervical back: Neck supple.      Right lower leg: No edema.      Left lower leg: No edema.   Skin:     General: Skin is warm and dry.   Neurological:      Mental Status: She is alert.      Motor: Weakness present.        Labs, PFT, Current Medications, and Imaging     Labs:   Lab Results   Component Value Date/Time    HGB 13.7 11/18/2022 02:06 AM    WBC 14.7 (H) 11/18/2022 02:06 AM  CR 0.65 11/18/2022 02:06 AM    AEC 0.17 11/18/2022 02:06 AM    AEC 0.16 11/17/2022 03:03 PM    AEC 0.21 02/04/2019 03:27 PM    AEC 0.20 10/23/2016 02:13 PM    AEC 0.50 (H) 08/07/2016 01:52 PM    IGE 184 (H) 02/04/2019 03:27 PM    HGBA1C 5.6 11/17/2022 03:03 PM        Pulmonary Function Test:   Complete PFT Absolute FVC-Pre FEV1-Pre DLCOunc-%Pred-Pre   Latest Ref Rng & Units L L %   02/04/2019  11:44 AM 4.14  3.66  118    06/04/2019   3:01 PM 4.15  3.52        Complete PFT Percent Predicted FVC-%Pred-pre FEV1-%Pred-Pre DLCOunc-%Pred-Pre   Latest Ref Rng & Units % % %   02/04/2019  11:44 AM 108  120  118    06/04/2019   3:01 PM 108  115        Spirometry 01/09/2023:  FVC 4.29L, 121%  FEV1 3.68L, 130%  Ratio 86%    PFT: 08/31/2016 - completed at [facility]  FVC      4.15 (110%) -> 4.11 (109%)  FEV1    3.71 (123%) -> 3.55 (118%)  Ratio    89% -> 86%  RV        1.66 (90%)  TLC       5.8 (109%)  DLCO    95%     Methacholine challenge 06/04/2019:  FEV1 decreased by 21% after a total dose of 4 mg/mL (mild airway hyper-responsiveness)    Current Medication List:   AIRSUPRA Hfaa  ascorbic acid (vitamin C)  atorvastatin  EPINEPHrine  furosemide  ibuprofen  levothyroxine  ondansetron  potassium chloride  prazosin  SLOW-MAG Tbec  TYLENOL PO  WEGOVY Pnij     Radiology and other diagnostic tests:   I personally reviewed all images pertaining to this consult.     OSH CXR 10/05/2018 - completed at [facility]  No acute cardiopulmonary pathology     TTE 06/05/2022:  LV and RV size and function are normal. LVEF 58%  G1DD. PASP unable to be assessed due to poor TR signal.   Assessment and Plan      Mild-intermittent asthma, T2 phenotype  Shortness of breath  Functional neurologic disorder  Chronic migraine    Tina [name] is a 52 year old female with history of T2 asthma that was well controlled with as-needed Symbicort; however, from 12/2021-04/2022, her symptoms worsened and persisted despite her PCP starting medium-dose Pulmicort and high-dose Advair. She self-tapered her regimen to Pulmicort (medium-dose) as needed. Since she weaned herself to as-needed Pulmicrot, we discussed switching to Airsupra 2 puffs q4h PRN during her visit in 07/2022. She is doing well on Airsupra and uses it about twice a day. Spirometry normal. Continue current regimen.     Orders Placed This Encounter    Spiro in Clinic by [facility] Function tech     RTC as needed. Discussed & evaluated with Dr. Ailene Ravel.    Carman Ching, MBBS (Fellow)  Pulmonary & Critical Care     Voice recognition software was used for this dictation. Every attempt was made to edit but small errors may persist. Please call me directly if any concerns arise.

## 2023-01-09 ENCOUNTER — Encounter: Admit: 2023-01-09 | Discharge: 2023-01-09 | Payer: MEDICAID | Primary: General Practice

## 2023-01-09 ENCOUNTER — Ambulatory Visit: Admit: 2023-01-09 | Discharge: 2023-01-09 | Payer: MEDICAID | Primary: General Practice

## 2023-01-09 ENCOUNTER — Ambulatory Visit: Admit: 2023-01-09 | Discharge: 2023-01-10 | Payer: Medicaid Other | Primary: General Practice

## 2023-01-09 DIAGNOSIS — J4520 Mild intermittent asthma, uncomplicated: Secondary | ICD-10-CM

## 2023-01-09 DIAGNOSIS — R0602 Shortness of breath: Secondary | ICD-10-CM

## 2023-01-16 ENCOUNTER — Encounter: Admit: 2023-01-16 | Discharge: 2023-01-16 | Payer: MEDICAID | Primary: General Practice

## 2023-01-17 ENCOUNTER — Encounter: Admit: 2023-01-17 | Discharge: 2023-01-17 | Payer: MEDICAID | Primary: General Practice

## 2023-01-17 ENCOUNTER — Ambulatory Visit: Admit: 2023-01-17 | Discharge: 2023-01-17 | Payer: Medicaid Other | Primary: General Practice

## 2023-01-17 ENCOUNTER — Ambulatory Visit: Admit: 2023-01-17 | Discharge: 2023-01-18 | Payer: Medicaid Other | Primary: General Practice

## 2023-01-17 DIAGNOSIS — J3800 Paralysis of vocal cords and larynx, unspecified: Secondary | ICD-10-CM

## 2023-01-17 DIAGNOSIS — J387 Other diseases of larynx: Secondary | ICD-10-CM

## 2023-01-17 DIAGNOSIS — R490 Dysphonia: Secondary | ICD-10-CM

## 2023-01-23 ENCOUNTER — Encounter: Admit: 2023-01-23 | Discharge: 2023-01-23 | Payer: Medicaid Other | Primary: General Practice

## 2023-01-23 NOTE — Telephone Encounter
Received call from pt, scheduled ANS and tilt table, re-sent instructions.

## 2023-01-30 ENCOUNTER — Encounter: Admit: 2023-01-30 | Discharge: 2023-01-30 | Payer: Medicaid Other | Primary: General Practice

## 2023-02-04 ENCOUNTER — Ambulatory Visit: Admit: 2023-02-04 | Discharge: 2023-02-05 | Payer: Medicaid Other | Primary: General Practice

## 2023-02-04 ENCOUNTER — Encounter: Admit: 2023-02-04 | Discharge: 2023-02-04 | Payer: MEDICAID | Primary: General Practice

## 2023-02-04 DIAGNOSIS — H903 Sensorineural hearing loss, bilateral: Secondary | ICD-10-CM

## 2023-02-04 NOTE — Progress Notes
[name] was seen today with a hearing aid problem. She reported intermittent sound in the left aid. It was decided we would send the aid for repair. [name] opted to send both aids to be inspected. They will be shipped to [facility] after repair.    An appointment for pickup was scheduled with [name], AuD at [facility] as that is her fitting location due to insurance and her original audiologist.    Manufacturer: Resound  Model: Key [id number]  Serial Numbers: Right: [id number]  Left: [id number]  Receiver Length: Right: 0  Left: 0  Receiver Strength: MP  Repair Warranty: 01/15/2024  L&D Warranty: 01/15/2024  Battery: Rechargeable [id number]  Domes: Tulip  Programs: 1. Automatic 2. Hear in noise  Volume: Buttons (Right- raise Left-lower)   App control  Fit Date: 02/03/2021  Purchase Type: [employer]

## 2023-02-07 ENCOUNTER — Encounter: Admit: 2023-02-07 | Discharge: 2023-02-07 | Payer: MEDICAID | Primary: General Practice

## 2023-02-11 ENCOUNTER — Encounter: Admit: 2023-02-11 | Discharge: 2023-02-11 | Payer: MEDICAID | Primary: General Practice

## 2023-02-15 ENCOUNTER — Encounter: Admit: 2023-02-15 | Discharge: 2023-02-15 | Payer: MEDICAID | Primary: General Practice

## 2023-02-16 ENCOUNTER — Encounter: Admit: 2023-02-16 | Discharge: 2023-02-16 | Payer: MEDICAID | Primary: General Practice

## 2023-02-18 ENCOUNTER — Encounter: Admit: 2023-02-18 | Discharge: 2023-02-18 | Payer: MEDICAID | Primary: General Practice

## 2023-02-18 ENCOUNTER — Ambulatory Visit: Admit: 2023-02-18 | Discharge: 2023-02-19 | Payer: Medicaid Other | Primary: General Practice

## 2023-02-18 NOTE — Progress Notes
Patient picked up hearing aids. All components were replaced in the devices and previous settings were restored. She was advised to go home and charge them for the full 3 hours. She was also given information on how to re-pair them to her phone.     Manufacturer: Resound  Model: Key 4 [id number]  Serial Numbers: Right: [id number]  Left: [id number]  Receiver Length: Right: 0  Left: 0  Receiver Strength: MP  Repair Warranty: 01/15/2024  L&D Warranty: 01/15/2024  Battery: Rechargeable [id number]  Domes: [name]  Programs: 1. Automatic 2. Hear in noise  Volume: Buttons (Right- raise Left-lower)   App control  Fit Date: 02/03/2021  Purchase Type: [employer]

## 2023-03-13 ENCOUNTER — Encounter: Admit: 2023-03-13 | Discharge: 2023-03-13 | Payer: MEDICAID | Primary: General Practice

## 2023-03-19 ENCOUNTER — Encounter: Admit: 2023-03-19 | Discharge: 2023-03-19 | Payer: MEDICAID | Primary: General Practice

## 2023-03-20 ENCOUNTER — Encounter: Admit: 2023-03-20 | Discharge: 2023-03-20 | Payer: MEDICAID | Primary: General Practice

## 2023-03-20 NOTE — Telephone Encounter
03/20/2023 Records request faxed per workqueue task below.  sdc    Please request additional records from PCP, Rosalia Hammers, APRN - Ph: [phone]; Fax: [phone]. No external cardiac care.

## 2023-03-22 ENCOUNTER — Encounter: Admit: 2023-03-22 | Discharge: 2023-03-22 | Payer: MEDICAID | Primary: General Practice

## 2023-03-25 ENCOUNTER — Encounter: Admit: 2023-03-25 | Discharge: 2023-03-25 | Payer: MEDICAID | Primary: General Practice

## 2023-03-26 ENCOUNTER — Encounter: Admit: 2023-03-26 | Discharge: 2023-03-26 | Payer: MEDICAID | Primary: General Practice

## 2023-03-26 ENCOUNTER — Ambulatory Visit: Admit: 2023-03-26 | Discharge: 2023-03-27 | Payer: MEDICAID | Primary: General Practice

## 2023-03-28 ENCOUNTER — Encounter: Admit: 2023-03-28 | Discharge: 2023-03-28 | Payer: MEDICAID | Primary: General Practice

## 2023-03-29 ENCOUNTER — Encounter: Admit: 2023-03-29 | Discharge: 2023-03-29 | Payer: MEDICAID | Primary: General Practice

## 2023-03-29 ENCOUNTER — Ambulatory Visit: Admit: 2023-03-29 | Discharge: 2023-03-30 | Payer: MEDICAID | Primary: General Practice

## 2023-03-29 DIAGNOSIS — E6609 Other obesity due to excess calories: Secondary | ICD-10-CM

## 2023-03-29 DIAGNOSIS — I890 Lymphedema, not elsewhere classified: Secondary | ICD-10-CM

## 2023-03-29 DIAGNOSIS — I10 Essential (primary) hypertension: Secondary | ICD-10-CM

## 2023-03-29 DIAGNOSIS — R55 Syncope and collapse: Secondary | ICD-10-CM

## 2023-03-29 DIAGNOSIS — Z136 Encounter for screening for cardiovascular disorders: Secondary | ICD-10-CM
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
## 2023-03-29 NOTE — Assessment & Plan Note
She's been on phenteramine and Wegovey.  Currently on Zepbound.  She's lost a total of 80 pounds over the past 2 years.

## 2023-04-04 ENCOUNTER — Encounter: Admit: 2023-04-04 | Discharge: 2023-04-04 | Payer: MEDICAID | Primary: General Practice

## 2023-04-04 ENCOUNTER — Ambulatory Visit: Admit: 2023-04-04 | Discharge: 2023-04-05 | Payer: MEDICAID | Primary: General Practice

## 2023-04-04 DIAGNOSIS — M7918 Myalgia, other site: Secondary | ICD-10-CM

## 2023-04-04 DIAGNOSIS — F447 Conversion disorder with mixed symptom presentation: Secondary | ICD-10-CM

## 2023-04-04 DIAGNOSIS — R569 Unspecified convulsions: Secondary | ICD-10-CM

## 2023-04-04 MED ORDER — PREGABALIN 75 MG PO CAP
75 mg | ORAL_CAPSULE | Freq: Two times a day (BID) | ORAL | 5 refills | Status: AC
Start: 2023-04-04 — End: ?

## 2023-04-04 NOTE — Patient Instructions
Impression      # Functional Neurological Disorder with mixed symptoms      # Seizure like activity/Syncope - scheduled for autonomic testing         Diagnosis discussed in detail with patient.      Functional Neurological disorder (FND) is a neurological condition in which a person experiences a range of physical symptoms that appear to be caused by a neurological disorder, but are not due to an underlying neurological or medical condition. Instead, FND is thought to be related to physical pain such as Diffuse Myofacial Pain Syndrome, psychological or emotional factors, such as distress/frustration from the symptoms itself, stress or trauma and hyperactivity within amygdala. The symptoms of FND can be distressing and disabling, but with appropriate management and treatment, most people can recover.     Symptoms of FND can include a variety of abnormal movements or sensations that can affect any part of the body, including the limbs, face, and trunk. These symptoms can include:     Tremors or shaking  Jerky movements  Difficulty with coordination or balance  Weakness or paralysis  Non-epileptic seizures or convulsions   Gait difficulties or unsteadiness  Speech difficulties or stuttering  Vision problems  Sensory disturbances, such as numbness or tingling.  Cognitive complaints like Brain Fog - difficulty with making new memory, difficulty with finding the right word, difficulty with attention, Difficulty with multitasking   Muscle Spasm         Treatment approaches are directed towards - 1) decreasing physical pain, 2) decreasing emotional distress and 3) decreasing size of amygdala and activity within the amygdala       How to decrease physical pain like resulting from Diffuse Myofacial Pain Syndrome - through medications like Effexor, Pregablin and others, through procedures like botox, Trigger point injections, through CBT (cognitive physical therapy), through physical therapy.      How to decrease emotional distress/frustrations from the symptoms - 1) through self therapy (understanding physiology behind Functional Neurological Disorder and conveying this information to onself again and again), 2) through cognitive behavioral therapy 3) mindfulness and relaxation techniques such as  Yoga, Meditation      How to decreasing size of amygdala and activity within the amygdala  - refer below links from Professor Hermine Messick at [facility] - through mindfulness techniques such as Yoga and Meditation      Therapy: Many patients with FNDs or movement disorders have found that therapy can be helpful in managing their symptoms and improving their quality of life. Cognitive-behavioral therapy (CBT) is a type of therapy that helps patients identify and change negative thought patterns and behaviors that contribute to their symptoms. Acceptance and commitment therapy (ACT) is another type of therapy that can help patients cope with difficult emotions and build resilience. Some patients may also benefit from family or couples therapy, which can improve communication and support among family members.     Worrying about symptoms can become a vicious cycle. When you worry or stress about your symptoms, your body may respond by actually increasing or intensifying these symptoms, which in turn can lead to more worry and stress. This can create a loop of continuous symptom increase and worry, which can be hard to break.     The mind-body connection is quite powerful. Our emotions and thoughts can influence our physical health. Here's how it works:     1) Museum/gallery conservator Response: Worrying and stress trigger a physiological reaction known as the fight or  flight response, which is designed to help [place] respond to threats. This response causes changes in the body such as increased heart rate, rapid breathing, and increased muscle tension. In the context of FND, this can exacerbate symptoms or lead to the development of new ones.     2) Brain Impact: Chronic worrying can affect the brain's function, keeping it in a constant state of high emotionality and stress. This state can make physical symptoms more intense and harder to manage.     3) Muscle Tension: The physical state of anxiety often involves increased muscle tension, which can amplify symptoms like pain, difficulty moving, and tremors.        Medication: Medication can be an important tool in managing the symptoms of FNDs or movement disorders. We will do Pregablin and or Effexor.      Starting Pregablin 75 mg BID      Lifestyle modifications: Some patients have found that making lifestyle modifications can be helpful in managing their symptoms. For example, stress reduction techniques like meditation or yoga can be helpful for patients with FNDs or movement disorders that are triggered by stress. Regular exercise, a healthy diet, and adequate sleep can also be helpful for managing symptoms and improving overall health.     Support groups and community: Many patients with FNDs or movement disorders have found support and community through [employer], local support groups, or social media groups. Connecting with others who share similar experiences can be helpful in reducing feelings of isolation and improving mental health.     Education: Learning about their condition and its management can be empowering for patients with FNDs or movement disorders. By understanding the causes and triggers of their symptoms, patients can take a more active role in their treatment and management. Healthcare providers can provide educational materials or recommend reputable online resources to patients and their families     Mindfulness-based interventions can be beneficial in the management and treatment of functional neurological disorder (FND). Mindfulness involves bringing one's attention to the present moment, cultivating non-judgmental awareness, and developing a compassionate and accepting attitude towards one's experiences. Here's how mindfulness can be helpful in FND:     1) Stress reduction: FND symptoms can be influenced by stress and emotional factors. Mindfulness practices, such as meditation and deep breathing exercises, can help reduce stress levels and promote relaxation, potentially leading to a decrease in symptom severity.     2) Body awareness: Mindfulness practices encourage individuals to develop a heightened sense of body awareness. This can help individuals with FND notice subtle sensations and changes in their bodies, allowing them to better understand their symptoms and respond to them in a more adaptive way.     3) Emotional regulation: FND is often associated with emotional distress and difficulties in emotion regulation. Mindfulness practices can enhance emotional awareness and provide tools for effectively managing and regulating emotions, reducing the impact of emotional triggers on symptoms.     4) Coping with uncertainty: FND can be characterized by unpredictable symptoms and uncertainty. Mindfulness can help individuals cultivate an accepting and non-judgmental attitude towards their experiences, allowing them to navigate uncertainty with greater resilience and flexibility.     5) Enhanced self-care: Mindfulness practices promote self-care and self-compassion, encouraging individuals with FND to prioritize their well-being, engage in self-care activities, and approach themselves with kindness and understanding.        I understand that you're experiencing symptoms that are challenging and have been attributed to Functional Neurological  Disorders (FND). It's important for me to emphasize that while these symptoms may be associated with 1) physical pain such as Diffuse Myofacial Pain Syndrome, 2) (and/or) psychological or emotional factors, such as distress/frustration from the symptoms itself, stress or trauma and 3) and hyperactivity within amygdala, they are absolutely real and can significantly impact your daily life. As a neurologist, I want to assure you that your symptoms are valid and deserving of attention and care.     Functional Neurological Disorders involve a complex interaction between the brain and the body, resulting in various physical and sensory symptoms from physiological (not pathological) activation and inhibition of certain brain areas. These symptoms can be very disabling, affecting your ability to perform daily activities and impacting your overall well-being.     It's crucial to recognize that (physical pain such as Diffuse Myofacial Pain Syndrome,  psychological or emotional factors, such as distress/frustration from the symptoms itself, stress or trauma and yperactivity within amygdala) can play a role in triggering or exacerbating these symptoms. However, it doesn't mean that the symptoms are all in your head or not genuine. Stress can have a profound impact on the body, including the nervous system, and can manifest in various physical ways.     As your neurologist, I want you to know that I genuinely understand the distress and challenges you're facing. I believe in your experiences, and I'm committed to working with you to find the best possible solutions to manage and alleviate your symptoms.      Your symptoms are not a reflection of weakness or lack of effort on your part. They are an indication of the complexity of the mind-body connection and the individuality of each person's response to stress.     Please remember that you are not alone in this journey. Alongside your healthcare team, we will provide you with the support and guidance needed to improve your quality of life and help you regain control over your symptoms.              Useful informations:      Hermine Messick, PhD at [facility] - Impact of yoga and meditation on various pain, cognitive and behavioral functions.         Dr. 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 Patient had pain locally and in a referred pattern identified during exam. Patient also has restricted range of motion and limited benefits with stretching exercise tried by patient.         CPT codes 86578 - 3 or more identified triggers points       Trigger-point injection using lidocaine has been shown to be one of the most effective treatment modalities to inactivate trigger points and provide prompt relief of symptoms - as per following articles.       Nathaniel Man A, Kaya A, Ardicoglu O,  Ozgocmen S, Zengin FO, [place]. Comparison of lidocaine injection, botulinum toxin injection, and dry needling to trigger points in myofascial pain syndrome. Rheumatol Int. 2005 Oct;25(8):604-11 and  Fransico Meadow, [place]. Trigger points: diagnosis and management. Am Fam Physician. 2002 Feb 15;65(4):653-60. PMID: [id number])

## 2023-04-04 NOTE — Progress Notes
Subjective:        Tina [name] is a 53 y.o. female with weakness/falls and symcope/seizure like activity.          History of Present Illness    04/04/2023      Weakness in bilateral legs for last three to four months   Several falls   EMG/NCS - normal.   Sensory disturbances.       12/12/2022    Weakness in bilateral legs for last three to four months   Several falls     Other symptoms - Lightheadedness - black out and then she will pass out - she does not remember how long she will pass out.       EMG/NCS in 2021 was consistent with neuropathy, though was ultimately felt to represent technical effect from selling during her visit with Dr. Georgina Quint.     Later had skin biopsy which showed normal IENFD.     Patient was recently seen at neuromuscular specialist - based on the exam she did not have deficits to pin, vibration, or abnormal reflexes. Based on symptoms, exam, and information we have at this time she does not appear to have neuropathy     CTA head:     Patent intracranial arterial vasculature without large vessel occlusion or   significant stenosis.     CTA neck:     Patent cervical carotid and vertebral vasculature.     Autonomic testing with Tilt table test - unremarkable     Review of Systems    Past Medical History:    Acid reflux    ADHD (attention deficit hyperactivity disorder)    Alcoholism (HCC)    Anxiety    Arnold-Chiari malformation (HCC)    Arthritis    Asthma    Cancer (HCC)    Chest pain    Chiari I malformation (HCC)    Chiari malformation    Chronic back pain    COPD (chronic obstructive pulmonary disease) (HCC)    CVA (cerebral vascular accident) (HCC)    Deep vein thrombosis (DVT) (HCC)    Degenerative arthritis of spine    Degenerative disc disease, cervical    Degenerative disc disease, lumbar    Degenerative disc disease, thoracic    Depression    Diverticulitis of colon (without mention of hemorrhage)(562.11)    Dizziness    Fibromyalgia    Fibromyalgia    Generalized anxiety disorder with panic attacks    Generalized headaches    Head injury    Hearing loss    Heart murmur    HTN    Hypothyroid    Joint pain    Memory loss    Menorrhagia    Migraines    Nausea    Other chest pain    Overweight(278.02)    Pain    Pneumonia    Pulmonary embolism (HCC)    Rapid palpitations    Seasonal allergic reaction    Sleep disorder    Spinal headache    Stroke aborted by administration of thrombolytic agent (HCC)    Thyroid disorder    Tobacco abuse        Surgical History:   Procedure Laterality Date    HYSTERECTOMY  2005    uterus + 1 ovary removed    HX KNEE ARTHROSCOPY Left 03/2014    and Dec 2015    TOTAL THYROIDECTOMY, POSSIBLE CENTRAL NECK LYMPH NODE DISSECTION, INTRAOPERATIVE RECURRENT LARYNGEAL NERVE MONITORING, INTRAOPERATIVE PARATHYROID HORMONE MONITORING, PARATHYROIDECTOMY  N/A 08/06/2014    Performed by Marcelle Overlie, MD at [facility] OR    UPPER GASTROINTESTINAL ENDOSCOPY  11/03/2015    HX CHOLECYSTECTOMY  04/29/2017    HX CESAREAN SECTION      HX HYSTERECTOMY      [facility] did it    HX KNEE ARTHROSCOPY Right 1990's    HX SURGERY      neck area, chiari malformation    HX SURGERY      abdominal sx    HX THYROIDECTOMY      HX TUBAL LIGATION      KNEE SURGERY      Both knee have been scoped    OOPHORECTOMY      remaining ovary removed        Objective:          acetaminophen (TYLENOL PO) Take  by mouth.    AIRSUPRA 90-80 mcg/actuation HFA inhaler Inhale two puffs by mouth into the lungs every 12 hours as needed.    atorvastatin (LIPITOR) 40 mg tablet Take one tablet by mouth daily. Indications: excessive fat in the blood    benzonatate (TESSALON PERLES) 100 mg capsule Take one capsule by mouth three times daily as needed for Cough.    EPINEPHrine(+) (EPIPEN) 1 mg/mL injection pen Inject 0.3 mL into the muscle once as needed.    FLUoxetine (PROZAC) 20 mg capsule Take one capsule by mouth daily.    furosemide (LASIX) 20 mg tablet Take one tablet by mouth daily.    ibuprofen (MOTRIN) 800 mg tablet levothyroxine (SYNTHROID) 112 mcg tablet Take one tablet by mouth daily 30 minutes before breakfast.    lidocaine (LIDODERM) 5 % topical patch Apply one patch topically to affected area every 24 hours.    magnesium chloride (SLOW-MAG) 71.5 mg EC tablet Take one tablet by mouth daily.    ondansetron (ZOFRAN ODT) 4 mg rapid dissolve tablet     potassium chloride (KLOR-CON SPRINKLE) 10 mEq capsule Take one capsule by mouth daily.    prazosin (MINIPRESS) 2 mg capsule Take one capsule by mouth at bedtime daily.    ZEPBOUND 5 mg/0.5 mL injection pen Inject 0.5 mL under the skin every 7 days.     Vitals:    04/04/23 1025   BP: 124/76   Pulse: 63   PainSc: Zero   Weight: 95.3 kg (210 lb)   Height: 170.2 cm (5' 7)     Body mass index is 32.89 kg/m?Marland Kitchen     Physical Exam    General: alert, oriented x 3  Speech: normal, no dysarthria  Cranial nerves: II Visual fields full to finger counting; III, IV, VI PERRL, extraocular muscles intact, no nystagmus; V facial sensation intact; VII facial expression symmetric; VIII hearing intact to conversation; IX, X palate rise symmetric; XI sternocleidomastoid strength intact; XII tongue midline, no fasciculations present  Motor: (Right/Left) diffuse 4/5   Sensory: Normal to light touch.   Coordination: normal finger nose finger, heel to shin smooth without ataxia  Reflexes: (Right/Left) Biceps 2/2, Triceps 2/2, Brachioradialis 2/2, Knee Jerks 2/2, Ankle Jerks 2/2, Toes downgoing  Abnormal Movements: no tremors, no rigidity, no bradykinesia  Gait: antalgic gait      Assessment and Plan:              Tina [name] is a 53 y.o. female with weakness/falls and symcope/seizure like activity.       Impression     # Functional Neurological Disorder with mixed symptoms     #  Seizure like activity/Syncope - scheduled for autonomic testing       Diagnosis discussed in detail with patient.     Functional Neurological disorder (FND) is a neurological condition in which a person experiences a range of physical symptoms that appear to be caused by a neurological disorder, but are not due to an underlying neurological or medical condition. Instead, FND is thought to be related to physical pain such as Diffuse Myofacial Pain Syndrome, psychological or emotional factors, such as distress/frustration from the symptoms itself, stress or trauma and hyperactivity within amygdala. The symptoms of FND can be distressing and disabling, but with appropriate management and treatment, most people can recover.     Symptoms of FND can include a variety of abnormal movements or sensations that can affect any part of the body, including the limbs, face, and trunk. These symptoms can include:     Tremors or shaking  Jerky movements  Difficulty with coordination or balance  Weakness or paralysis  Non-epileptic seizures or convulsions   Gait difficulties or unsteadiness  Speech difficulties or stuttering  Vision problems  Sensory disturbances, such as numbness or tingling.  Cognitive complaints like Brain Fog - difficulty with making new memory, difficulty with finding the right word, difficulty with attention, Difficulty with multitasking   Muscle Spasm       Treatment approaches are directed towards - 1) decreasing physical pain, 2) decreasing emotional distress and 3) decreasing size of amygdala and activity within the amygdala      How to decrease physical pain like resulting from Diffuse Myofacial Pain Syndrome - through medications like Effexor, Pregablin and others, through procedures like botox, Trigger point injections, through CBT (cognitive physical therapy), through physical therapy.     How to decrease emotional distress/frustrations from the symptoms - 1) through self therapy (understanding physiology behind Functional Neurological Disorder and conveying this information to onself again and again), 2) through cognitive behavioral therapy 3) mindfulness and relaxation techniques such as  Yoga, Meditation     How to decreasing size of amygdala and activity within the amygdala  - refer below links from Professor Hermine Messick at [place] - through mindfulness techniques such as Yoga and Meditation     Therapy: Many patients with FNDs or movement disorders have found that therapy can be helpful in managing their symptoms and improving their quality of life. Cognitive-behavioral therapy (CBT) is a type of therapy that helps patients identify and change negative thought patterns and behaviors that contribute to their symptoms. Acceptance and commitment therapy (ACT) is another type of therapy that can help patients cope with difficult emotions and build resilience. Some patients may also benefit from family or couples therapy, which can improve communication and support among family members.    Worrying about symptoms can become a vicious cycle. When you worry or stress about your symptoms, your body may respond by actually increasing or intensifying these symptoms, which in turn can lead to more worry and stress. This can create a loop of continuous symptom increase and worry, which can be hard to break.    The mind-body connection is quite powerful. Our emotions and thoughts can influence our physical health. Here's how it works:    1) Museum/gallery conservator Response: Worrying and stress trigger a physiological reaction known as the fight or flight response, which is designed to help [place] respond to threats. This response causes changes in the body such as increased heart rate, rapid breathing, and increased muscle tension. In the  context of FND, this can exacerbate symptoms or lead to the development of new ones.    2) Brain Impact: Chronic worrying can affect the brain's function, keeping it in a constant state of high emotionality and stress. This state can make physical symptoms more intense and harder to manage.    3) Muscle Tension: The physical state of anxiety often involves increased muscle tension, which can amplify symptoms like pain, difficulty moving, and tremors.       Medication: Medication can be an important tool in managing the symptoms of FNDs or movement disorders. We will do Pregablin and or Effexor.     Starting Pregablin 75 mg BID      Lifestyle modifications: Some patients have found that making lifestyle modifications can be helpful in managing their symptoms. For example, stress reduction techniques like meditation or yoga can be helpful for patients with FNDs or movement disorders that are triggered by stress. Regular exercise, a healthy diet, and adequate sleep can also be helpful for managing symptoms and improving overall health.     Support groups and community: Many patients with FNDs or movement disorders have found support and community through [employer], local support groups, or social media groups. Connecting with others who share similar experiences can be helpful in reducing feelings of isolation and improving mental health.     Education: Learning about their condition and its management can be empowering for patients with FNDs or movement disorders. By understanding the causes and triggers of their symptoms, patients can take a more active role in their treatment and management. Healthcare providers can provide educational materials or recommend reputable online resources to patients and their families    Mindfulness-based interventions can be beneficial in the management and treatment of functional neurological disorder (FND). Mindfulness involves bringing one's attention to the present moment, cultivating non-judgmental awareness, and developing a compassionate and accepting attitude towards one's experiences. Here's how mindfulness can be helpful in FND:    1) Stress reduction: FND symptoms can be influenced by stress and emotional factors. Mindfulness practices, such as meditation and deep breathing exercises, can help reduce stress levels and promote relaxation, potentially leading to a decrease in symptom severity.    2) Body awareness: Mindfulness practices encourage individuals to develop a heightened sense of body awareness. This can help individuals with FND notice subtle sensations and changes in their bodies, allowing them to better understand their symptoms and respond to them in a more adaptive way.    3) Emotional regulation: FND is often associated with emotional distress and difficulties in emotion regulation. Mindfulness practices can enhance emotional awareness and provide tools for effectively managing and regulating emotions, reducing the impact of emotional triggers on symptoms.    4) Coping with uncertainty: FND can be characterized by unpredictable symptoms and uncertainty. Mindfulness can help individuals cultivate an accepting and non-judgmental attitude towards their experiences, allowing them to navigate uncertainty with greater resilience and flexibility.    5) Enhanced self-care: Mindfulness practices promote self-care and self-compassion, encouraging individuals with FND to prioritize their well-being, engage in self-care activities, and approach themselves with kindness and understanding.      I understand that you're experiencing symptoms that are challenging and have been attributed to Functional Neurological Disorders (FND). It's important for me to emphasize that while these symptoms may be associated with 1) physical pain such as Diffuse Myofacial Pain Syndrome, 2) (and/or) psychological or emotional factors, such as distress/frustration from the symptoms itself, stress or trauma and 3)  and hyperactivity within amygdala, they are absolutely real and can significantly impact your daily life. As a neurologist, I want to assure you that your symptoms are valid and deserving of attention and care.    Functional Neurological Disorders involve a complex interaction between the brain and the body, resulting in various physical and sensory symptoms from physiological (not pathological) activation and inhibition of certain brain areas. These symptoms can be very disabling, affecting your ability to perform daily activities and impacting your overall well-being.    It's crucial to recognize that (physical pain such as Diffuse Myofacial Pain Syndrome,  psychological or emotional factors, such as distress/frustration from the symptoms itself, stress or trauma and yperactivity within amygdala) can play a role in triggering or exacerbating these symptoms. However, it doesn't mean that the symptoms are all in your head or not genuine. Stress can have a profound impact on the body, including the nervous system, and can manifest in various physical ways.    As your neurologist, I want you to know that I genuinely understand the distress and challenges you're facing. I believe in your experiences, and I'm committed to working with you to find the best possible solutions to manage and alleviate your symptoms.     Your symptoms are not a reflection of weakness or lack of effort on your part. They are an indication of the complexity of the mind-body connection and the individuality of each person's response to stress.    Please remember that you are not alone in this journey. Alongside your healthcare team, we will provide you with the support and guidance needed to improve your quality of life and help you regain control over your symptoms.          Useful informations:      Hermine Messick, PhD at [facility] - Impact of yoga and meditation on various pain, cognitive and behavioral functions.       Dr. 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 Patient had pain locally and in a referred pattern identified during exam. Patient also has restricted range of motion and limited benefits with stretching exercise tried by patient.       CPT codes 16109 - 3 or more identified triggers points      Trigger-point injection using lidocaine has been shown to be one of the most effective treatment modalities to inactivate trigger points and provide prompt relief of symptoms - as per following articles.      Nathaniel Man A, Kaya A, Ardicoglu O, [place] S, [place], [place]. Comparison of lidocaine injection, botulinum toxin injection, and dry needling to trigger points in myofascial pain syndrome. Rheumatol Int. 2005 Oct;25(8):604-11 and  Fransico Meadow, [place]. Trigger points: diagnosis and management. Am Fam Physician. 2002 Feb 15;65(4):653-60. PMID: [id number])      - follow up after these testing     Total time 45  minutes.  Preparing to see the patient, obtaining and/or reviewing separately obtained history, performing a medically appropriate examination and/or evaluation, counseling and educating the patient/family/caregiver, ordering medications, tests or procedures, documenting clinical information in the electronic health record, independently interpreting results (not separately reported) and communicating results to the patient/family/caregiver, care coordination.

## 2023-04-06 ENCOUNTER — Encounter: Admit: 2023-04-06 | Discharge: 2023-04-06 | Payer: MEDICAID | Primary: General Practice

## 2023-04-08 ENCOUNTER — Encounter: Admit: 2023-04-08 | Discharge: 2023-04-08 | Payer: MEDICAID | Primary: General Practice

## 2023-04-09 ENCOUNTER — Encounter: Admit: 2023-04-09 | Discharge: 2023-04-09 | Payer: MEDICAID | Primary: General Practice

## 2023-04-10 ENCOUNTER — Encounter: Admit: 2023-04-10 | Discharge: 2023-04-10 | Payer: MEDICAID | Primary: General Practice

## 2023-04-11 ENCOUNTER — Encounter: Admit: 2023-04-11 | Discharge: 2023-04-11 | Payer: MEDICAID | Primary: General Practice

## 2023-04-12 ENCOUNTER — Encounter: Admit: 2023-04-12 | Discharge: 2023-04-12 | Payer: MEDICAID | Primary: General Practice

## 2023-04-12 ENCOUNTER — Ambulatory Visit: Admit: 2023-04-12 | Discharge: 2023-04-13 | Payer: MEDICAID | Primary: General Practice

## 2023-04-12 DIAGNOSIS — R1314 Dysphagia, pharyngoesophageal phase: Secondary | ICD-10-CM

## 2023-04-12 NOTE — Progress Notes
Date of Service: 04/12/2023    Subjective:             Tina [name] is a 53 y.o. female.    History of Present Illness    Tina [name] is a very pleasant 53 y.o. woman who is referred by Dr. Dorris Carnes  for a complaint of dysphagia.      Tina [name] reports that she is having issues with her voice that predate her recent stroke.  She states that her voice comes and goes.  She reports that when talks, the voice can cut out.  This can last minutes to the rest of the day.  She does report that her voice feels as if it fatigues with use.  She does not note anterior neck discomfort with voicing.  She does find voicing to be effortful.  She states that she feels like she runs out of air at times with voicing.  She is on inhalers for her pulmonary disease.    She reports difficulties with swallowing.  Sometimes she feels like water and food sticks in her throat or chest.  She is avoiding specific foods due to difficulty swallowing - she tends to avoid pizza as it feels like it swells up in her throat.  She has never had an aspiration pneumonia.  She has reflux symptoms for which she takes [place].  at.  She reports that she takes TUMS for this.    Tina [name] returns today in follow up.  She feels like she is having more issues eating breads and meats.  She feel like food sticks at the base of her neck.  She tries to clear the bolus with water.  Occasionally, she regurgitates undigested foods stuffs.  She notes choking on liquids a few times a week.  She has had no pneumonias.  She is not losing weight unexpectedly (she is on pheniramine and Zepbound for weight loss).    Family History   Problem Relation Name Age of Onset    Cancer Father Oscer Izella Ybanez         My dad passed away of leukemi and Lung canceta and lung cancer and another form of cancer    Heart Failure Father Oscer Casee Knepp     Migraines Father Oscer Latina Frank     Alcohol liver disease Father Sianna Garofano         Was a heavy drinker throughout life. 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 and paralyzation    Arthritis Father Deyonna Fitzsimmons         Hands lagsand back    Back pain Father Oscer Malea Swilling         Had arthritis in his back he had been paralyzed at one point also was in a motorcycle accident and semi accident    Hypertension Father Brigida Scotti     Joint Pain Father Oscer Jareli Highland     High Cholesterol Father Oscer Caci Orren     Allergy-severe Father Oscer Erna Brossard     Hearing Loss Father Oscer Karman Biswell     Neck Pain Father Oscer Juliann Pares     Arthritis-rheumatoid Father Oscer Juliann Pares     Arthritis-rheumatoid Other patient     Seizures Other Uncle's son         [place] mal seizures    Mental Retardation Sister Mindy Manton     Migraines Sister Channel Papandrea  Tremor Sister Brindley Madarang     Diabetes Sister Lazaria Schaben     Alcohol liver disease Sister Madyn Ivins         Was a have drinker    Stroke Maternal Grandfather Murielle Stang     Arthritis Maternal Grandfather Shanesha Bednarz         Legs and hands back    Back pain Maternal Grandfather Carolie Mcilrath     Heart problem Maternal Grandfather Melyna Huron     Hypertension Maternal Grandfather Antonetta Clanton     Hearing Loss Maternal Grandfather Charnita Trudel     Hip Fracture Maternal Grandfather Sotiria Keast     Obesity Maternal Grandfather Ardelle Lesches     Stroke Paternal Grandmother Joell Buerger     Cancer Paternal Grandmother Lars Pinks         My grandmother had bone cancer cervical cancer    Heart Attack Paternal Grandmother Lanayah Gartley     Heart problem Paternal Grandmother Armya Westerhoff     Hypertension Paternal Grandmother Jinnie Onley     Joint Pain Paternal Grandmother Prakriti Carignan     Osteoporosis Paternal Grandmother Junella Domke     Arthritis Paternal Grandmother Elner Seifert         Had and her legs back a hands    Back pain Paternal Grandmother Joyann Spidle     Bleeding Disorders Paternal Grandmother Devanie Galanti         Blood clots /    High Cholesterol Paternal Grandmother Dare Spillman     Hearing Loss Paternal Grandmother Hala Narula     Blood Clots Paternal Grandmother Yatzari Jonsson     Hip Fracture Paternal Grandmother Zykira Matlack     Obesity Paternal Grandmother Daylee Delahoz     Seizures Other Sister's daughter         Currently found out she's having seizures    Migraines Other Sister's daughter     Alcohol liver disease Mother Benito Mccreedy         Severe alcoholic    Heart problem Brother Tamala Bari     Hypertension Brother Tamala Bari     Joint Pain Brother Tamala Bari     Osteoporosis Brother Tamala Bari     Obesity Brother Tamala Bari     Osteoporosis Sister Eunice Blase     Unknown to Patient Sister Cobie Leidner         Cleft palate    Multiple sclerosis Sister Charniece Venturino     Birth Defect Sister Eunice Blase         Cleft palate    Obesity Sister Rozena Fierro     Arthritis Sister Tammy         Hand    Cancer Sister Tammy         Cervical cancer and cervical cyst    Hypertension Sister Tammy     Bleeding Disorders Sister Tammy         Brain Aneurysm    Hypotension Sister Tammy     Unknown to Patient Sister Tammy         Sister had a brain aneurysm    Migraines Sister Tammy     Back pain Daughter Inetta Fermo         Chiari    Neck Pain Daughter Tina         Chair    Neurologic Disorder Daughter Inetta Fermo     Asthma Daughter Inetta Fermo     Birth Defect  Daughter Inetta Fermo         Chiari    Hearing Loss Daughter Inetta Fermo     Arthritis Maternal Grandmother Marland Mcalpine     Cancer Maternal Grandmother Marland Mcalpine         Breast Cancer    Osteoporosis Maternal Grandmother Marland Mcalpine     Stroke Maternal Grandmother Marland Mcalpine     High Cholesterol Maternal Grandmother Marjaret Imelda Dandridge     Obesity Maternal Grandmother Marjaret Kealohilani Maiorino     Unknown to Patient Son Reuel Boom         Autistic    Unknown to Patient Other Grandparent Brother Mardelle Matte         Polio    Birth Defect Other Grandparent Brother Mardelle Matte         Polio    Back pain Daughter Concepcion Living         Back problams    Joint Pain Daughter Concepcion Living         In her knees    Neck Pain Daughter Concepcion Living         Has Chiari Malformation    Neurologic Disorder Daughter Concepcion Living         A tumor or same thing on her brain / Chiari malformation       Social History     Socioeconomic History    Marital status: Divorced   Tobacco Use    Smoking status: Former     Current packs/day: 0.00     Average packs/day: 1.5 packs/day for 15.0 years (22.5 ttl pk-yrs)     Types: Cigarettes     Start date: 07/04/1979     Quit date: 07/04/1994     Years since quitting: 28.7    Smokeless tobacco: Former     Types: Chew   Substance and Sexual Activity    Alcohol use: Yes     Alcohol/week: 1.0 standard drink of alcohol     Types: 1 Glasses of wine per week     Comment: Socially    Drug use: Yes     Types: Marijuana, Methamphetamines, 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    AIRSUPRA 90-80 mcg/actuation HFA inhaler Inhale two puffs by mouth into the lungs every 12 hours as needed.    atorvastatin (LIPITOR) 40 mg tablet Take one tablet by mouth daily. Indications: excessive fat in the blood    benzonatate (TESSALON PERLES) 100 mg capsule Take one capsule by mouth three times daily as needed for Cough.    EPINEPHrine(+) (EPIPEN) 1 mg/mL injection pen Inject 0.3 mL into the muscle once as needed.    FLUoxetine (PROZAC) 20 mg capsule Take one capsule by mouth daily.    furosemide (LASIX) 20 mg tablet Take two tablets by mouth daily. 40MG     ibuprofen (MOTRIN) 800 mg tablet     levothyroxine (SYNTHROID) 112 mcg tablet Take one tablet by mouth daily 30 minutes before breakfast. lidocaine (LIDODERM) 5 % topical patch Apply one patch topically to affected area every 24 hours.    magnesium chloride (SLOW-MAG) 71.5 mg EC tablet Take one tablet by mouth daily.    ondansetron (ZOFRAN ODT) 4  mg rapid dissolve tablet     phentermine (ADIPEX-P) 37.5 mg tablet Take one tablet by mouth daily.    potassium chloride (KLOR-CON SPRINKLE) 10 mEq capsule Take one capsule by mouth daily.    prazosin (MINIPRESS) 2 mg capsule Take one capsule by mouth at bedtime daily.    pregabalin (LYRICA) 75 mg capsule Take one capsule by mouth twice daily.    ZEPBOUND 5 mg/0.5 mL injection pen Inject 0.5 mL under the skin every 7 days.     Vitals:    04/12/23 1031   BP: 117/76   Pulse: 80   Temp: 37.2 ?C (98.9 ?F)   SpO2: 93%   PainSc: Zero   Weight: 96.5 kg (212 lb 12.8 oz)   Height: 170.2 cm (5' 7)     Body mass index is 33.33 kg/m?Marland Kitchen     Physical Exam    EXAM:  Constitutional: Blood pressure 117/76, pulse 80, temperature 37.2 ?C (98.9 ?F), height 170.2 cm (5' 7), weight 96.5 kg (212 lb 12.8 oz), SpO2 93%.    alert, cooperative, and no distress  Neurologic: Alert and oriented x 3.    Cranial nerves II - XII grossly intact and symmetric.   Eyes:  Pupils equal round and reactive.       Extraocular eye movements intact and full.  Ears:  Pinna appear normal.  Bilateral hearing aids.    External auditory canals without significant cerumen or debris.    Tympanic membrane intact without effusion, retraction or perforation.  Nose:  Dorsum straight.  Nares patent.    On anterior rhinoscopy,  the mucosa is moist.  Anterior turbinates 2+.    Septum with dry central perforation.  No mucopurulent discharge or polyps.  Oral Cavity: Dentition is in fair repair.  Oral mucosa pink and moist.    No masses or lesions.      Tongue mobile.  Floor of mouth without mass or lesion.      Oropharynx: No mucosal masses or lesions.  Tonsils minimal tissue.    Soft palate elevates symmetrically.  Neck:  Soft, non-tender.  No appreciable lymphadenopathy.    Trachea midline.  Laryngeal landmarks palpable with normal crepitus.    Larynx elevates with swallowing.  Thyrohyoid muscles nontender.    Healed incision from thyroidectomy.  Respiratory: Breathing comfortably.  No suprasternal/suprclavicular retractions    or accessory muscle use.  No stridor.    PFT 01/09/23:           Assessment and Plan:    ASSESSMENT:  Encounter Diagnoses   Name Primary?    Pharyngoesophageal dysphagia Yes         PLAN:    I have reviewed my findings with Tina [name].    She returns today with concerns for increased dysphagia.  Her primary issues are with solids and breads, though she does report some intermittent choking on liquids.  She has not had a swallow study or EGD in recent history.  Of note, she does work with a speech/pathologist via home health services, who has been working with her on some of her voice and swallow concerns.    I would recommend that we begin with a video swallow study with esophageal follow-through.  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  Certainly her history of diabetes does put her at higher risk for dysmotility issues.  With her history of a traumatic brain injury, there are also concerns about her pharyngeal phase of  swallow.    Given the option of following up in person or via phone call, she prefers latter.  I will see her back after studies to review the results.

## 2023-04-13 ENCOUNTER — Encounter: Admit: 2023-04-13 | Discharge: 2023-04-13 | Payer: MEDICAID | Primary: General Practice

## 2023-04-15 ENCOUNTER — Encounter: Admit: 2023-04-15 | Discharge: 2023-04-15 | Payer: MEDICAID | Primary: General Practice

## 2023-04-15 ENCOUNTER — Ambulatory Visit: Admit: 2023-04-15 | Discharge: 2023-04-16 | Payer: MEDICAID | Primary: General Practice

## 2023-04-15 DIAGNOSIS — M5416 Radiculopathy, lumbar region: Secondary | ICD-10-CM

## 2023-04-15 NOTE — Progress Notes
[facility] CLINIC NOTE       SUBJECTIVE: Tina [name] presents for care regarding back and leg pain.  Pain is bilateral and intermittently sharp and dull.  Walking and standing exacerbates pain.  Pain is proved with rest.  She was recently started on Lyrica and is taking 75 mg twice per day and notes that her pain control is much improved.  Lumbar epidural steroid injections have reduced pain by about 50% for more than 3 months and she does not feel like she needs 1 scheduled.         Review of Systems    Current Outpatient Medications:     acetaminophen (TYLENOL PO), Take  by mouth., Disp: , Rfl:     AIRSUPRA 90-80 mcg/actuation HFA inhaler, Inhale two puffs by mouth into the lungs every 12 hours as needed., Disp: , Rfl:     atorvastatin (LIPITOR) 40 mg tablet, Take one tablet by mouth daily. Indications: excessive fat in the blood, Disp: 30 tablet, Rfl: 3    benzonatate (TESSALON PERLES) 100 mg capsule, Take one capsule by mouth three times daily as needed for Cough., Disp: , Rfl:     EPINEPHrine(+) (EPIPEN) 1 mg/mL injection pen, Inject 0.3 mL into the muscle once as needed., Disp: , Rfl:     FLUoxetine (PROZAC) 20 mg capsule, Take one capsule by mouth daily., Disp: , Rfl:     furosemide (LASIX) 20 mg tablet, Take two tablets by mouth daily. 40MG , Disp: , Rfl:     ibuprofen (MOTRIN) 800 mg tablet, , Disp: , Rfl:     levothyroxine (SYNTHROID) 112 mcg tablet, Take one tablet by mouth daily 30 minutes before breakfast., Disp: 90 tablet, Rfl: 3    lidocaine (LIDODERM) 5 % topical patch, Apply one patch topically to affected area every 24 hours., Disp: , Rfl:     magnesium chloride (SLOW-MAG) 71.5 mg EC tablet, Take one tablet by mouth daily., Disp: , Rfl:     ondansetron (ZOFRAN ODT) 4 mg rapid dissolve tablet, , Disp: , Rfl:     phentermine (ADIPEX-P) 37.5 mg tablet, Take one tablet by mouth daily., Disp: , Rfl:     potassium chloride (KLOR-CON SPRINKLE) 10 mEq capsule, Take one capsule by mouth daily., Disp: , Rfl:     prazosin (MINIPRESS) 2 mg capsule, Take one capsule by mouth at bedtime daily., Disp: , Rfl:     pregabalin (LYRICA) 75 mg capsule, Take one capsule by mouth twice daily., Disp: 60 capsule, Rfl: 5    ZEPBOUND 5 mg/0.5 mL injection pen, Inject 0.5 mL under the skin every 7 days., Disp: , Rfl:   Allergies   Allergen Reactions    Aspirin ANAPHYLAXIS    Pcn [Penicillins] ANAPHYLAXIS    Reglan [Metoclopramide] SEE COMMENTS     Severe leg shakiness    Adhesive Tape (Rosins) RASH    Sulfa (Sulfonamide Antibiotics) NAUSEA AND VOMITING    Omeprazole NAUSEA AND VOMITING    Prednisone NAUSEA AND VOMITING     Physical Exam  Vitals:    04/15/23 1320   PainSc: Two     Oswestry Total Score:: (Patient-Rptd) 18  Pain Score: Two  There is no height or weight on file to calculate BMI.  General: Alert and oriented, very pleasant female.   HEENT showed extraocular muscles were intact and no other abnormalities.  Unlabored breathing.  Regular rate and rhythm on CV exam.   There is full range of motion in the neck and torso  with high guys     IMPRESSION:  1. Lumbar radiculopathy          PLAN: Will continue with home exercise plan as prescribed by physical therapy and follow-up in 3 months or sooner if needed.      Visit Start 12:55 End Time 13:34.

## 2023-04-24 LAB — TSH WITH FREE T4 REFLEX
FREE T4: 1.4 ng/dL (ref 0.70–1.48)
TSH: 9.6 m[IU]/mL (ref 0.35–4.94)

## 2023-04-25 ENCOUNTER — Encounter: Admit: 2023-04-25 | Discharge: 2023-04-25 | Payer: MEDICAID | Primary: General Practice

## 2023-04-25 DIAGNOSIS — E890 Postprocedural hypothyroidism: Secondary | ICD-10-CM

## 2023-05-23 ENCOUNTER — Encounter: Admit: 2023-05-23 | Discharge: 2023-05-23 | Payer: MEDICAID | Primary: General Practice

## 2023-05-23 ENCOUNTER — Ambulatory Visit: Admit: 2023-05-23 | Discharge: 2023-05-23 | Payer: MEDICAID | Primary: General Practice

## 2023-05-23 DIAGNOSIS — R1314 Dysphagia, pharyngoesophageal phase: Secondary | ICD-10-CM

## 2023-05-23 MED ADMIN — BARIUM SULFATE 98 % PO SUSR [136835]: 40 mL | ORAL | @ 16:00:00 | Stop: 2023-05-23 | NDC 32909076401

## 2023-05-23 MED ADMIN — BARIUM SULFATE 81 % (W/W) PO POWD [174091]: 50 mL | ORAL | @ 16:00:00 | Stop: 2023-05-23 | NDC 32909010510

## 2023-05-23 MED ADMIN — BARIUM SULFATE 40 % (W/V), 30% (W/W) PO PSTE [173397]: 10 mL | ORAL | @ 16:00:00 | Stop: 2023-05-23 | NDC 32909012522

## 2023-05-23 MED ADMIN — BARIUM SULFATE 700 MG PO TAB [301158]: 700 mg | ORAL | @ 16:00:00 | Stop: 2023-05-23 | NDC 10361077831

## 2023-05-23 MED ADMIN — BARIUM SULFATE 40 % (W/V) PO SUSP [95015]: 50 mL | ORAL | @ 16:00:00 | Stop: 2023-05-23 | NDC 32909011500

## 2023-05-23 NOTE — Progress Notes
 [facility]  Modified Barium Swallow (MBS) Evaluation [phone])  Staff:  Jeanett Schlein, M.D.  Date of Service:  05/23/2023    Tina [name] is a 53 y.o. female, referred to this service by Dr. Marisa Sprinkles for a speech pathology modified barium swallow evaluation with a diagnosis of dysphagia. The patient's past medical history is significant for stroke.  Patient is currently receiving 100% of her nutrition and hydration by mouth.  The patient's current diet level is most consistent with FOIS Scale 7: Total oral diet with no restrictions.  Patient reports difficulty with solids feeling caught (points to lower throat and mid chest area). Sometimes she coughs on liquids. The purpose of this evaluation is to assess the efficiency and safety of the swallow mechanism and to provide a plan for intervention as warranted.    EVALUATION RESULTS:  A cursory oral mechanism was completed. Labial function was within normal limits.  Lingual function was within normal limits.  Dentition is appropriate for a regular diet.    Velar movements were present and symmetrical.   Jaw ROM was adequate for a regular diet.   Upon palpation of a dry swallow, laryngeal elevation seemed prompt and adequate.        Lateral Views - Patient was presented with the following consistencies during this evaluation: thin liquid, nectar-thick liquid, pudding, and cracker. Administration was given by the patient via spoon, side of cup.      Patient?s oral phase of swallow was within normal limits. Lip closure for intraoral bolus containment resulted in no labial escape.  Tongue control during bolus hold maintained a cohesive bolus held between tongue to palate seal.  Bolus preparation and mastication resulted in timely and efficient chewing and mashing.  Bolus transport/lingual motion was with brisk tongue motion.  Oral residue was a collection on oral structures.     Patient?s pharyngeal phase of swallow was minimally impaired. Initiation of the pharyngeal swallow occured when the bolus head was in the pyriform sinuses.  Soft palate elevation resulted in no bolus between the soft palate and the pharyngeal wall.  Laryngeal elevation demonstrated complete superior movement of the thyroid cartilage with complete approximation of the arytenoids to the epiglottic  petiole.  Anterior hyoid excursion demonstrated complete anterior movement.  Epiglottic movement resulted in complete inversion.  Laryngeal vestibular closure was complete, as indicated by no air or contrast within the laryngeal vestibule at the height of the swallow.  Pharyngeal stripping wave was present and complete.  Pharyngeal contraction was complete.   Pharyngoesophageal segment (cricopharyngeal) opening demonstrated partial distention /partial duration, with partial obstruction of bolus flow.  Tongue base retraction allowed no contrast between the retracted tongue base and the posterior pharyngeal wall.  Pharyngeal residue was not present. There was complete pharyngeal clearance.  Pharyngeal and laryngeal sensation was adequate.    No penetration or aspiration observed on any consistency tested. This is consistent with Rosenbek Scale: 1 - Material does not enter the airway.     Anterior-Posterior Views - AP view completed with pudding consistency.  Esophageal clearance in the upright position resulted in esophageal retention.     The following swallowing maneuvers were attempted during this evaluation: no swallow maneuvers were attempted.        Small amount of nectar thick retention below the pharyngoesophageal segment (at the level of C5)     Esophageal retention observed on AP view    DIGEST SCORING:    Overall score: 0; normal oropharyngeal swallow  Safety score: 0; no penetration or aspiration  Efficiency score: 0; no pharyngeal residue    IMPRESSIONS:  Based on the above findings, Tina [name] presents with Noms level 7 functional pharyngeal swallow, which is consistent with DIGEST 0: S0 E0, however with esophageal findings (see images above). There was slight retention below the pharyngoesophageal segment and esophageal retention observed. No penetration or aspiration was observed on any consistency tested. Esophagram completed, indicating mild esophageal dysmotility. Refer to radiology report for further details.    RECOMMENDATIONS/GOALS:    Long term goal: Patient will tolerate least restrictive diet without signs or symptoms of aspiration.    The following recommendations and therapy goals should be implemented:  Diet:Total oral diet    IDDSI Diet Recommendation:  Liquid: 0 - Thin  Solid: 7 - Regular    The recommended diet level is most consistent with FOIS Scale 7: Total oral diet with no restrictions.  Please note, if there are any recommendations to modify or crush medications, please check with your pharmacist before making changes.  1: Follow up with your physician as directed.    These findings and recommendations were discussed with this patient and referring physician.    The ENT speech pathology service plans to continue to follow this patient in the future for any potential/ongoing outpatient voice, swallowing, articulation or trismus related care as appropriate.  Please note that the patient may be referred to outside services should patient's plan of care require skills outside ENT speech pathologists specialty.

## 2023-05-24 ENCOUNTER — Encounter: Admit: 2023-05-24 | Discharge: 2023-05-24 | Payer: MEDICAID | Primary: General Practice

## 2023-05-29 ENCOUNTER — Encounter: Admit: 2023-05-29 | Discharge: 2023-05-29 | Payer: MEDICAID | Primary: General Practice

## 2023-05-30 ENCOUNTER — Encounter: Admit: 2023-05-30 | Discharge: 2023-05-30 | Payer: MEDICAID | Primary: General Practice

## 2023-06-01 ENCOUNTER — Encounter: Admit: 2023-06-01 | Discharge: 2023-06-01 | Payer: MEDICAID | Primary: General Practice

## 2023-06-03 ENCOUNTER — Encounter: Admit: 2023-06-03 | Discharge: 2023-06-03 | Payer: MEDICAID | Primary: General Practice

## 2023-06-03 NOTE — Progress Notes
 Call out to patient regarding results of recent video swallow study.  Left message on voicemail with callback information.  Will also send message via MyChart.    Video swallow study shows a normal pharyngeal swallow.  On the AP view, however, there is some mild esophageal residue.  I do not see record of an EGD in our system.  If she has not had 1 in the last 5 years, we could certainly consider referral to GI for EGD.

## 2023-06-04 ENCOUNTER — Encounter: Admit: 2023-06-04 | Discharge: 2023-06-04 | Payer: MEDICAID | Primary: General Practice

## 2023-06-04 ENCOUNTER — Ambulatory Visit: Admit: 2023-06-04 | Discharge: 2023-06-04 | Payer: MEDICAID | Primary: General Practice

## 2023-06-05 ENCOUNTER — Encounter: Admit: 2023-06-05 | Discharge: 2023-06-05 | Payer: MEDICAID | Primary: General Practice

## 2023-06-07 ENCOUNTER — Encounter: Admit: 2023-06-07 | Discharge: 2023-06-07 | Payer: MEDICAID | Primary: General Practice

## 2023-06-10 ENCOUNTER — Encounter: Admit: 2023-06-10 | Discharge: 2023-06-10 | Payer: MEDICAID | Primary: General Practice

## 2023-06-10 ENCOUNTER — Ambulatory Visit: Admit: 2023-06-10 | Discharge: 2023-06-10 | Payer: MEDICAID | Primary: General Practice

## 2023-06-11 ENCOUNTER — Encounter: Admit: 2023-06-11 | Discharge: 2023-06-11 | Payer: MEDICAID | Primary: General Practice

## 2023-06-11 ENCOUNTER — Ambulatory Visit: Admit: 2023-06-11 | Discharge: 2023-06-11 | Payer: MEDICAID | Primary: General Practice

## 2023-06-11 MED ORDER — ESOMEPRAZOLE MAGNESIUM 40 MG PO CPDR
40 mg | ORAL_CAPSULE | Freq: Two times a day (BID) | ORAL | 0 refills | 30.00000 days | Status: AC
Start: 2023-06-11 — End: ?

## 2023-06-12 ENCOUNTER — Encounter: Admit: 2023-06-12 | Discharge: 2023-06-12 | Payer: MEDICAID | Primary: General Practice

## 2023-06-12 NOTE — Progress Notes
 Call to pt to discuss results of EGD.  Grade C esophagitis.  GI is working on medication that she can tolerate (prior history of PPI intolerance) and plans to rescope in 3-4 months to assess healing.  Patient requested review of biopsy results.  No EoE.  Mild chronic gastritis.  No further questions.

## 2023-06-13 ENCOUNTER — Encounter: Admit: 2023-06-13 | Discharge: 2023-06-13 | Payer: MEDICAID | Primary: General Practice

## 2023-06-22 ENCOUNTER — Encounter: Admit: 2023-06-22 | Discharge: 2023-06-22 | Payer: MEDICAID | Primary: General Practice

## 2023-07-02 ENCOUNTER — Ambulatory Visit: Admit: 2023-07-02 | Discharge: 2023-07-03 | Payer: MEDICAID | Primary: General Practice

## 2023-07-02 ENCOUNTER — Encounter: Admit: 2023-07-02 | Discharge: 2023-07-02 | Payer: MEDICAID | Primary: General Practice

## 2023-07-02 DIAGNOSIS — M7989 Other specified soft tissue disorders: Secondary | ICD-10-CM

## 2023-07-02 DIAGNOSIS — R609 Edema, unspecified: Secondary | ICD-10-CM

## 2023-07-02 NOTE — Patient Instructions
 Follow-Up:    -Thank you for allowing us  to participate in your care today. Your After Visit Summary is being completed by Lorilee Rooks, RN.    -We would like you to follow up in  6 months with Guadelupe Leech, APRN  -The schedule is released approximately 4-5 months in advance. You will be called by our scheduling department to make an appointment and you will also receive a notification via MyChart to self-schedule.  However, if you would like to call to make this appointment, please call [phone].    -Please schedule the following testing at check out, or by calling our scheduling line: Bilateral lower extremity venous insufficiency ultrasound    Changes From Today's Office Visit      We are sending a referral for a Lymphedema Pump to [facility] : [phone]. It can take 2-4 weeks for them to process this referral through insurance. They will contact you to coordinate a meeting and fitting for the machine.      External referral for lymphedema therapy    Contacting our office:    -Business Hours: Monday-Friday, 8:00 am-4:30 pm (excluding Holidays).     -For medical questions or concerns, please send us  a message through your MyChart account or call the [facility] team nursing triage line at [phone]. Please leave a detailed message with your name, date of birth, and reason for your call.  If your message is received before 3:30pm, every effort will be made to call you back the same day.  Please allow time for us  to review your chart prior to call back.     -For medication refills please start by contacting your pharmacy. You can also send us  a prescription question through your MyChart or call the nurse triage line above.     [facility] nursing team fax number: [phone]    -You may receive a survey in the upcoming weeks from [facility]. Your feedback is important to us  and helps us  continue to improve patient care and patient satisfaction.     -Please feel free to call our Financial Department at [phone] with any questions or concerns about estimated cost of testing or imaging ordered today. We are happy to provide CPT codes upon request.    Results & Testing Follow Up:    -Please allow 5-7 business days for the results of any testing to be reviewed. Please call our office if you have not heard from a nurse within this time frame.    -Should you choose to complete testing at an outside facility, please contact our office after completion of testing so that we can ensure that we have received results for your provider to review.    Lab and test results:  As a part of the CARES act, starting 06/04/2019, some results will be released to you via MyChart immediately and automatically.  You may see results before your provider sees them; however, your provider will review all these results and then they, or one of their team, will notify you of result information and recommendations.   Critical results will be addressed immediately, but otherwise, please allow us  time to get back with you prior to you reaching out to us  for questions.  This will usually take about 72 hours for labs and 5-7 days for procedure test results.

## 2023-07-02 NOTE — Progress Notes
 Referral Faxed to [facility]: [phone] sent 07/02/2023 12:07 PM      Fax Confirmation Received:

## 2023-07-03 DIAGNOSIS — I890 Lymphedema, not elsewhere classified: Secondary | ICD-10-CM

## 2023-07-03 DIAGNOSIS — R55 Syncope and collapse: Secondary | ICD-10-CM

## 2023-07-03 DIAGNOSIS — E6609 Other obesity due to excess calories: Secondary | ICD-10-CM

## 2023-07-03 DIAGNOSIS — Z136 Encounter for screening for cardiovascular disorders: Secondary | ICD-10-CM

## 2023-07-03 DIAGNOSIS — I10 Essential (primary) hypertension: Secondary | ICD-10-CM

## 2023-07-10 ENCOUNTER — Encounter: Admit: 2023-07-10 | Discharge: 2023-07-10 | Payer: MEDICAID | Primary: General Practice

## 2023-07-16 ENCOUNTER — Encounter: Admit: 2023-07-16 | Discharge: 2023-07-16 | Payer: MEDICAID | Primary: General Practice

## 2023-07-16 NOTE — Telephone Encounter
 Patient reports continue episodes history of the same.  Reports that mychart message sent to multiple specialities.  Patient was seen and revaluated in ER.  Vitals ekg monitor labs were normal patient is to follow up with pcp.

## 2023-07-17 ENCOUNTER — Encounter: Admit: 2023-07-17 | Discharge: 2023-07-17 | Payer: MEDICAID | Primary: General Practice

## 2023-07-17 ENCOUNTER — Ambulatory Visit: Admit: 2023-07-17 | Discharge: 2023-07-17 | Payer: MEDICAID | Primary: General Practice

## 2023-07-17 DIAGNOSIS — J4540 Moderate persistent asthma, uncomplicated: Secondary | ICD-10-CM

## 2023-07-17 NOTE — Progress Notes
 Outpatient Pulmonary Medicine Note  Date of Service: 07/17/2023  Her PCP is Pearlene Bouchard.     Initial Pulmonary Consult: Asthma evaluation (10/08/2018)   Reason for today's visit: Six month asthma follow up    History of Present Illness   Initial consultation information:  Tina [name] is a 53 y.o. female with hypertension, hypothyroidism, and neurocardiogenic syncope has been following up in our clinic for the management of asthma. She was last seen by Dr. Anell Keep 01/2023. Today, reporting that she is concerned about her oxygen saturations, particularly overnight. Would like to see if she would qualify for supplemental O2. Discussed transitioning to maintenance inhaler instead of PRN airsupra, she wants to hold off on pursuing this at this time. Reports significant weakness since her stroke, working with PT. During some syncopal episodes, reports large variations in pulse and SpO2  07/2022:  Significant improvement in respiratory symptoms. Only uses pulmicort as-needed (~2x/week)  No ongoing triggers. States that due to abdominal issues, all her medical weight loss Rx are on hold.    11/2022:  Admitted to Neuro-ICU for concern for stroke - got TNK.  MRI without evidence of stroke. 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  Dc'd phentermine and Topamax  Continues wegovy 2.4 mg weekly    01/2023:  Uses her AirSupra roughly twice a day. No wheezing or worsening dyspnea.     07/2023  Continues to use AirSupra twice per day. Worried that she may need supplemental oxygen, particularly at night.     Review of Systems   Constitutional:  Positive for malaise/fatigue.   Neurological:  Positive for speech change, focal weakness and weakness.   A 12-point ROS was completed and negative except for what was mentioned in HPI    Vital Signs and Physical Exam     Vitals:    07/17/23 1410   BP: 107/65   BP Source: Arm, Right Upper   Pulse: 74   Temp: 36.4 ?C (97.6 ?F)   Resp: 16   SpO2: 100%   TempSrc: Oral   PainSc: Three   Weight: 89.8 kg (198 lb)   Height: 170.2 cm (5' 7)     Physical Exam  Vitals reviewed.   Constitutional:       General: She is not in acute distress.     Appearance: Normal appearance.   HENT:      Head: Normocephalic and atraumatic.      Mouth/Throat:      Mouth: Mucous membranes are moist.   Eyes:      Extraocular Movements: Extraocular movements intact.      Conjunctiva/sclera: Conjunctivae normal.   Cardiovascular:      Rate and Rhythm: Normal rate and regular rhythm.   Pulmonary:      Effort: Pulmonary effort is normal. No respiratory distress.   Abdominal:      General: There is no distension.   Musculoskeletal:      Cervical back: Neck supple.      Right lower leg: No edema.      Left lower leg: No edema.   Skin:     General: Skin is warm and dry.   Neurological:      Mental Status: She is alert and oriented to person, place, and time.      Motor: Weakness present.   Psychiatric:         Mood and Affect: Mood normal.         Behavior: Behavior normal.        Labs,  PFT, Current Medications, and Imaging     Labs:   Lab Results   Component Value Date/Time    HGB 12.7 12/10/2022 12:00 AM    WBC 5.66 12/10/2022 12:00 AM    CR 0.65 11/18/2022 02:06 AM    AEC 0.17 11/18/2022 02:06 AM    AEC 0.16 11/17/2022 03:03 PM    AEC 0.21 02/04/2019 03:27 PM    AEC 0.20 10/23/2016 02:13 PM    AEC 0.50 (H) 08/07/2016 01:52 PM    IGE 184 (H) 02/04/2019 03:27 PM    HGBA1C 5.6 11/17/2022 03:03 PM        Pulmonary Function Test:   Complete PFT Absolute FVC-Pre FEV1-Pre DLCOunc-%Pred-Pre   Latest Ref Rng & Units L L %   02/04/2019  11:44 AM 4.14  3.66  118    06/04/2019   3:01 PM 4.15  3.52     01/09/2023   3:32 PM 4.29  3.68        Complete PFT Percent Predicted FVC-%Pred-pre FEV1-%Pred-Pre DLCOunc-%Pred-Pre   Latest Ref Rng & Units % % %   02/04/2019  11:44 AM 108  120  118    06/04/2019   3:01 PM 108  115     01/09/2023   3:32 PM 121  130        Spirometry 01/09/2023:  FVC 4.29L, 121%  FEV1 3.68L, 130%  Ratio 86%    PFT: 08/31/2016 - completed at [facility]  FVC      4.15 (110%) -> 4.11 (109%)  FEV1    3.71 (123%) -> 3.55 (118%)  Ratio    89% -> 86%  RV        1.66 (90%)  TLC       5.8 (109%)  DLCO    95%     Methacholine challenge 06/04/2019:  FEV1 decreased by 21% after a total dose of 4 mg/mL (mild airway hyper-responsiveness)    Current Medication List:   AIRSUPRA Hfaa  atorvastatin  EPINEPHrine  esomeprazole DR  furosemide  ibuprofen  levothyroxine  lidocaine  ondansetron  phentermine  potassium chloride  prazosin  pregabalin  SLOW-MAG Tbec  TYLENOL PO  ZEPBOUND     Radiology and other diagnostic tests:   I personally reviewed all images pertaining to this consult.     OSH CXR 10/05/2018 - completed at [facility]  No acute cardiopulmonary pathology     TTE 06/05/2022:  LV and RV size and function are normal. LVEF 58%  G1DD. PASP unable to be assessed due to poor TR signal.   Assessment and Plan      Mild-intermittent asthma, T2 phenotype  Shortness of breath  Functional neurologic disorder  Chronic migraine    Continue PRN AirSupra  Discussed initiating maintenance inhaler, she would like to defer at this time  Will order exercise oximetry and noc ox    Orders Placed This Encounter    PFT EXCERCISE WITH OXIMETRY     RTC 6 months or sooner PRN with Dr. Anell Keep. Discussed & evaluated with Dr. Starlet Eaves.    Orvin Netter, MD (Fellow)  Pulmonary & Critical Care

## 2023-07-17 NOTE — Patient Instructions
 It was a pleasure seeing you today.      My nurse is Clemens Catholic, Charity fundraiser.  She can be reached at [phone].    Please contact my nurse with any signs and symptoms of worsening productive cough with thick secretions, blood in sputum, chest pain or tightness, shortness of breath, fever, chills, night sweats, or any questions or concerns.    For refills on medications, please have your pharmacy request a refill authorization either electronically or via fax to our office at [phone]. Please allow at least 3 business days for refill requests.    For urgent issues after business hours, weekends, or holidays please call [phone] and request for the pulmonary fellow to be paged.    If you need to cancel or reschedule an appointment, please call [phone].

## 2023-07-18 ENCOUNTER — Encounter: Admit: 2023-07-18 | Discharge: 2023-07-18 | Payer: MEDICAID | Primary: General Practice

## 2023-07-18 DIAGNOSIS — J4540 Moderate persistent asthma, uncomplicated: Secondary | ICD-10-CM

## 2023-07-18 NOTE — Telephone Encounter
-----   Message from Abigail Spaedy, MD sent at 07/17/2023  3:15 PM CDT -----  Joey Muster!Would you be able to help in getting Ms. Chirco arranged for a nocturnal oximetry?Thanks!Abigail

## 2023-07-18 NOTE — Telephone Encounter
 Order: noc ox on room air  DME: [employer]     Will continue to follow up.  **Tina [name], [facility]

## 2023-07-18 NOTE — Telephone Encounter
 Order for noc ox on room air  placed as discussed per office note on 07/17/2023    DME: [employer]    Order faxed via [employer].  Routing to [employer], [place] to follow up on order status.

## 2023-07-22 ENCOUNTER — Encounter: Admit: 2023-07-22 | Discharge: 2023-07-22 | Payer: MEDICAID | Primary: General Practice

## 2023-07-31 ENCOUNTER — Encounter: Admit: 2023-07-31 | Discharge: 2023-07-31 | Payer: MEDICAID | Primary: General Practice

## 2023-08-02 ENCOUNTER — Encounter: Admit: 2023-08-02 | Discharge: 2023-08-02 | Payer: MEDICAID | Primary: General Practice

## 2023-08-05 ENCOUNTER — Encounter: Admit: 2023-08-05 | Discharge: 2023-08-05 | Payer: MEDICAID | Primary: General Practice

## 2023-08-12 ENCOUNTER — Encounter: Admit: 2023-08-12 | Discharge: 2023-08-12 | Payer: MEDICAID | Primary: General Practice

## 2023-08-14 ENCOUNTER — Ambulatory Visit: Admit: 2023-08-14 | Discharge: 2023-08-14 | Payer: MEDICAID | Primary: General Practice

## 2023-08-14 ENCOUNTER — Encounter: Admit: 2023-08-14 | Discharge: 2023-08-14 | Payer: MEDICAID | Primary: General Practice

## 2023-08-15 ENCOUNTER — Encounter: Admit: 2023-08-15 | Discharge: 2023-08-15 | Payer: MEDICAID | Primary: General Practice

## 2023-08-20 ENCOUNTER — Encounter: Admit: 2023-08-20 | Discharge: 2023-08-20 | Payer: MEDICAID | Primary: General Practice

## 2023-08-20 ENCOUNTER — Ambulatory Visit: Admit: 2023-08-20 | Discharge: 2023-08-21 | Payer: MEDICAID | Primary: General Practice

## 2023-08-20 DIAGNOSIS — S069XAD Traumatic brain injury, with unknown loss of consciousness status, subsequent encounter: Secondary | ICD-10-CM

## 2023-08-20 DIAGNOSIS — F447 Conversion disorder with mixed symptom presentation: Secondary | ICD-10-CM

## 2023-08-20 DIAGNOSIS — I890 Lymphedema, not elsewhere classified: Secondary | ICD-10-CM

## 2023-08-20 NOTE — Assessment & Plan Note
 Autonomic testing in January, 2025, was completely normal.

## 2023-08-20 NOTE — Assessment & Plan Note
 I tried to reassure Tina [name] that her heart and cardiovascular system appears normal.  I don't believe she needs ongoing follow-up with Cardiology.
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
## 2023-08-20 NOTE — Assessment & Plan Note
 She monitors BP, HR, and oxygen saturation somewhat obsessively.  While there are some variations, her BP appears to average < 130/80.

## 2023-08-20 NOTE — Assessment & Plan Note
 She seems to be having good results from compression therapy.

## 2023-08-21 ENCOUNTER — Encounter: Admit: 2023-08-21 | Discharge: 2023-08-21 | Payer: MEDICAID | Primary: General Practice

## 2023-08-21 ENCOUNTER — Ambulatory Visit: Admit: 2023-08-21 | Discharge: 2023-08-22 | Payer: MEDICAID | Primary: General Practice

## 2023-08-21 DIAGNOSIS — E559 Vitamin D deficiency, unspecified: Secondary | ICD-10-CM

## 2023-08-21 DIAGNOSIS — Z9889 Other specified postprocedural states: Secondary | ICD-10-CM

## 2023-08-21 DIAGNOSIS — E890 Postprocedural hypothyroidism: Secondary | ICD-10-CM

## 2023-08-21 DIAGNOSIS — E039 Hypothyroidism, unspecified: Secondary | ICD-10-CM
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
## 2023-08-21 NOTE — Patient Instructions
 Thank you for meeting with me today.  It was great to see you!    Plans we discussed during our visit:    1) Check TSH levels  2) Adjust levothyroxine based on this result.  3) Plan to do a bone density again February 2026    Please message me in My Chart or contact my nurse Christy at [phone] with any questions.    Randine CHRISTELLA Mage, APRN-NP

## 2023-08-21 NOTE — Progress Notes
 I have flagged medications for the provider to review/remove from medication list.  Thanks

## 2023-08-21 NOTE — Telephone Encounter
-----   Message from Randine CHRISTELLA Mage, APRN-NP sent at 08/21/2023  2:39 PM CDT -----  Regarding: Lab orders  Please send lab orders to [facility] in [place], [place].

## 2023-08-21 NOTE — Telephone Encounter
 Lab orders faxed to [phone].

## 2023-08-22 ENCOUNTER — Encounter: Admit: 2023-08-22 | Discharge: 2023-08-22 | Payer: MEDICAID | Primary: General Practice

## 2023-08-27 ENCOUNTER — Encounter: Admit: 2023-08-27 | Discharge: 2023-08-27 | Payer: MEDICAID | Primary: General Practice

## 2023-09-10 ENCOUNTER — Encounter: Admit: 2023-09-10 | Discharge: 2023-09-10 | Payer: MEDICAID | Primary: General Practice

## 2023-09-16 ENCOUNTER — Encounter: Admit: 2023-09-16 | Discharge: 2023-09-16 | Payer: MEDICAID | Primary: General Practice

## 2023-09-16 ENCOUNTER — Ambulatory Visit: Admit: 2023-09-16 | Discharge: 2023-09-16 | Payer: MEDICAID | Primary: General Practice

## 2023-09-17 ENCOUNTER — Encounter: Admit: 2023-09-17 | Discharge: 2023-09-17 | Payer: MEDICAID | Primary: General Practice

## 2023-09-17 ENCOUNTER — Ambulatory Visit: Admit: 2023-09-17 | Discharge: 2023-09-17 | Payer: MEDICAID | Primary: General Practice

## 2023-09-17 MED ORDER — PROPOFOL 10 MG/ML IV EMUL 20 ML (INFUSION)(AM)(OR)
INTRAVENOUS | 0 refills | Status: DC
Start: 2023-09-17 — End: 2023-09-17

## 2023-09-17 MED ORDER — PROPOFOL INJ 10 MG/ML IV VIAL
INTRAVENOUS | 0 refills | Status: DC
Start: 2023-09-17 — End: 2023-09-17

## 2023-09-17 MED ORDER — DEXMEDETOMIDINE IN 0.9 % NACL 20 MCG/5 ML (4 MCG/ML) IV SYRG
INTRAVENOUS | 0 refills | Status: DC
Start: 2023-09-17 — End: 2023-09-17

## 2023-09-17 MED ORDER — GLYCOPYRROLATE 0.2 MG/ML IJ SOLN
INTRAVENOUS | 0 refills | Status: DC
Start: 2023-09-17 — End: 2023-09-17

## 2023-09-17 MED ORDER — LIDOCAINE (PF) 20 MG/ML (2 %) IJ SOLN
INTRAVENOUS | 0 refills | Status: DC
Start: 2023-09-17 — End: 2023-09-17

## 2023-09-18 ENCOUNTER — Encounter: Admit: 2023-09-18 | Discharge: 2023-09-18 | Payer: MEDICAID | Primary: General Practice

## 2023-10-23 ENCOUNTER — Ambulatory Visit: Admit: 2023-10-23 | Discharge: 2023-10-24 | Payer: MEDICAID | Primary: General Practice

## 2023-10-23 ENCOUNTER — Encounter: Admit: 2023-10-23 | Discharge: 2023-10-23 | Payer: MEDICAID | Primary: General Practice

## 2023-10-23 DIAGNOSIS — G4734 Idiopathic sleep related nonobstructive alveolar hypoventilation: Secondary | ICD-10-CM

## 2023-10-23 DIAGNOSIS — K21 Gastroesophageal reflux disease with esophagitis without hemorrhage: Secondary | ICD-10-CM

## 2023-10-23 MED ORDER — BUDESONIDE-FORMOTEROL 80-4.5 MCG/ACTUATION IN HFAA
2 | Freq: Two times a day (BID) | RESPIRATORY_TRACT | 11 refills | 30.00000 days | Status: AC
Start: 2023-10-23 — End: ?

## 2023-10-23 NOTE — Patient Instructions
 It was a pleasure seeing you today.      My nurse is Clemens Catholic, Charity fundraiser.  She can be reached at [phone].    Please contact my nurse with any signs and symptoms of worsening productive cough with thick secretions, blood in sputum, chest pain or tightness, shortness of breath, fever, chills, night sweats, or any questions or concerns.    For refills on medications, please have your pharmacy request a refill authorization either electronically or via fax to our office at [phone]. Please allow at least 3 business days for refill requests.    For urgent issues after business hours, weekends, or holidays please call [phone] and request for the pulmonary fellow to be paged.    If you need to cancel or reschedule an appointment, please call [phone].

## 2023-10-24 DIAGNOSIS — J4520 Mild intermittent asthma, uncomplicated: Principal | ICD-10-CM

## 2023-11-05 ENCOUNTER — Encounter: Admit: 2023-11-05 | Discharge: 2023-11-05 | Payer: MEDICAID | Primary: General Practice

## 2023-11-07 ENCOUNTER — Encounter: Admit: 2023-11-07 | Discharge: 2023-11-07 | Payer: MEDICAID | Primary: General Practice

## 2023-11-07 DIAGNOSIS — E039 Hypothyroidism, unspecified: Principal | ICD-10-CM

## 2023-11-07 MED ORDER — LEVOTHYROXINE 137 MCG PO TAB
137 ug | ORAL_TABLET | Freq: Every day | ORAL | 1 refills | 30.00000 days | Status: AC
Start: 2023-11-07 — End: ?

## 2023-11-11 ENCOUNTER — Encounter: Admit: 2023-11-11 | Discharge: 2023-11-11 | Payer: MEDICAID | Primary: General Practice

## 2023-11-13 ENCOUNTER — Encounter: Admit: 2023-11-13 | Discharge: 2023-11-13 | Payer: MEDICAID | Primary: General Practice

## 2023-11-13 ENCOUNTER — Ambulatory Visit: Admit: 2023-11-13 | Discharge: 2023-11-13 | Payer: MEDICAID | Primary: General Practice

## 2023-11-13 VITALS — BP 97/76

## 2023-11-13 DIAGNOSIS — F447 Conversion disorder with mixed symptom presentation: Principal | ICD-10-CM

## 2023-11-13 MED ORDER — PREGABALIN 75 MG PO CAP
75 mg | ORAL_CAPSULE | Freq: Two times a day (BID) | ORAL | 5 refills | 30.00000 days | Status: AC
Start: 2023-11-13 — End: ?

## 2023-11-13 NOTE — Progress Notes
 Tele appointment : patient was seen in  a tele-health setting using real-time audio-video communication        Subjective:        Tina [name] is a 53 y.o. female with weakness/falls and symcope/seizure like activity.          History of Present Illness    11/13/2023    Patient had few episodes of seizure like activity and weakness in all four extremities.   Engaged in CBT and physical therapy.   Pending sleep study.       04/04/2023      Weakness in bilateral legs for last three to four months   Several falls   EMG/NCS - normal.   Sensory disturbances.       12/12/2022    Weakness in bilateral legs for last three to four months   Several falls     Other symptoms - Lightheadedness - black out and then she will pass out - she does not remember how long she will pass out.       EMG/NCS in 2021 was consistent with neuropathy, though was ultimately felt to represent technical effect from selling during her visit with Dr. Colene.     Later had skin biopsy which showed normal IENFD.     Patient was recently seen at neuromuscular specialist - based on the exam she did not have deficits to pin, vibration, or abnormal reflexes. Based on symptoms, exam, and information we have at this time she does not appear to have neuropathy     CTA head:     Patent intracranial arterial vasculature without large vessel occlusion or   significant stenosis.     CTA neck:     Patent cervical carotid and vertebral vasculature.     Autonomic testing with Tilt table test - unremarkable     Review of Systems    Past Medical History:    Acid reflux    ADHD (attention deficit hyperactivity disorder)    Alcoholism (CMS-HCC)    Anxiety    Arnold-Chiari malformation (CMS-HCC)    Arthritis    Asthma    Blood clotting disorder    Cancer (CMS-HCC)    Chest pain    Chiari I malformation (CMS-HCC)    Chiari malformation    Chronic back pain    COPD (chronic obstructive pulmonary disease) (CMS-HCC)    CVA (cerebral vascular accident) (CMS-HCC)    Deep vein thrombosis (DVT) (CMS-HCC)    Degenerative arthritis of spine    Degenerative disc disease, cervical    Degenerative disc disease, lumbar    Degenerative disc disease, thoracic    Depression    Diverticulitis of colon (without mention of hemorrhage)(562.11)    Dizziness    Falls    Fibromyalgia    Fibromyalgia    Generalized anxiety disorder with panic attacks    Generalized headaches    Head injury    Hearing loss    Heart abnormality    Heart murmur    HTN    Hypothyroid    Joint pain    Kidney stones    Memory loss    Menorrhagia    Migraines    Nausea    Osteoarthritis    Other chest pain    Overweight(278.02)    Pain    Pneumonia    Poor balance    Pulmonary embolism (CMS-HCC)    Rapid palpitations    Seasonal allergic reaction    Sleep disorder  Spinal headache    Spinal stenosis    Stroke aborted by administration of thrombolytic agent (CMS-HCC)    Thyroid disorder    Tobacco abuse        Surgical History:   Procedure Laterality Date    HYSTERECTOMY  2005    uterus + 1 ovary removed    HX KNEE ARTHROSCOPY Left 03/2014    and Dec 2015    TOTAL THYROIDECTOMY, POSSIBLE CENTRAL NECK LYMPH NODE DISSECTION, INTRAOPERATIVE RECURRENT LARYNGEAL NERVE MONITORING, INTRAOPERATIVE PARATHYROID HORMONE MONITORING, PARATHYROIDECTOMY N/A 08/06/2014    Performed by Bobetta Coy, MD at [facility] OR    UPPER GASTROINTESTINAL ENDOSCOPY  11/03/2015    HX CHOLECYSTECTOMY  04/29/2017    ESOPHAGOGASTRODUODENOSCOPY WITH BIOPSY - FLEXIBLE N/A 06/10/2023    Performed by Derril Both, MD at [facility] OR    ESOPHAGOGASTRODUODENOSCOPY WITH BIOPSY - FLEXIBLE N/A 09/17/2023    Performed by Derril Both, MD at [facility] OR    HX CESAREAN SECTION      HX HYSTERECTOMY      [facility] did it    HX KNEE ARTHROSCOPY Right 1990's    HX SURGERY      neck area, chiari malformation    HX SURGERY      abdominal sx    HX THYROIDECTOMY      HX TUBAL LIGATION      KNEE SURGERY      Both knee have been scoped    OOPHORECTOMY      remaining ovary removed        Objective:          acetaminophen (TYLENOL PO) Take  by mouth.    albuterol  sulfate (PROAIR  HFA) 90 mcg/actuation HFA aerosol inhaler Inhale two puffs by mouth into the lungs every 6 hours as needed for Wheezing or Shortness of Breath.    budesonide -formoterol  HFA (SYMBICORT ) 80-4.5 mcg/actuation aerosol inhaler Inhale two puffs by mouth into the lungs twice daily.    EPINEPHrine(+) (EPIPEN) 1 mg/mL injection pen Inject 0.3 mL into the muscle once as needed.    esomeprazole  DR (NEXIUM ) 40 mg capsule Take one capsule by mouth daily before breakfast. Take on an empty stomach at least 1 hour before or 2 hours after food.    FLUoxetine (PROZAC) 20 mg capsule TAKE 1 CAPSULE BY MOUTH IN THE EVENING    furosemide (LASIX) 20 mg tablet Take two tablets by mouth daily. 40MG     ibuprofen (MOTRIN) 800 mg tablet     levothyroxine  (SYNTHROID ) 137 mcg tablet Take one tablet by mouth daily 30 minutes before breakfast. Indications: a condition with low thyroid hormone levels    lidocaine  (LIDODERM ) 5 % topical patch Apply one patch topically to affected area every 24 hours.    magnesium  chloride (SLOW-MAG) 71.5 mg EC tablet Take one tablet by mouth daily.    ondansetron (ZOFRAN ODT) 4 mg rapid dissolve tablet     phentermine (ADIPEX-P) 37.5 mg tablet Take one tablet by mouth daily.    potassium chloride  (KLOR-CON  SPRINKLE) 10 mEq capsule Take one capsule by mouth daily.    prazosin  (MINIPRESS ) 2 mg capsule Take one capsule by mouth at bedtime daily.    pregabalin  (LYRICA ) 75 mg capsule Take one capsule by mouth twice daily.    tirzepatide (weight loss) (ZEPBOUND) 10 mg/0.5 mL injection PEN Inject 0.5 mL under the skin every 7 days.     There were no vitals filed for this visit.    There is no height or  weight on file to calculate BMI.     Physical Exam    Limited due to tele appointment     AAO*3, no aphasia, no dysathria  EOMI, face symmetric   Moving all four extremities          Assessment and Plan: Tina [name] is a 53 y.o. female with weakness/falls and symcope/seizure like activity.       Impression     # Functional Neurological Disorder with mixed symptoms     # Seizure like activity/Syncope - scheduled for autonomic testing       Diagnosis discussed in detail with patient.     Functional Neurological disorder (FND) is a neurological condition in which a person experiences a range of physical symptoms that appear to be caused by a neurological disorder, but are not due to an underlying neurological or medical condition. Instead, FND is thought to be related to physical pain such as Diffuse Myofacial Pain Syndrome, psychological or emotional factors, such as distress/frustration from the symptoms itself, stress or trauma and hyperactivity within amygdala. The symptoms of FND can be distressing and disabling, but with appropriate management and treatment, most people can recover.     Symptoms of FND can include a variety of abnormal movements or sensations that can affect any part of the body, including the limbs, face, and trunk. These symptoms can include:     Tremors or shaking  Jerky movements  Difficulty with coordination or balance  Weakness or paralysis  Non-epileptic seizures or convulsions   Gait difficulties or unsteadiness  Speech difficulties or stuttering  Vision problems  Sensory disturbances, such as numbness or tingling.  Cognitive complaints like Brain Fog - difficulty with making new memory, difficulty with finding the right word, difficulty with attention, Difficulty with multitasking   Muscle Spasm       Treatment approaches are directed towards - 1) decreasing physical pain, 2) decreasing emotional distress and 3) decreasing size of amygdala and activity within the amygdala      How to decrease physical pain like resulting from Diffuse Myofacial Pain Syndrome - through medications like Effexor, Pregablin and others, through procedures like botox, Trigger point injections, through CBT (cognitive physical therapy), through physical therapy.     How to decrease emotional distress/frustrations from the symptoms - 1) through self therapy (understanding physiology behind Functional Neurological Disorder and conveying this information to onself again and again), 2) through cognitive behavioral therapy 3) mindfulness and relaxation techniques such as  Yoga, Meditation     How to decreasing size of amygdala and activity within the amygdala  - refer below links from Professor Camie Peru at [place] - through mindfulness techniques such as Yoga and Meditation     Therapy: Many patients with FNDs or movement disorders have found that therapy can be helpful in managing their symptoms and improving their quality of life. Cognitive-behavioral therapy (CBT) is a type of therapy that helps patients identify and change negative thought patterns and behaviors that contribute to their symptoms. Acceptance and commitment therapy (ACT) is another type of therapy that can help patients cope with difficult emotions and build resilience. Some patients may also benefit from family or couples therapy, which can improve communication and support among family members.    Worrying about symptoms can become a vicious cycle. When you worry or stress about your symptoms, your body may respond by actually increasing or intensifying these symptoms, which in turn can lead to more worry and stress. This can  create a loop of continuous symptom increase and worry, which can be hard to break.    The mind-body connection is quite powerful. Our emotions and thoughts can influence our physical health. Here's how it works:    1) Museum/gallery conservator Response: Worrying and stress trigger a physiological reaction known as the fight or flight response, which is designed to help us  respond to threats. This response causes changes in the body such as increased heart rate, rapid breathing, and increased muscle tension. In the context of FND, this can exacerbate symptoms or lead to the development of new ones.    2) Brain Impact: Chronic worrying can affect the brain's function, keeping it in a constant state of high emotionality and stress. This state can make physical symptoms more intense and harder to manage.    3) Muscle Tension: The physical state of anxiety often involves increased muscle tension, which can amplify symptoms like pain, difficulty moving, and tremors.       Medication: Pregablin 75 mg BID      Lifestyle modifications: Some patients have found that making lifestyle modifications can be helpful in managing their symptoms. For example, stress reduction techniques like meditation or yoga can be helpful for patients with FNDs or movement disorders that are triggered by stress. Regular exercise, a healthy diet, and adequate sleep can also be helpful for managing symptoms and improving overall health.     Support groups and community: Many patients with FNDs or movement disorders have found support and community through [employer], local support groups, or social media groups. Connecting with others who share similar experiences can be helpful in reducing feelings of isolation and improving mental health.     Education: Learning about their condition and its management can be empowering for patients with FNDs or movement disorders. By understanding the causes and triggers of their symptoms, patients can take a more active role in their treatment and management. Healthcare providers can provide educational materials or recommend reputable online resources to patients and their families    Mindfulness-based interventions can be beneficial in the management and treatment of functional neurological disorder (FND). Mindfulness involves bringing one's attention to the present moment, cultivating non-judgmental awareness, and developing a compassionate and accepting attitude towards one's experiences. Here's how mindfulness can be helpful in FND:    1) Stress reduction: FND symptoms can be influenced by stress and emotional factors. Mindfulness practices, such as meditation and deep breathing exercises, can help reduce stress levels and promote relaxation, potentially leading to a decrease in symptom severity.    2) Body awareness: Mindfulness practices encourage individuals to develop a heightened sense of body awareness. This can help individuals with FND notice subtle sensations and changes in their bodies, allowing them to better understand their symptoms and respond to them in a more adaptive way.    3) Emotional regulation: FND is often associated with emotional distress and difficulties in emotion regulation. Mindfulness practices can enhance emotional awareness and provide tools for effectively managing and regulating emotions, reducing the impact of emotional triggers on symptoms.    4) Coping with uncertainty: FND can be characterized by unpredictable symptoms and uncertainty. Mindfulness can help individuals cultivate an accepting and non-judgmental attitude towards their experiences, allowing them to navigate uncertainty with greater resilience and flexibility.    5) Enhanced self-care: Mindfulness practices promote self-care and self-compassion, encouraging individuals with FND to prioritize their well-being, engage in self-care activities, and approach themselves with kindness and understanding.  I understand that you're experiencing symptoms that are challenging and have been attributed to Functional Neurological Disorders (FND). It's important for me to emphasize that while these symptoms may be associated with 1) physical pain such as Diffuse Myofacial Pain Syndrome, 2) (and/or) psychological or emotional factors, such as distress/frustration from the symptoms itself, stress or trauma and 3) and hyperactivity within amygdala, they are absolutely real and can significantly impact your daily life. As a neurologist, I want to assure you that your symptoms are valid and deserving of attention and care.    Functional Neurological Disorders involve a complex interaction between the brain and the body, resulting in various physical and sensory symptoms from physiological (not pathological) activation and inhibition of certain brain areas. These symptoms can be very disabling, affecting your ability to perform daily activities and impacting your overall well-being.    It's crucial to recognize that (physical pain such as Diffuse Myofacial Pain Syndrome,  psychological or emotional factors, such as distress/frustration from the symptoms itself, stress or trauma and yperactivity within amygdala) can play a role in triggering or exacerbating these symptoms. However, it doesn't mean that the symptoms are all in your head or not genuine. Stress can have a profound impact on the body, including the nervous system, and can manifest in various physical ways.    As your neurologist, I want you to know that I genuinely understand the distress and challenges you're facing. I believe in your experiences, and I'm committed to working with you to find the best possible solutions to manage and alleviate your symptoms.     Your symptoms are not a reflection of weakness or lack of effort on your part. They are an indication of the complexity of the mind-body connection and the individuality of each person's response to stress.    Please remember that you are not alone in this journey. Alongside your healthcare team, we will provide you with the support and guidance needed to improve your quality of life and help you regain control over your symptoms.        Calm app - guided mindfulness        Total time 45 minutes.  Preparing to see the patient, obtaining and/or reviewing separately obtained history, performing a medically appropriate examination and/or evaluation, counseling and educating the patient/family/caregiver, ordering medications, tests or procedures, documenting clinical information in the electronic health record, independently interpreting results (not separately reported) and communicating results to the patient/family/caregiver, care coordination.

## 2023-11-16 ENCOUNTER — Encounter: Admit: 2023-11-16 | Discharge: 2023-11-16 | Payer: MEDICAID | Primary: General Practice

## 2023-11-16 DIAGNOSIS — R0683 Snoring: Principal | ICD-10-CM

## 2023-11-16 NOTE — Progress Notes
 Patient does not need a follow up with a Sleep provider at this time.

## 2023-11-18 ENCOUNTER — Encounter: Admit: 2023-11-18 | Discharge: 2023-11-18 | Payer: MEDICAID | Primary: General Practice

## 2023-11-28 ENCOUNTER — Encounter: Admit: 2023-11-28 | Discharge: 2023-11-28 | Payer: MEDICAID | Primary: General Practice

## 2023-12-25 ENCOUNTER — Encounter: Admit: 2023-12-25 | Discharge: 2023-12-25 | Payer: MEDICAID | Primary: General Practice

## 2024-01-01 ENCOUNTER — Encounter: Admit: 2024-01-01 | Discharge: 2024-01-01 | Payer: MEDICAID | Primary: General Practice

## 2024-01-01 ENCOUNTER — Ambulatory Visit: Admit: 2024-01-01 | Discharge: 2024-01-02 | Payer: MEDICAID | Primary: General Practice

## 2024-01-01 DIAGNOSIS — I890 Lymphedema, not elsewhere classified: Secondary | ICD-10-CM

## 2024-01-01 DIAGNOSIS — R609 Edema, unspecified: Principal | ICD-10-CM
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
## 2024-01-01 NOTE — Patient Instructions [37]
 Follow-Up:    -Thank you for allowing us  to participate in your care today. Your After Visit Summary is being completed by Laneta Payor, RN.    -We would like you to follow up in as needed with Laurina Peaks, MD  -The schedule is released approximately 4-5 months in advance. You will be called by our scheduling department to make an appointment and you will also receive a notification via MyChart to self-schedule.  However, if you would like to call to make this appointment, please call [phone].    Contacting our office:    -Business Hours: Monday-Friday, 8:00 am-4:30 pm (excluding Holidays).     -For medical questions or concerns, please send us  a message through your MyChart account or call the [facility] team nursing triage line at [phone]. Please leave a detailed message with your name, date of birth, and reason for your call.  If your message is received before 3:30pm, every effort will be made to call you back the same day.  Please allow time for us  to review your chart prior to call back.     -For medication refills please start by contacting your pharmacy. You can also send us  a prescription question through your MyChart or call the nurse triage line above.     -Please allow a minimum of 10-14 business days for clearance from your cardiologist for procedures and/or FMLA, disability, or  DOT forms. An office visit or testing may be required for us  to provide clearance.     Al nursing team fax number: [phone]    -You may receive a survey in the upcoming weeks from [facility]. Your feedback is important to us  and helps us  continue to improve patient care and patient satisfaction.     -Please feel free to call our Financial Department at [phone] with any questions or concerns about estimated cost of testing or imaging ordered today. We are happy to provide CPT codes upon request.    Results & Testing Follow Up:    -Please allow 5-7 business days for the results of any testing to be reviewed. Please call our office if you have not heard from a nurse within this time frame.    -Should you choose to complete testing at an outside facility, please contact our office after completion of testing so that we can ensure that we have received results for your provider to review.    Lab and test results:  As a part of the CARES act, starting 06/04/2019, some results will be released to you via MyChart immediately and automatically.  You may see results before your provider sees them; however, your provider will review all these results and then they, or one of their team, will notify you of result information and recommendations.   Critical results will be addressed immediately, but otherwise, please allow us  time to get back with you prior to you reaching out to us  for questions.  This will usually take about 72 hours for labs and 5-7 days for procedure test results.

## 2024-01-22 ENCOUNTER — Encounter: Admit: 2024-01-22 | Discharge: 2024-01-22 | Payer: MEDICAID | Primary: General Practice

## 2024-01-24 ENCOUNTER — Encounter: Admit: 2024-01-24 | Discharge: 2024-01-24 | Payer: MEDICAID | Primary: General Practice

## 2024-01-25 ENCOUNTER — Encounter: Admit: 2024-01-25 | Discharge: 2024-01-25 | Payer: MEDICAID | Primary: General Practice

## 2024-01-28 ENCOUNTER — Encounter: Admit: 2024-01-28 | Discharge: 2024-01-28 | Payer: MEDICAID | Primary: General Practice

## 2024-02-12 ENCOUNTER — Encounter: Admit: 2024-02-12 | Discharge: 2024-02-12 | Payer: MEDICAID | Primary: General Practice

## 2024-02-12 ENCOUNTER — Ambulatory Visit: Admit: 2024-02-12 | Discharge: 2024-02-12 | Payer: MEDICAID | Primary: General Practice

## 2024-02-12 VITALS — BP 105/75 | HR 103 | Temp 97.80000°F | Resp 16 | Ht 67.0 in | Wt 161.0 lb

## 2024-02-12 DIAGNOSIS — J4540 Moderate persistent asthma, uncomplicated: Principal | ICD-10-CM

## 2024-02-12 DIAGNOSIS — K21 Gastroesophageal reflux disease with esophagitis without hemorrhage: Secondary | ICD-10-CM

## 2024-02-12 NOTE — Progress Notes [1]
 Outpatient Pulmonary Medicine Note  Date of Service: 02/12/2024   Patient's PCP: Tina [name]      Reason For Consult: Asthma   Reason For Today's Visit: 42-month follow up      History of Present Illness   Timeline based history & pertinent information  Tina [name] is a 53 y.o. female with neurocardiogenic syncope who has been following up in our clinic for the management of asthma. Had an MRI-negative stroke (got TNK) in 11/2022. She has no pets, no mold or water  leaks. No changes in symptoms with weather or extreme temperature changes. She does snore and does wakeup a few times to catch her breath. She is a former smoker.     07/2022  Significant improvement in respiratory symptoms. Only uses pulmicort  as-needed (~2x/week). No ongoing triggers. States that due to abdominal issues, all her medical weight loss Rx are on hold.     11/2022  Admitted to Neuro-ICU for concern for stroke - got TNK. MRI without evidence of stroke. 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 Dc'd phentermine and Topamax     01/2023  Uses her AirSupra  roughly twice a day. No wheezing or worsening dyspnea.      07/2023  Seen by Dr. Emilia. Continues to use AirSupra  BID (PRN). Discussed transitioning to maintenance inhalers but she prefers to stay on PRN. Worried that she may need supplemental oxygen, particularly at night. Nocturnal oximetry was with minimal abnormality (screenshot below).    10/2023  Frequent use of her rescue albuterol  inhaler. Was switched to low-dose ICS/LABA. Due to her concerns for needing supplemental oxygen and a very brief transient nocturnal desaturation, we proceeded with an in-lab polysomnogram split study, which was normal.    02/2024  Lost 30 lbs on Zepbound. Ex-Ox today was normal (8 minutes total, no desaturations). Shares that she tested positive for COVID at an UC in [place], but didn't receive treatment. In terms of her asthma, she remains on SMART with Breyna .         Vital Signs and Physical Exam     Vitals: 02/12/24 1150   BP: 105/75   BP Source: Arm, Left Upper   Pulse: 103   Temp: 36.6 ?C (97.8 ?F)   Resp: 16   SpO2: 100%   TempSrc: Oral   Weight: 73 kg (161 lb)   Height: 170.2 cm (5' 7)     Wt Readings from Last 4 Encounters:   02/12/24 73 kg (161 lb)   01/01/24 80.5 kg (177 lb 6.4 oz)   11/28/23 85.3 kg (188 lb)   10/23/23 85.7 kg (189 lb)      Physical Exam  Vitals reviewed.   Constitutional:       General: She is not in acute distress.  HENT:      Head: Normocephalic.   Eyes:      General: No scleral icterus.  Cardiovascular:      Rate and Rhythm: Normal rate.   Pulmonary:      Effort: Pulmonary effort is normal. No respiratory distress.   Musculoskeletal:      Cervical back: Neck supple.   Skin:     General: Skin is dry.   Neurological:      Mental Status: She is alert. Mental status is at baseline.           Labs, PFT, Current Medications, and Imaging     Labs:   Lab Results   Component Value Date/Time    HGB 13.1 07/16/2023 12:00 AM  WBC 5.75 07/16/2023 12:00 AM    PLTCT 247 07/16/2023 12:00 AM    CO2 27.0 07/16/2023 12:00 AM    CR 0.95 07/16/2023 12:00 AM    AEC 0.17 11/18/2022 02:06 AM    IGE 184 (H) 02/04/2019 03:27 PM    HGBA1C 5.6 11/17/2022 03:03 PM        Pulmonary Function Test:   Complete PFT Absolute FVC-Pre FEV1-Pre DLCOunc-%Pred-Pre   Latest Ref Rng & Units L L %   02/04/2019  11:44 AM 4.14  3.66  118    06/04/2019   3:01 PM 4.15  3.52     01/09/2023   3:32 PM 4.29  3.68        Complete PFT Percent Predicted FVC-%Pred-pre FEV1-%Pred-Pre DLCOunc-%Pred-Pre   Latest Ref Rng & Units % % %   02/04/2019  11:44 AM 108  120  118    06/04/2019   3:01 PM 108  115     01/09/2023   3:32 PM 121  130          PFT 6/18 [facility])  FVC      4.15 (110%) -> 4.11 (109%)  FEV1    3.71 (123%) -> 3.55 (118%)  Ratio     89% -> 86%  RV        1.66 (90%)  TLC       5.8 (109%)  DLCO    95%     Methacholine 4/21  FEV1 dec. by 21% after 4 mg/mL (mild AHR)    Spirometry 11/24  FVC4.29L, 121%  FEV13.68L, 130%  Ratio 86%    5/25  385 meters, no desaturations    Current Medication List:   albuterol  sulfate  budesonide -formoterol  HFA  EPINEPHrine  esomeprazole  DR  FLUoxetine  furosemide  ibuprofen  levothyroxine   lidocaine   mupirocin  ondansetron  phentermine  potassium chloride   prazosin   pregabalin   SLOW-MAG Tbec  TYLENOL PO  ZEPBOUND     Radiology and other diagnostic tests:   I personally reviewed all images pertaining to this consult.  - CXR 10/2018 [facility]) - normal  - TTE 06/2022: LV and RV size and function are normal. LVEF 58%. G1DD.          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 female, former smoker, with T2-high asthma, on low-dose ICS/LABA with good control of symptoms and rarely uses her albuterol  inhaler. Most recent spirometry and Ex-Ox were within normal limits. We discussed that she can use her Breyna  as a single maintenance and reliever therapy moving forward.         RTC in 12 months. Discussed & evaluated with Dr. Alan [name].    Tina [name] MBBS  PGY-7 - Pulmonary & Critical Care     Voice recognition software was used for this dictation. Every attempt was made to edit but small errors may persist. Please call me directly if any concerns arise.

## 2024-02-12 NOTE — Patient Instructions [37]
 It was a pleasure seeing you today.      My nurse is Clemens Catholic, Charity fundraiser.  She can be reached at [phone].    Please contact my nurse with any signs and symptoms of worsening productive cough with thick secretions, blood in sputum, chest pain or tightness, shortness of breath, fever, chills, night sweats, or any questions or concerns.    For refills on medications, please have your pharmacy request a refill authorization either electronically or via fax to our office at [phone]. Please allow at least 3 business days for refill requests.    For urgent issues after business hours, weekends, or holidays please call [phone] and request for the pulmonary fellow to be paged.    If you need to cancel or reschedule an appointment, please call [phone].

## 2024-02-13 ENCOUNTER — Encounter: Admit: 2024-02-13 | Discharge: 2024-02-13 | Payer: MEDICAID | Primary: General Practice

## 2024-02-25 ENCOUNTER — Encounter: Admit: 2024-02-25 | Discharge: 2024-02-25 | Payer: MEDICAID | Primary: General Practice

## 2024-02-25 DIAGNOSIS — I10 Essential (primary) hypertension: Principal | ICD-10-CM

## 2024-02-25 DIAGNOSIS — R55 Syncope and collapse: Secondary | ICD-10-CM

## 2024-03-01 ENCOUNTER — Encounter: Admit: 2024-03-01 | Discharge: 2024-03-01 | Payer: MEDICAID | Primary: General Practice

## 2024-03-11 LAB — TSH WITH FREE T4 REFLEX: TSH: 0.4

## 2024-03-12 ENCOUNTER — Encounter: Admit: 2024-03-12 | Discharge: 2024-03-12 | Payer: MEDICAID | Primary: General Practice

## 2024-03-18 ENCOUNTER — Encounter: Admit: 2024-03-18 | Discharge: 2024-03-18 | Payer: MEDICAID | Primary: General Practice

## 2024-03-20 ENCOUNTER — Encounter: Admit: 2024-03-20 | Discharge: 2024-03-20 | Payer: MEDICAID | Primary: General Practice

## 2024-03-31 ENCOUNTER — Encounter: Admit: 2024-03-31 | Discharge: 2024-03-31 | Payer: MEDICAID | Primary: General Practice

## 2024-04-06 ENCOUNTER — Encounter: Admit: 2024-04-06 | Discharge: 2024-04-06 | Payer: MEDICAID | Primary: General Practice

## 2024-04-06 DIAGNOSIS — E039 Hypothyroidism, unspecified: Principal | ICD-10-CM
